# Patient Record
Sex: Male | Born: 1955 | Race: White | Hispanic: No | Marital: Married | Smoking: Former smoker
Health system: Southern US, Community
[De-identification: ages and names within clinical notes are randomized; demographics above are authoritative.]

## PROBLEM LIST (undated history)

## (undated) DIAGNOSIS — I10 Essential (primary) hypertension: Secondary | ICD-10-CM

## (undated) DIAGNOSIS — R0789 Other chest pain: Secondary | ICD-10-CM

## (undated) DIAGNOSIS — E785 Hyperlipidemia, unspecified: Secondary | ICD-10-CM

## (undated) DIAGNOSIS — Z9289 Personal history of other medical treatment: Secondary | ICD-10-CM

## (undated) DIAGNOSIS — N529 Male erectile dysfunction, unspecified: Secondary | ICD-10-CM

## (undated) DIAGNOSIS — Z9989 Dependence on other enabling machines and devices: Secondary | ICD-10-CM

## (undated) DIAGNOSIS — G4733 Obstructive sleep apnea (adult) (pediatric): Secondary | ICD-10-CM

## (undated) HISTORY — DX: Dependence on other enabling machines and devices: Z99.89

## (undated) HISTORY — DX: Male erectile dysfunction, unspecified: N52.9

## (undated) HISTORY — DX: Other chest pain: R07.89

## (undated) HISTORY — DX: Personal history of other medical treatment: Z92.89

## (undated) HISTORY — DX: Obstructive sleep apnea (adult) (pediatric): G47.33

## (undated) HISTORY — DX: Essential (primary) hypertension: I10

## (undated) HISTORY — DX: Hyperlipidemia, unspecified: E78.5

---

## 2002-06-17 LAB — PULMONARY FUNCTION TEST

## 2005-01-09 ENCOUNTER — Ambulatory Visit (HOSPITAL_COMMUNITY): Admission: RE | Admit: 2005-01-09 | Discharge: 2005-01-09 | Payer: Self-pay | Admitting: Family Medicine

## 2009-11-07 ENCOUNTER — Ambulatory Visit (HOSPITAL_COMMUNITY): Admission: RE | Admit: 2009-11-07 | Discharge: 2009-11-07 | Payer: Self-pay | Admitting: General Surgery

## 2010-08-03 NOTE — Procedures (Signed)
NAMERAYNARD, MAPPS             ACCOUNT NO.:  [id number]   MEDICAL RECORD NO.:  [id number]          PATIENT TYPE:  OUT   LOCATION:                                FACILITY:  [facility]   PHYSICIAN:  Donna Bernard, M.D.DATE OF BIRTH:  [birth year]   DATE OF PROCEDURE:  DATE OF DISCHARGE:                                    STRESS TEST   DATE OF TEST:  January 09, 2005.   INDICATION FOR TEST:  Atypical chest pain and exertional dyspnea with  multiple risk factors.   STRESS TEST:  The stress test was performed at standard [facility] protocol.  Resting EKG revealed normal sinus rhythm with no significant ST-T changes.  The patient tolerated the first three stages quite well.  As he reached the  third stage, his heart rate surpassed his submax predicted heart rate and  achieved a maximum of 154.  At this point, at 0.08 seconds past the J point,  there was ST segment depression in II, III and AVF; however, the slope of  the segment was sharply ascending.  The patient actually made it into the  fourth stage.  He had normal hypertensive responses expected.  During rest,  the atypical ST segment changes returned to normal quickly.  The patient  experienced no chest pain or excessive dyspnea with the test.   IMPRESSION:  Negative adequate stress test.   PLAN:  Exercise encouraged.      Donna Bernard, M.D.  Electronically Signed     WSL/MEDQ  D:  01/30/2005  T:  01/30/2005  Job:  [id number]

## 2011-10-04 DIAGNOSIS — Z9289 Personal history of other medical treatment: Secondary | ICD-10-CM

## 2011-10-04 HISTORY — DX: Personal history of other medical treatment: Z92.89

## 2012-09-25 ENCOUNTER — Encounter: Payer: Self-pay | Admitting: Cardiovascular Disease

## 2012-09-25 ENCOUNTER — Encounter: Payer: Self-pay | Admitting: *Deleted

## 2012-09-28 ENCOUNTER — Encounter: Payer: Self-pay | Admitting: Cardiovascular Disease

## 2012-09-28 ENCOUNTER — Ambulatory Visit (INDEPENDENT_AMBULATORY_CARE_PROVIDER_SITE_OTHER): Payer: BC Managed Care – HMO | Admitting: Cardiovascular Disease

## 2012-09-28 VITALS — BP 138/88 | Ht 69.0 in | Wt 246.0 lb

## 2012-09-28 DIAGNOSIS — E8881 Metabolic syndrome: Secondary | ICD-10-CM

## 2012-09-28 DIAGNOSIS — E669 Obesity, unspecified: Secondary | ICD-10-CM | POA: Insufficient documentation

## 2012-09-28 DIAGNOSIS — Z8249 Family history of ischemic heart disease and other diseases of the circulatory system: Secondary | ICD-10-CM

## 2012-09-28 DIAGNOSIS — E782 Mixed hyperlipidemia: Secondary | ICD-10-CM

## 2012-09-28 DIAGNOSIS — G4733 Obstructive sleep apnea (adult) (pediatric): Secondary | ICD-10-CM

## 2012-09-28 DIAGNOSIS — R0789 Other chest pain: Secondary | ICD-10-CM

## 2012-09-28 DIAGNOSIS — Z9989 Dependence on other enabling machines and devices: Secondary | ICD-10-CM

## 2012-09-28 MED ORDER — SIMVASTATIN 20 MG PO TABS: 20.0000 mg | ORAL_TABLET | Freq: Every evening | ORAL | Status: DC

## 2012-09-28 NOTE — Progress Notes (Signed)
Patient ID: SAFIR Stephen Walton, male   DOB: 1955/06/07, 57 y.o.   MRN: [id number]     HPI: Stephen Walton, is a 57 y.o. male who presents for followup chronologic evaluation. 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 He has a history of obesity, sleep apnea on CPAP therapy, and last summer developed episodes of chest pain with some atypical features. This occurred shortly after his brother suffered a cardiac arrest. An echo Doppler study revealed mild concentric LVH with normal systolic and diastolic function. He did have mild aortic valve sclerosis without stenosis. A nuclear perfusion study done on 10/04/2011 showed normal perfusion.  He is felt to have probable metabolic syndrome. I did perform an NMR profile in March 2014 which showed an increase LDL particle number at 1647 with an LDL of 132. He had 736 small LDL particles. His insulin resistance was 53. HDL was 53. Normal HDL particle #37.2. He has been on simvastatin 20 mg since that time. Subsequent blood work showed marked improvement with a total cholesterol 146 triglycerides 114 HDL 49 LDL cholesterol 74. [name] walks at least 5 miles daily. However, despite this has been difficult for him  to lose weight. He denies recurrent chest pain. He is in need for a new supply company for CPAP treatment.  Past Medical History  Diagnosis Date  . OSA on CPAP   . H/O echocardiogram 10/04/2011    EF >55%;mild concentric LVH; normals systolic & diastolic dysfunction; mild aortic stenosis  . History of nuclear stress test 10/04/2011    low risk; small amount of basal inferior bowel artifact  . Chest pain, atypical   . Hyperlipidemia     No past surgical history on file.  No Known Allergies  Current Outpatient Prescriptions  Medication Sig Dispense Refill  . aspirin 81 MG tablet Take 81 mg by mouth daily.      . fish oil-omega-3 fatty acids 1000  MG capsule Take 1 g by mouth daily.      . NON FORMULARY at bedtime. CPAP      . simvastatin (ZOCOR) 20 MG tablet Take 20 mg by mouth every evening.      . TESTOSTERONE IM Inject into the muscle every 21 ( twenty-one) days.       No current facility-administered medications for this visit.    Socially  ROS is negative for fevers, chills or night sweats.   Other system review is negative.  PE BP 138/88  Ht 5\' 9"  (1.753 m)  Wt 246 lb (111.585 kg)  BMI 36.31 kg/m2  General: Alert, oriented, no distress.  Skin: normal turgor, no rashes HEENT: Normocephalic, atraumatic. Pupils round and reactive; sclera anicteric;no lid lag.  Nose without nasal septal hypertrophy Mouth/Parynx benign; Mallinpatti scale 3 Neck: No JVD, no carotid briuts Lungs: clear to ausculatation and percussion; no wheezing or rales Heart: RRR, s1 s2 normal;1/6 systolic murmur Abdomen: soft, nontender; no hepatosplenomehaly, BS+; abdominal aorta nontender and not dilated by palpation. Pulses 2+ Extremities: no clubbing cyanosis or edema, Homan's sign negative  Neurologic: grossly nonfocal  ECG: Sinus rhythm at 61 beats per minute; normal intervals  LABS:  BMET No results found for this basename: na, k, cl, co2, glucose, bun, creatinine, calcium, gfrnonaa, gfraa     Hepatic Function Panel  No results found for this basename: prot, albumin, ast, alt, alkphos, bilitot, bilidir, ibili     CBC  No results found for this basename: wbc, rbc, hgb, hct, plt, mcv, mch, mchc, rdw, neutrabs, lymphsabs, monoabs, eosabs, basosabs     BNP No results found for this basename: probnp    Lipid Panel  No results found for this basename: chol, trig, hdl, cholhdl, vldl, ldlcalc     RADIOLOGY: No results found.    ASSESSMENT AND PLAN: [name] continues to do well. He has demonstrated significant improvement in his lipid studies with the initiation of low-dose simvastatin at 20 mg per he does have insulin  resistance and metabolic syndrome. We discussed again further attempts at weight loss. I given him contact information and CPAP supplies and he will initiate this with home oxygen since his daughter had worked with the home oxygen wrap, may consider [employer] [facility] homecare he has been working with a nutritionist the ostium in 6 months for followup evaluation.     Stephen Bihari, MD, [facility]  09/28/2012 12:15 PM

## 2012-09-28 NOTE — Patient Instructions (Addendum)
Your physician recommends that you schedule a follow-up appointment in: 6 MONTHS. No changes has been made in your therapy today. 

## 2012-12-28 ENCOUNTER — Telehealth: Payer: Self-pay | Admitting: Pharmacist Clinician (PhC)/ Clinical Pharmacy Specialist

## 2012-12-28 NOTE — Telephone Encounter (Signed)
Opened in error

## 2013-04-26 ENCOUNTER — Ambulatory Visit (INDEPENDENT_AMBULATORY_CARE_PROVIDER_SITE_OTHER): Payer: BC Managed Care – PPO | Admitting: Cardiovascular Disease

## 2013-04-26 ENCOUNTER — Encounter: Payer: Self-pay | Admitting: Cardiovascular Disease

## 2013-04-26 VITALS — BP 112/80 | HR 69 | Ht 69.0 in | Wt 250.6 lb

## 2013-04-26 DIAGNOSIS — Z9989 Dependence on other enabling machines and devices: Principal | ICD-10-CM

## 2013-04-26 DIAGNOSIS — G4733 Obstructive sleep apnea (adult) (pediatric): Secondary | ICD-10-CM

## 2013-04-26 DIAGNOSIS — E669 Obesity, unspecified: Secondary | ICD-10-CM

## 2013-04-26 DIAGNOSIS — E785 Hyperlipidemia, unspecified: Secondary | ICD-10-CM

## 2013-04-26 DIAGNOSIS — E8881 Metabolic syndrome: Secondary | ICD-10-CM

## 2013-04-26 NOTE — Patient Instructions (Signed)
No changes were made today in your therapy.  Your physician recommends that you schedule a follow-up appointment in: 6 months.

## 2013-04-30 ENCOUNTER — Encounter: Payer: Self-pay | Admitting: Cardiovascular Disease

## 2013-04-30 DIAGNOSIS — E785 Hyperlipidemia, unspecified: Secondary | ICD-10-CM | POA: Insufficient documentation

## 2013-04-30 NOTE — Progress Notes (Signed)
Patient ID: HAWLEY MICHEL, male   DOB: 05-Sep-1955, 58 y.o.   MRN: [id number]      HPI: CONCEPCION KIRKPATRICK, is a 58 y.o. male who presents for followup 6 month cardiology evaluation.   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 He has a history of obesity, sleep apnea on CPAP therapy, and lin 2013 developed episodes of chest pain with some atypical features. This occurred shortly after his brother suffered a cardiac arrest. An echo Doppler study revealed mild concentric LVH with normal systolic and diastolic function. He did have mild aortic valve sclerosis without stenosis. A nuclear perfusion study done on 10/04/2011 showed normal perfusion.  He is felt to have probable metabolic syndrome. In March 2014 an NMR lipoprofile showed an increase LDL particle number at 1647 with an LDL of 132. He had 736 small LDL particles. His insulin resistance was 53. HDL was 53. Normal HDL particle #37.2. He has been on simvastatin 20 mg since that time. Subsequent blood work showed marked improvement with a total cholesterol 146 triglycerides 114 HDL 49 LDL cholesterol 74. Mr. Vonbargen walks at least 5 miles daily. However, despite this has been difficult for him  to lose weight. He denies recurrent chest pain.   When I saw him last year, he was continuing to do well he now uses [facility] for his CPAP supply company after [employer]). He is unaware of breakthrough snoring. He denies residual daytime sleepiness. He is unaware bruxism.  Past 2 years, he has gained approximately 10 additional pounds of weight despite his exercise. He has had recent laboratory done by Dr. Orson Ape in [place]. He denies recurrent episodes of chest pain. He denies palpitations. He denies presyncope or syncope.  Past Medical History  Diagnosis Date  . OSA on CPAP   . H/O echocardiogram 10/04/2011    EF >55%;mild concentric LVH; normals systolic & diastolic  dysfunction; mild aortic stenosis  . History of nuclear stress test 10/04/2011    low risk; small amount of basal inferior bowel artifact  . Chest pain, atypical   . Hyperlipidemia     History reviewed. No pertinent past surgical history.  No Known Allergies  Current Outpatient Prescriptions  Medication Sig Dispense Refill  . aspirin 81 MG tablet Take 81 mg by mouth daily.      . fish oil-omega-3 fatty acids 1000 MG capsule Take 1 g by mouth daily.      . NON FORMULARY at bedtime. CPAP      . simvastatin (ZOCOR) 20 MG tablet Take 1 tablet (20 mg total) by mouth every evening.  90 tablet  3  . TESTOSTERONE IM Inject into the muscle every 21 ( twenty-one) days.       No current facility-administered medications for this visit.    Socially he is married has 4 children. 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 There is no tobacco use having quit over 30 years ago. There is nosignificant alcohol use  ROS is negative for fevers, chills or night sweats. He admits to a 10 pound weight gain over the past 2 years. He denies change in vision or hearing. He is unaware lymphadenopathy. He denies any further chest tightness. There is no wheezing. He denies presyncope or syncope. Denies cough or increased congestion. He denies nausea vomiting or diarrhea. There is no blood in stool or urine. He denies myalgias. There are no claudication symptoms.  He is not diabetic but has metabolic syndrome. There is no tremors. There is no history of thyroid abnormality. He has been utilizing CPAP therapy without breakthrough snoring or residual daytime sleepiness.  Other comprehensive 14 point system review is negative.  PE BP 112/80  Pulse 69  Ht 5\' 9"  (1.753 m)  Wt 250 lb 9.6 oz (113.671 kg)  BMI 36.99 kg/m2  General: Alert, oriented, no distress. Moderately obese Skin: normal turgor, no rashes HEENT: Normocephalic, atraumatic. Pupils round and reactive; sclera anicteric;no lid lag. No  xanthelasma Nose without nasal septal hypertrophy Mouth/Parynx benign; Mallinpatti scale 3 Neck: No JVD, no carotid bruits with normal carotid upstroke Lungs: clear to ausculatation and percussion; no wheezing or rales Heart: RRR, s1 s2 [id number];5/6 systolic murmur; no diastolic murmur, rubs thrills or heaves Abdomen: soft, nontender; no hepatosplenomehaly, BS+; abdominal aorta nontender and not dilated by palpation. Pulses 2+ Extremities: no clubbing cyanosis or edema, Homan's sign negative  Neurologic: grossly nonfocal; cranial nerves normal Psychological: Normal affect and mood  ECG (independently read by me): Normal sinus rhythm at 69 beats per minute. No ectopy. Normal intervals.  Prior ECG of 09/28/2012: Sinus rhythm at 61 beats per minute; normal intervals  LABS:  BMET No results found for this basename: na,  k,  cl,  co2,  glucose,  bun,  creatinine,  calcium,  gfrnonaa,  gfraa     Hepatic Function Panel  No results found for this basename: prot,  albumin,  ast,  alt,  alkphos,  bilitot,  bilidir,  ibili     CBC No results found for this basename: wbc,  rbc,  hgb,  hct,  plt,  mcv,  mch,  mchc,  rdw,  neutrabs,  lymphsabs,  monoabs,  eosabs,  basosabs     BNP No results found for this basename: probnp    Lipid Panel  No results found for this basename: chol,  trig,  hdl,  cholhdl,  vldl,  ldlcalc     RADIOLOGY: No results found.    ASSESSMENT AND PLAN: Mr. 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 Weight has gained approximately 10 pounds over the past 2 years. He did see a nutritionist in the past. He does have metabolic syndrome. Presently, his blood pressure is well controlled and on repeat by me today was 120/78. He is on simvastatin 20 mg and fish oil 1000 mg daily and this has resulted in significant improvement in his lipid parameters. He's not having any further  episodes of chest pain which in the past was atypical and noncardiac. He had normal perfusion on nuclear perfusion study in July 2013. Had a long discussion with him today concerning proper diet, with reduction in carbohydrates and also discussed Mediterranean diet with him. We discussed weight: 6 months of 225 pounds with an average of 1 pound per week. This was obstructive sleep apnea, he is now using [facility] for CPAP supplies. He is compliant. He's not having significant issues with this and there is no residual daytime sleepiness or breakthrough snoring. I will obtain recent laboratory data done in [place]. I will see him in 6 months for cardiology reevaluation.  Time spent: 25 minutes  Troy Sine, MD, [facility]  04/30/2013 7:42 AM

## 2013-11-23 ENCOUNTER — Ambulatory Visit (INDEPENDENT_AMBULATORY_CARE_PROVIDER_SITE_OTHER): Payer: BC Managed Care – PPO | Admitting: Urology

## 2013-11-23 DIAGNOSIS — R972 Elevated prostate specific antigen [PSA]: Secondary | ICD-10-CM

## 2013-11-23 DIAGNOSIS — E291 Testicular hypofunction: Secondary | ICD-10-CM

## 2013-12-01 ENCOUNTER — Other Ambulatory Visit: Payer: Self-pay | Admitting: Urology

## 2013-12-01 DIAGNOSIS — R972 Elevated prostate specific antigen [PSA]: Secondary | ICD-10-CM

## 2013-12-02 ENCOUNTER — Other Ambulatory Visit: Payer: Self-pay | Admitting: Urology

## 2013-12-02 DIAGNOSIS — R972 Elevated prostate specific antigen [PSA]: Secondary | ICD-10-CM

## 2013-12-13 ENCOUNTER — Other Ambulatory Visit: Payer: Self-pay | Admitting: Cardiovascular Disease

## 2013-12-13 NOTE — Telephone Encounter (Signed)
Rx was sent to pharmacy electronically. 

## 2013-12-14 ENCOUNTER — Ambulatory Visit (HOSPITAL_COMMUNITY)
Admission: RE | Admit: 2013-12-14 | Discharge: 2013-12-14 | Disposition: A | Discharge status: Home / Self Care | Payer: BC Managed Care – PPO | Source: Ambulatory Visit | Attending: Urology | Admitting: Urology

## 2013-12-14 ENCOUNTER — Encounter (HOSPITAL_COMMUNITY): Payer: Self-pay

## 2013-12-14 VITALS — BP 151/100 | HR 70 | Temp 98.1°F | Resp 16

## 2013-12-14 DIAGNOSIS — R972 Elevated prostate specific antigen [PSA]: Secondary | ICD-10-CM | POA: Diagnosis present

## 2013-12-14 DIAGNOSIS — C61 Malignant neoplasm of prostate: Secondary | ICD-10-CM | POA: Diagnosis not present

## 2013-12-14 MED ORDER — GENTAMICIN SULFATE 40 MG/ML IJ SOLN
160.0000 mg | Freq: Once | INTRAMUSCULAR | Status: AC
  Administered 2013-12-14: 160 mg via INTRAMUSCULAR
  Filled 2013-12-14: qty 4

## 2013-12-14 MED ORDER — GENTAMICIN SULFATE 40 MG/ML IJ SOLN
INTRAMUSCULAR | Status: AC
  Administered 2013-12-14: 160 mg via INTRAMUSCULAR
  Filled 2013-12-14: qty 4

## 2013-12-14 MED ORDER — LIDOCAINE HCL (PF) 2 % IJ SOLN
10.0000 mL | Freq: Once | INTRAMUSCULAR | Status: AC
  Administered 2013-12-14: 10 mL

## 2013-12-14 MED ORDER — LIDOCAINE HCL (PF) 2 % IJ SOLN
INTRAMUSCULAR | Status: AC
  Administered 2013-12-14: 10 mL
  Filled 2013-12-14: qty 10

## 2013-12-14 NOTE — Discharge Instructions (Signed)
Transrectal Ultrasound-Guided Biopsy °A transrectal ultrasound-guided biopsy is a procedure to remove samples of tissue from your prostate using ultrasound images to guide the procedure. The procedure is usually done to evaluate the prostate gland of men who have an elevated prostate-specific antigen (PSA). PSA is a blood test to screen for prostate cancer. The biopsy samples are taken to check for prostate cancer.  °LET YOUR HEALTH CARE PROVIDER KNOW ABOUT: °· Any allergies you have. °· All medicines you are taking, including vitamins, herbs, eye drops, creams, and over-the-counter medicines. °· Previous problems you or members of your family have had with the use of anesthetics. °· Any blood disorders you have. °· Previous surgeries you have had. °· Medical conditions you have. °RISKS AND COMPLICATIONS °Generally, this is a safe procedure. However, as with any procedure, problems can occur. Possible problems include: °· Infection of your prostate. °· Bleeding from your rectum or blood in your urine. °· Difficulty urinating. °· Nerve damage (this is usually temporary). °· Damage to surrounding structures such as blood vessels, organs, and muscles, which would require other procedures. °BEFORE THE PROCEDURE °· Do not eat or drink anything after midnight on the night before the procedure or as directed by your health care provider. °· Take medicines only as directed by your health care provider. °· Your health care provider may have you stop taking certain medicines 5-7 days before the procedure. °· You will be given an enema before the procedure. During an enema, a liquid is injected into your rectum to clear out waste. °· You may have lab tests the day of your procedure.   °· Plan to have someone take you home after the procedure. °PROCEDURE  °· You will be given medicine to help you relax (sedative) before the procedure. An IV tube will be inserted into one of your veins and used to give fluids and  medicine. °· You will be given antibiotic medicine to reduce the risk of an infection. °· You will be placed on your side for the procedure. °· A probe with lubricated gel will be placed into your rectum, and images will be taken of your prostate and surrounding structures. °· Numbing medicine will be injected into the prostate before the biopsy samples are taken. °· A biopsy needle will then be inserted and guided to your prostate with the use of the ultrasound images. °· Samples of prostate tissue will be taken, and the needle will then be removed. °· The biopsy samples will be sent to a lab to be analyzed. Results are usually back in 2-3 days. °AFTER THE PROCEDURE °· You will be taken to a recovery area where you will be monitored. °· You may have some discomfort in the rectal area. You will be given pain medicines to control this. °· You may be allowed to go home the same day, or you may need to stay in the hospital overnight. °Document Released: 07/19/2013 Document Reviewed: 10/21/2012 °ExitCare® Patient Information ©2015 [employer]. This information is not intended to replace advice given to you by your health care provider. Make sure you discuss any questions you have with your health care provider. ° °

## 2013-12-16 ENCOUNTER — Encounter: Payer: Self-pay | Admitting: Cardiovascular Disease

## 2013-12-16 ENCOUNTER — Ambulatory Visit (INDEPENDENT_AMBULATORY_CARE_PROVIDER_SITE_OTHER): Payer: BC Managed Care – PPO | Admitting: Cardiovascular Disease

## 2013-12-16 VITALS — BP 130/70 | HR 77 | Ht 69.0 in | Wt 248.1 lb

## 2013-12-16 DIAGNOSIS — G4733 Obstructive sleep apnea (adult) (pediatric): Secondary | ICD-10-CM

## 2013-12-16 DIAGNOSIS — E669 Obesity, unspecified: Secondary | ICD-10-CM

## 2013-12-16 DIAGNOSIS — F419 Anxiety disorder, unspecified: Secondary | ICD-10-CM

## 2013-12-16 DIAGNOSIS — E785 Hyperlipidemia, unspecified: Secondary | ICD-10-CM

## 2013-12-16 DIAGNOSIS — E8881 Metabolic syndrome: Secondary | ICD-10-CM

## 2013-12-16 DIAGNOSIS — Z9989 Dependence on other enabling machines and devices: Principal | ICD-10-CM

## 2013-12-16 NOTE — Patient Instructions (Signed)
Your physician wants you to follow-up in: 1 year or sooner if needed. You will receive a reminder letter in the mail two months in advance. If you don't receive a letter, please call our office to schedule the follow-up appointment. No changes were made today in your therapy.

## 2013-12-18 ENCOUNTER — Encounter: Payer: Self-pay | Admitting: Cardiovascular Disease

## 2013-12-18 DIAGNOSIS — F419 Anxiety disorder, unspecified: Secondary | ICD-10-CM | POA: Insufficient documentation

## 2013-12-18 NOTE — Progress Notes (Signed)
Patient ID: JONA ZAPPONE, male   DOB: [birth year], 58 y.o.   MRN: [id number]      HPI: SUN WILENSKY is a 58 y.o. male who presents for follow-up 15  month cardiology evaluation.   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 He has a history of obesity, sleep apnea on CPAP therapy, and in 2013 developed episodes of chest pain with some atypical features. This occurred shortly after his brother suffered a cardiac arrest. An echo Doppler study revealed mild concentric LVH with normal systolic and diastolic function. He did have mild aortic valve sclerosis without stenosis. A nuclear perfusion study done on 10/04/2011 showed normal perfusion.  He is felt to have metabolic syndrome. In March 2014 an NMR lipoprofile showed an increase LDL particle number at 1647 with an LDL of 132. He had 736 small LDL particles. His insulin resistance was 53. HDL was 53. Normal HDL particle #37.2. He has been on simvastatin 20 mg since that time. Subsequent blood work showed marked improvement with a total cholesterol 146 triglycerides 114 HDL 49 LDL cholesterol 74. Mr. Sellin walks at least 5 miles daily. However, despite this has been difficult for him  to lose weight. He denies recurrent chest pain.   He admits to 100% CPAP use anow uses [employer] as his MDE company in [place].  He is the father of [name], who previously had worked for [employer] in [place].  Over the past 2 years, he has gained approximately 10 additional pounds of weight despite his exercise. He has had recent laboratory done by Dr. Orson Ape in [place]. He denies recurrent episodes of chest pain. He denies palpitations. He denies presyncope or syncope.  Approximate 5 weeks ago, his brother died at age 46 with a myocardial infarction.  Yesterday he underwent a prostate biopsy by Dr. Reita May and was told that his prostate was enlarged.  He's awaiting pathology.  Past Medical History  Diagnosis Date  . OSA on CPAP   . H/O  echocardiogram 10/04/2011    EF >55%;mild concentric LVH; normals systolic & diastolic dysfunction; mild aortic stenosis  . History of nuclear stress test 10/04/2011    low risk; small amount of basal inferior bowel artifact  . Chest pain, atypical   . Hyperlipidemia     History reviewed. No pertinent past surgical history.  No Known Allergies  Current Outpatient Prescriptions  Medication Sig Dispense Refill  . buPROPion (WELLBUTRIN XL) 150 MG 24 hr tablet Take 1 tablet by mouth at bedtime.      Marland Kitchen escitalopram (LEXAPRO) 10 MG tablet Take 1 tablet by mouth daily.      . fish oil-omega-3 fatty acids 1000 MG capsule Take 1 g by mouth daily.      . NON FORMULARY at bedtime. CPAP      . simvastatin (ZOCOR) 20 MG tablet TAKE (1) TABLET BY MOUTH AT BEDTIME.  90 tablet  0  . TESTOSTERONE IM Inject into the muscle every 21 ( twenty-one) days.       No current facility-administered medications for this visit.    Socially he is married has 4 children. 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 There is no tobacco use having quit over 30 years ago. There is nosignificant alcohol use  ROS General: Negative; No fevers, chills, or night sweats;  HEENT: Negative; No changes in vision or hearing, sinus congestion, difficulty swallowing Pulmonary: Negative; No cough, wheezing, shortness of breath, hemoptysis Cardiovascular: Negative; No chest pain,  presyncope, syncope, palpitations GI: Negative; No nausea, vomiting, diarrhea, or abdominal pain GU: Enlarged prostate, status post biopsy; No dysuria, hematuria, or difficulty voiding Musculoskeletal: Negative; no myalgias, joint pain, or weakness Hematologic/Oncology: Negative; no easy bruising, bleeding Endocrine: Positive for metabolic syndrome; no heat/cold intolerance; no diabetes Neuro: Negative; no changes in balance, headaches Skin: Negative; No rashes or skin lesions Psychiatric: Negative; No behavioral problems, depression Sleep:  Positive for obstructive sleep apnea on CPAP with 100% compliance; No snoring, daytime sleepiness, hypersomnolence, bruxism, restless legs, hypnogognic hallucinations, no cataplexy Other comprehensive 14 point system review is negative.    PE BP 130/70  Pulse 77  Ht 5\' 9"  (1.753 m)  Wt 248 lb 1.6 oz (112.537 kg)  BMI 36.62 kg/m2  General: Alert, oriented, no distress. 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 Pupils round and reactive; sclera anicteric;no lid lag. No xanthelasma Nose without nasal septal hypertrophy Mouth/Parynx benign; Mallinpatti scale 3 Neck: No JVD, no carotid bruits with normal carotid upstroke Lungs: clear to ausculatation and percussion; no wheezing or rales Heart: RRR, s1 s2 [id number];6/6 systolic murmur; no diastolic murmur, rubs thrills or heaves Abdomen: soft, nontender; no hepatosplenomehaly, BS+; abdominal aorta nontender and not dilated by palpation. Pulses 2+ Extremities: no clubbing cyanosis or edema, Homan's sign negative  Neurologic: grossly nonfocal; cranial nerves normal Psychological: Normal affect and mood  ECG (independently read by me): Normal sinus rhythm at 77 beats per minute. No ectopy. Normal intervals.  Prior ECG of 09/28/2012: Sinus rhythm at 61 beats per minute; normal intervals  LABS:  BMET No results found for this basename: na,  k,  cl,  co2,  glucose,  bun,  creatinine,  calcium,  gfrnonaa,  gfraa     Hepatic Function Panel  No results found for this basename: prot,  albumin,  ast,  alt,  alkphos,  bilitot,  bilidir,  ibili     CBC No results found for this basename: wbc,  rbc,  hgb,  hct,  plt,  mcv,  mch,  mchc,  rdw,  neutrabs,  lymphsabs,  monoabs,  eosabs,  basosabs     BNP No results found for this basename: probnp    Lipid Panel  No results found for this  basename: chol,  trig,  hdl,  cholhdl,  vldl,  ldlcalc     RADIOLOGY: No results found.    ASSESSMENT AND PLAN: Mr. 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  His weight today remains at 248, which corresponds to a body mass index of 36.6.  He continues to walk at least 5 miles per day with his [place].  Surveillance and despite this is having difficulty with weight loss.  I again had long discussion with him concerning proper food intake particularly wit assessing the glycemic indices of foods and reducing carbohydrate intake.  It may be worthwhile for him in the future to see the nutritionist if he is still unsuccessful.  He does admit to some increased anxiety and depression following his brother's recent death at age 46.  He denies any episodes of chest tightness or exertional shortness of breath.  A prior nuclear perfusion study had revealed normal perfusion.  I did review with him laboratory data he had done 6 months ago.  Lipid panel remained excellent with a total cholesterol 159,  LDL 88, triglycerides 97, HDL 52.  He is tolerating simvastatin at 20 mg without side effects.  He now is on both Wellbutrin and Lexapro for his anxiety/depression.  TSH was normal at 1.1.  3.  His PSA had risen to 4.0, and he underwent biopsy yesterday.  As long as he remains stable, I will see him in one year for followup evaluation or sooner if problems arise.   Time spent: 25 minutes  Troy Sine, MD, [facility]  12/18/2013 10:52 AM

## 2014-01-03 ENCOUNTER — Ambulatory Visit: Payer: BC Managed Care – PPO | Admitting: Cardiovascular Disease

## 2014-02-15 ENCOUNTER — Other Ambulatory Visit: Payer: Self-pay | Admitting: Urology

## 2014-02-15 ENCOUNTER — Ambulatory Visit: Payer: BC Managed Care – PPO | Admitting: Urology

## 2014-02-15 DIAGNOSIS — C61 Malignant neoplasm of prostate: Secondary | ICD-10-CM

## 2014-03-22 ENCOUNTER — Other Ambulatory Visit: Payer: Self-pay | Admitting: *Deleted

## 2014-03-22 MED ORDER — SIMVASTATIN 20 MG PO TABS: ORAL_TABLET | ORAL | Status: DC

## 2014-05-17 ENCOUNTER — Ambulatory Visit (HOSPITAL_COMMUNITY)
Admission: RE | Admit: 2014-05-17 | Discharge: 2014-05-17 | Disposition: A | Discharge status: Home / Self Care | Payer: BLUE CROSS/BLUE SHIELD | Source: Ambulatory Visit | Attending: Urology | Admitting: Urology

## 2014-05-17 DIAGNOSIS — C61 Malignant neoplasm of prostate: Secondary | ICD-10-CM | POA: Insufficient documentation

## 2014-05-17 MED ORDER — GADOBENATE DIMEGLUMINE 529 MG/ML IV SOLN
20.0000 mL | Freq: Once | INTRAVENOUS | Status: AC | PRN
  Administered 2014-05-17: 20 mL via INTRAVENOUS

## 2014-05-18 ENCOUNTER — Other Ambulatory Visit: Payer: Self-pay | Admitting: Urology

## 2014-05-18 DIAGNOSIS — C61 Malignant neoplasm of prostate: Secondary | ICD-10-CM

## 2014-05-24 ENCOUNTER — Ambulatory Visit (HOSPITAL_COMMUNITY)
Admission: RE | Admit: 2014-05-24 | Discharge: 2014-05-24 | Disposition: A | Discharge status: Home / Self Care | Payer: BLUE CROSS/BLUE SHIELD | Source: Ambulatory Visit | Attending: Urology | Admitting: Urology

## 2014-05-24 DIAGNOSIS — C61 Malignant neoplasm of prostate: Secondary | ICD-10-CM | POA: Diagnosis not present

## 2014-05-24 DIAGNOSIS — Z08 Encounter for follow-up examination after completed treatment for malignant neoplasm: Secondary | ICD-10-CM | POA: Insufficient documentation

## 2014-05-24 MED ORDER — LIDOCAINE HCL (PF) 2 % IJ SOLN
INTRAMUSCULAR | Status: AC
  Administered 2014-05-24: 10 mL
  Filled 2014-05-24: qty 10

## 2014-05-24 MED ORDER — GENTAMICIN SULFATE 40 MG/ML IJ SOLN
INTRAMUSCULAR | Status: AC
  Administered 2014-05-24: 160 mg via INTRAMUSCULAR
  Filled 2014-05-24: qty 4

## 2014-05-24 MED ORDER — LIDOCAINE HCL (PF) 2 % IJ SOLN
10.0000 mL | Freq: Once | INTRAMUSCULAR | Status: AC
  Administered 2014-05-24: 10 mL

## 2014-05-24 MED ORDER — GENTAMICIN SULFATE 40 MG/ML IJ SOLN
160.0000 mg | Freq: Once | INTRAMUSCULAR | Status: AC
  Administered 2014-05-24: 160 mg via INTRAMUSCULAR

## 2014-05-24 MED ORDER — LIDOCAINE HCL (PF) 2 % IJ SOLN
INTRAMUSCULAR | Status: AC
  Filled 2014-05-24: qty 10

## 2014-05-24 NOTE — Discharge Instructions (Signed)
Transrectal Ultrasound-Guided Biopsy °A transrectal ultrasound-guided biopsy is a procedure to take samples of tissue from your prostate. Ultrasound images are used to guide the procedure. It is usually done to check the prostate gland for cancer. °BEFORE THE PROCEDURE °· Do not eat or drink after midnight on the night before your procedure. °· Take medicines as your doctor tells you. °· Your doctor may have you stop taking some medicines 5-7 days before the procedure. °· You will be given an enema before your procedure. During an enema, a liquid is put into your butt (rectum) to clear out waste. °· You may have lab tests the day of your procedure. °· Make plans to have someone drive you home. °PROCEDURE °· You will be given medicine to help you relax before the procedure. An IV tube will be put into one of your veins. It will be used to give fluids and medicine. °· You will be given medicine to reduce the risk of infection (antibiotic). °· You will be placed on your side. °· A probe with gel will be put in your butt. This is used to take pictures of your prostate and the area around it. °· A medicine to numb the area is put into your prostate. °· A biopsy needle is then inserted and guided to your prostate. °· Samples of prostate tissue are taken. The needle is removed. °· The samples are sent to a lab to be checked. Results are usually back in 2-3 days. °AFTER THE PROCEDURE °· You will be taken to a room where you will be watched until you are doing okay. °· You may have some pain in the area around your butt. You will be given medicines for this. °· You may be able to go home the same day. Sometimes, an overnight stay in the hospital is needed. °Document Released: 02/20/2009 Document Revised: 03/09/2013 Document Reviewed: 10/21/2012 °ExitCare® Patient Information ©2015 [employer]. This information is not intended to replace advice given to you by your health care provider. Make sure you discuss any questions  you have with your health care provider. ° °

## 2014-06-07 ENCOUNTER — Other Ambulatory Visit (HOSPITAL_COMMUNITY): Payer: BC Managed Care – PPO

## 2014-06-21 ENCOUNTER — Ambulatory Visit (INDEPENDENT_AMBULATORY_CARE_PROVIDER_SITE_OTHER): Payer: BLUE CROSS/BLUE SHIELD | Admitting: Urology

## 2014-06-21 DIAGNOSIS — C61 Malignant neoplasm of prostate: Secondary | ICD-10-CM

## 2014-12-12 ENCOUNTER — Encounter: Payer: Self-pay | Admitting: Cardiovascular Disease

## 2014-12-12 ENCOUNTER — Ambulatory Visit (INDEPENDENT_AMBULATORY_CARE_PROVIDER_SITE_OTHER): Payer: BLUE CROSS/BLUE SHIELD | Admitting: Cardiovascular Disease

## 2014-12-12 VITALS — BP 132/86 | HR 76 | Ht 69.0 in | Wt 240.3 lb

## 2014-12-12 DIAGNOSIS — E785 Hyperlipidemia, unspecified: Secondary | ICD-10-CM | POA: Diagnosis not present

## 2014-12-12 DIAGNOSIS — G4733 Obstructive sleep apnea (adult) (pediatric): Secondary | ICD-10-CM

## 2014-12-12 DIAGNOSIS — Z9989 Dependence on other enabling machines and devices: Secondary | ICD-10-CM

## 2014-12-12 DIAGNOSIS — E8881 Metabolic syndrome: Secondary | ICD-10-CM

## 2014-12-12 DIAGNOSIS — E669 Obesity, unspecified: Secondary | ICD-10-CM

## 2014-12-12 DIAGNOSIS — R0789 Other chest pain: Secondary | ICD-10-CM

## 2014-12-12 NOTE — Patient Instructions (Signed)
Your physician has recommended that you have a sleep study. This test records several body functions during sleep, including: brain activity, eye movement, oxygen and carbon dioxide blood levels, heart rate and rhythm, breathing rate and rhythm, the flow of air through your mouth and nose, snoring, body muscle movements, and chest and belly movement. This will be don at @ [place] [facility]. They will call you with appointment.  Your physician recommends that you schedule a follow-up appointment in: 4-5 months in sleep clinic.

## 2014-12-12 NOTE — Progress Notes (Signed)
Patient ID: Stephen Walton, male   DOB: [birth year], 59 y.o.   MRN: [id number]       HPI: Stephen Walton is a 59 y.o. male who presents for a follow-up one year cardiology evaluation.   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 He has a history of obesity, sleep apnea on CPAP therapy, and in 2013 developed episodes of chest pain with some atypical features. This occurred shortly after his brother suffered a cardiac arrest. An echo Doppler study revealed mild concentric LVH with normal systolic and diastolic function. He did have mild aortic valve sclerosis without stenosis. A nuclear perfusion study done on 10/04/2011 showed normal perfusion.  He has the metabolic syndrome. In March 2014 an NMR lipoprofile showed an increase LDL particle number at 1647 with an LDL of 132. He had 736 small LDL particles. His insulin resistance was 53. HDL was 53. Normal HDL particle #37.2. He has been on simvastatin 20 mg since that time. Subsequent blood work showed marked improvement with a total cholesterol 146 triglycerides 114 HDL 49 LDL cholesterol 74. Stephen Walton walks at least 5 miles daily. However, despite this has been difficult for him  to lose weight. He denies recurrent chest pain.   He admits to 100% CPAP use anow uses [employer] as his MDE company in [place].  He is the father of Stephen Walton, who previously had worked for [employer] in [place].  Over the past 2 years, he has gained approximately 10 additional pounds of weight despite his exercise. He has had recent laboratory done by Dr. Orson Walton in [place]. He denies recurrent episodes of chest pain. He denies palpitations. He denies presyncope or syncope.   When I saw him last year, his brother recently died with a myocardial infarction.  Over the past year, he had gained some weight but recently has lost 20 pounds.  For short-term.  He was  given phentermine, which assisted him in his weight loss.  By physician at [facility] and he has seen a nutritionist.  He continues to walk approximately 5 miles per day at work at [employer].  He had gained significant weight.  When he was placed on Lexapro for depression but due to this.  He stopped taking the Lexapro and lost 20 pounds as noted above.  He continues to use his CPAP with 100% compliance.  He states his machine is at least 59 years old.  At times it intermittently functions.  His daughter is Stephen Walton used to work in sleep medicine for sleep management solution.  She tried to obtain data from his machine, but there was no data available to retrieve.  He tells me he anticipates retiring in approximately a year from his current position.   He recently had blood work at [facility] by Dr. Annie Walton a Robby Sermon which I reviewed, which was taken in April 2016 and was entirely normal.  He presents for evaluation  Past Medical History  Diagnosis Date  . OSA on CPAP   . H/O echocardiogram 10/04/2011    EF >55%;mild concentric LVH; normals systolic & diastolic dysfunction; mild aortic stenosis  . History of nuclear stress test 10/04/2011    low risk; small amount of basal inferior bowel artifact  . Chest pain, atypical   . Hyperlipidemia     No past surgical history on file.  No Known Allergies  Current Outpatient Prescriptions  Medication Sig Dispense Refill  . aspirin 81 MG tablet Take 81 mg by mouth daily.    Marland Kitchen buPROPion (WELLBUTRIN XL) 150 MG 24 hr tablet Take 1 tablet by mouth at bedtime.    Marland Kitchen escitalopram (LEXAPRO) 10 MG tablet Take 1 tablet by mouth daily.    . fish oil-omega-3 fatty acids 1000 MG capsule Take 1 g by mouth daily.    . NON FORMULARY at bedtime. CPAP    . simvastatin (ZOCOR) 20 MG tablet TAKE (1) TABLET BY MOUTH AT BEDTIME. 90 tablet 3  . TESTOSTERONE IM Inject into the muscle every 21 ( twenty-one) days.     No current facility-administered medications for  this visit.    Socially he is married has 4 children. 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 There is no tobacco use having quit over 30 years ago. There is nosignificant alcohol use  ROS General: Negative; No fevers, chills, or night sweats;  HEENT: Negative; No changes in vision or hearing, sinus congestion, difficulty swallowing Pulmonary: Negative; No cough, wheezing, shortness of breath, hemoptysis Cardiovascular: Negative; No chest pain, presyncope, syncope, palpitations GI: Negative; No nausea, vomiting, diarrhea, or abdominal pain GU: Enlarged prostate, status post biopsy; No dysuria, hematuria, or difficulty voiding Musculoskeletal: Negative; no myalgias, joint pain, or weakness Hematologic/Oncology: Negative; no easy bruising, bleeding Endocrine: Positive for metabolic syndrome; no heat/cold intolerance; no diabetes Neuro: Negative; no changes in balance, headaches Skin: Negative; No rashes or skin lesions Psychiatric: Depression following his brother's death Sleep: Positive for obstructive sleep apnea on CPAP with 100% compliance; recent breakthrough snoring, no daytime sleepiness, hypersomnolence, bruxism, restless legs, hypnogognic hallucinations, no cataplexy Other comprehensive 14 point system review is negative.  PE BP 132/86 mmHg  Pulse 76  Ht 5\' 9"  (1.753 m)  Wt 240 lb 4.8 oz (108.999 kg)  BMI 35.47 kg/m2  General: Alert, oriented, no distress. 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 Pupils round and reactive; sclera anicteric;no lid lag. No xanthelasma Nose without nasal septal hypertrophy Mouth/Parynx benign; Mallinpatti scale 3 Neck: No JVD, no carotid bruits with normal carotid upstroke Lungs: clear to ausculatation and percussion; no  wheezing or rales Heart: RRR, s1 s2 URKYHC;6/2 systolic murmur; no diastolic murmur, rubs thrills or heaves Abdomen: soft, nontender; no hepatosplenomehaly, BS+; abdominal aorta nontender and not dilated by palpation. Pulses 2+ Extremities: no clubbing cyanosis or edema, Homan's sign negative  Neurologic: grossly nonfocal; cranial nerves normal Psychological: Normal affect and mood  ECG (independently read by me): NSR at 76; normal intervals  ECG (independently read by me): Normal sinus rhythm at 77 beats per minute. No ectopy. Normal intervals.  Prior ECG of 09/28/2012: Sinus rhythm at 61 beats per minute; normal intervals  LABS:  I reviewed recent blood work by Dr. Julianne Handler, M.D., from 07/14/2014.   Hemoglobin 17.3, hematocrit 51.6.  Fasting glucose 92.  BUN 17, currently 0.9.  Normal LFTs.  Total cluster 153, triglycerides 90, HDL 52, LDL 83.  PSA 2.1.  TSH 1.12.  BMET No results found for: NA   Hepatic Function Panel  No results found for: PROT   CBC No results found for: WBC   BNP No results found for: PROBNP  Lipid Panel  No results found for: CHOL   RADIOLOGY: No results found.    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  Since I saw him one year ago, he had seen a nutritionist and stopped taking Lexapro due to a 20 pound weight gain.  He was given a prescription for phentermine by his new primary physician.  Abdomen medical which he had taken short-term and no longer takes.  He [place] 5 [place]  He denies any exertional chest tightness.  His depression has improved.  He is unaware of palpitations.  I reviewed his recent blood work done by Dr. Robby Sermon in [place] earlier this year.  Stephen Walton has obstructive sleep apnea and has a CPAP unit that at least 59 years old.  His machine is at times not  functioning as well and apparently is not Office manager.  He does not have a definitive DME company that he works with, although in the past .  He had done his initial supplies from [employer] and subsequently from [employer] in [place].  With his weight change, and recent development of breakthrough snoring, I have recommended that he obtain a follow-up sleep study, which will be scheduled to be done.  It was a long hospital split-night protocol.  We will then be able to prescribe a new equipment and set him up with a DME company.  I will see him in a proximally 4-5 months for follow-up evaluation in a sleep clinic.  His blood pressure today is stable.  He continues to be on simvastatin 20 mg for hyperlipidemia.  He no longer is taking Lexapro for depression.  We discussed continued exercise and weight loss of at least 20 additional pounds if possible.  I will see him in 4-5 months for reevaluation.  Time spent: 25 minutes Troy Sine, MD, [facility]  12/12/2014 5:08 PM

## 2014-12-14 ENCOUNTER — Encounter: Payer: Self-pay | Admitting: Cardiovascular Disease

## 2014-12-15 ENCOUNTER — Encounter: Payer: Self-pay | Admitting: Cardiovascular Disease

## 2014-12-20 ENCOUNTER — Ambulatory Visit (INDEPENDENT_AMBULATORY_CARE_PROVIDER_SITE_OTHER): Payer: BLUE CROSS/BLUE SHIELD | Admitting: Urology

## 2014-12-20 DIAGNOSIS — C61 Malignant neoplasm of prostate: Secondary | ICD-10-CM | POA: Diagnosis not present

## 2014-12-20 DIAGNOSIS — E291 Testicular hypofunction: Secondary | ICD-10-CM | POA: Diagnosis not present

## 2014-12-20 DIAGNOSIS — N401 Enlarged prostate with lower urinary tract symptoms: Secondary | ICD-10-CM

## 2015-01-12 ENCOUNTER — Ambulatory Visit (HOSPITAL_BASED_OUTPATIENT_CLINIC_OR_DEPARTMENT_OTHER): Payer: BLUE CROSS/BLUE SHIELD | Attending: Cardiovascular Disease | Admitting: *Deleted

## 2015-01-12 VITALS — Ht 69.0 in | Wt 240.0 lb

## 2015-01-12 DIAGNOSIS — R0683 Snoring: Secondary | ICD-10-CM | POA: Diagnosis not present

## 2015-01-12 DIAGNOSIS — G478 Other sleep disorders: Secondary | ICD-10-CM | POA: Insufficient documentation

## 2015-01-12 DIAGNOSIS — G4733 Obstructive sleep apnea (adult) (pediatric): Secondary | ICD-10-CM | POA: Insufficient documentation

## 2015-01-12 DIAGNOSIS — F519 Sleep disorder not due to a substance or known physiological condition, unspecified: Secondary | ICD-10-CM | POA: Diagnosis not present

## 2015-01-12 DIAGNOSIS — Z9989 Dependence on other enabling machines and devices: Secondary | ICD-10-CM

## 2015-01-23 ENCOUNTER — Other Ambulatory Visit: Payer: Self-pay | Admitting: Cardiovascular Disease

## 2015-02-05 NOTE — Sleep Study (Addendum)
Patient Name: Stephen Walton, Stephen Walton Date: 01/12/2015 Gender: Male D.O.B: 08-29-1955 Age (years): 72 Referring Provider: Shelva Majestic MD, ABSM Height (inches): 69 Interpreting Physician: Shelva Majestic MD, ABSM Weight (lbs): 240 RPSGT: Gerhard Perches BMI: 35 MRN: [id number] Neck Size: 17.00  CLINICAL INFORMATION Sleep Study Type: NPSG Indication for sleep study: OSA; Patient has been on an Auto PAP machine. Epworth Sleepiness Score: 8  SLEEP STUDY TECHNIQUE As per the AASM Manual for the Scoring of Sleep and Associated Events v2.3 (April 2016) with a hypopnea requiring 4% desaturations. The channels recorded and monitored were frontal, central and occipital EEG, electrooculogram (EOG), submentalis EMG (chin), nasal and oral airflow, thoracic and abdominal wall motion, anterior tibialis EMG, snore microphone, electrocardiogram, and pulse oximetry.  MEDICATIONS  aspirin 81 MG tablet 81 mg, Daily     buPROPion (WELLBUTRIN XL) 150 MG 24 hr tablet 1 tablet, Daily at bedtime     Note: Received from: External Pharmacy Received Sig: (Written 12/16/2013 1133)   escitalopram (LEXAPRO) 10 MG tablet 1 tablet, Daily     Note: Received from: External Pharmacy Received Sig: (Written 12/16/2013 1133)   fish oil-omega-3 fatty acids 1000 MG capsule 1 g, Daily     NON FORMULARY Daily at bedtime     simvastatin (ZOCOR) 20 MG tablet      TESTOSTERONE IM    Medications self-administered by patient during sleep study : No sleep medicine administered.  SLEEP ARCHITECTURE The study was initiated at 10:38:47 PM and ended at 5:00:30 AM. Sleep onset time was 28.4 minutes and the sleep efficiency was 82.3%. The total sleep time was 314.3 minutes. Wake after sleep onset (WASO) was 39 minutes Stage REM latency was 164.5 minutes. The patient spent 7.32% of the night in stage N1 sleep, 76.53% in stage N2 sleep, 0.00% in stage N3 and 16.15% in REM. Alpha intrusion was absent. Supine sleep was  47.24%.  RESPIRATORY PARAMETERS The overall apnea/hypopnea index (AHI) was 2.1 per hour. There were 2 total apneas, including 0 obstructive, 2 central and 0 mixed apneas. There were 9 hypopneas and 11 RERAs. The AHI during Stage REM sleep was 1.2 per hour. AHI while supine was 1.6 per hour. The mean oxygen saturation was 94.81%. The minimum SpO2 during sleep was 91.00%. Loud snoring was noted during this study.  CARDIAC DATA The 2 lead EKG demonstrated sinus rhythm. The mean heart rate was 62.25 beats per minute. Other EKG findings include: None.  LEG MOVEMENT DATA The total PLMS were 1 with a resulting PLMS index of 0.19. Associated arousal with leg movement index was 0.2 .  IMPRESSIONS - Probable increased upper airway resistance syndrome (UARS) without significant obstructive sleep apnea  (AHI 2.1/h). - No significant central sleep apnea occurred during this study (CAI = 0.4/h). - Mildly reduced sleep efficiency. - Mildly abnormal arousal index. - Abnormal sleep architecture with absence of slow wave sleep and prolonged latency to REM sleep. - No significant oxygen desaturation during the study (Min O2 = 91.00%) - The patient snored with Loud snoring volume. - No cardiac abnormalities were noted during this study. - Clinically significant periodic limb movements did not occur during sleep. No significant associated arousals.  DIAGNOSIS - Increased Upper Airway Resistance Syndrome (UARS)  RECOMMENDATIONS - At present, patient was not found to have significant obstructive sleep apnea for CPAP therapy, although this may be helpful with significant upper airway resistance. - Efforts should be made to an optimize nasal and oropharyngeal patency; consider alternatives for the treatment of  snoring - Avoid alcohol, sedatives and other CNS depressants that may worsen sleep apnea and disrupt normal sleep architecture. - Sleep hygiene should be reviewed to assess factors that may improve  sleep quality. - Weight management and regular exercise should be initiated or continued if appropriate.  Troy Sine, MD, [place], American Board of Sleep Medicine  ELECTRONICALLY SIGNED ON:  02/05/2015, 2:39 PM [place] PH: [phone]   FX: [phone] [place]

## 2015-02-22 NOTE — Progress Notes (Signed)
Patient notified of sleep study results. 

## 2015-04-17 ENCOUNTER — Other Ambulatory Visit (HOSPITAL_COMMUNITY): Payer: Self-pay | Admitting: Nephrology

## 2015-04-17 DIAGNOSIS — R809 Proteinuria, unspecified: Secondary | ICD-10-CM

## 2015-04-24 ENCOUNTER — Ambulatory Visit (HOSPITAL_COMMUNITY)
Admission: RE | Admit: 2015-04-24 | Discharge: 2015-04-24 | Disposition: A | Discharge status: Home / Self Care | Payer: BLUE CROSS/BLUE SHIELD | Source: Ambulatory Visit | Attending: Nephrology | Admitting: Nephrology

## 2015-04-24 DIAGNOSIS — R809 Proteinuria, unspecified: Secondary | ICD-10-CM | POA: Diagnosis present

## 2015-07-04 ENCOUNTER — Ambulatory Visit (INDEPENDENT_AMBULATORY_CARE_PROVIDER_SITE_OTHER): Payer: BLUE CROSS/BLUE SHIELD | Admitting: Urology

## 2015-07-04 DIAGNOSIS — C61 Malignant neoplasm of prostate: Secondary | ICD-10-CM

## 2015-07-04 DIAGNOSIS — E291 Testicular hypofunction: Secondary | ICD-10-CM | POA: Diagnosis not present

## 2015-07-19 ENCOUNTER — Ambulatory Visit (INDEPENDENT_AMBULATORY_CARE_PROVIDER_SITE_OTHER): Payer: BLUE CROSS/BLUE SHIELD | Admitting: Urology

## 2015-07-19 DIAGNOSIS — E291 Testicular hypofunction: Secondary | ICD-10-CM | POA: Diagnosis not present

## 2015-09-27 ENCOUNTER — Ambulatory Visit (INDEPENDENT_AMBULATORY_CARE_PROVIDER_SITE_OTHER): Payer: BLUE CROSS/BLUE SHIELD | Admitting: Urology

## 2015-09-27 DIAGNOSIS — R972 Elevated prostate specific antigen [PSA]: Secondary | ICD-10-CM | POA: Diagnosis not present

## 2015-09-27 DIAGNOSIS — E291 Testicular hypofunction: Secondary | ICD-10-CM

## 2015-10-25 ENCOUNTER — Ambulatory Visit (INDEPENDENT_AMBULATORY_CARE_PROVIDER_SITE_OTHER): Payer: BLUE CROSS/BLUE SHIELD | Admitting: Urology

## 2015-10-25 DIAGNOSIS — R972 Elevated prostate specific antigen [PSA]: Secondary | ICD-10-CM

## 2015-10-25 DIAGNOSIS — E291 Testicular hypofunction: Secondary | ICD-10-CM | POA: Diagnosis not present

## 2016-01-09 ENCOUNTER — Ambulatory Visit (INDEPENDENT_AMBULATORY_CARE_PROVIDER_SITE_OTHER): Payer: BLUE CROSS/BLUE SHIELD | Admitting: Urology

## 2016-01-09 DIAGNOSIS — E291 Testicular hypofunction: Secondary | ICD-10-CM | POA: Diagnosis not present

## 2016-01-09 DIAGNOSIS — C61 Malignant neoplasm of prostate: Secondary | ICD-10-CM

## 2016-02-06 ENCOUNTER — Ambulatory Visit (INDEPENDENT_AMBULATORY_CARE_PROVIDER_SITE_OTHER): Payer: BLUE CROSS/BLUE SHIELD | Admitting: Urology

## 2016-02-06 DIAGNOSIS — C61 Malignant neoplasm of prostate: Secondary | ICD-10-CM | POA: Diagnosis not present

## 2016-02-06 DIAGNOSIS — N401 Enlarged prostate with lower urinary tract symptoms: Secondary | ICD-10-CM | POA: Diagnosis not present

## 2016-02-06 DIAGNOSIS — E291 Testicular hypofunction: Secondary | ICD-10-CM

## 2016-02-12 ENCOUNTER — Encounter: Payer: Self-pay | Admitting: *Deleted

## 2016-02-13 ENCOUNTER — Ambulatory Visit: Payer: BLUE CROSS/BLUE SHIELD | Admitting: Cardiovascular Disease

## 2016-04-22 ENCOUNTER — Other Ambulatory Visit: Payer: Self-pay | Admitting: Cardiovascular Disease

## 2016-04-23 IMAGING — US US TRANSRECTAL
1 series · 14 of 16 positions shown · non-contrast
Comparison: none

[Series 1: us transrectal · 0.11mm/px · 14 of 19 slices shown]
[im 1/19]
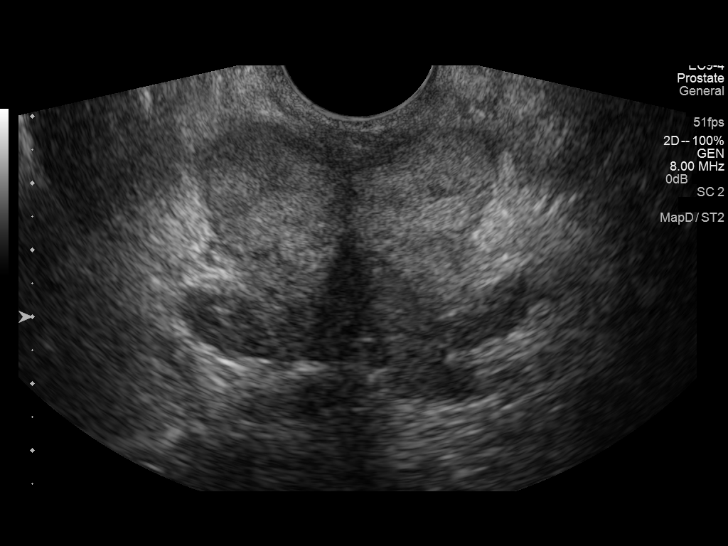
[im 2/19]
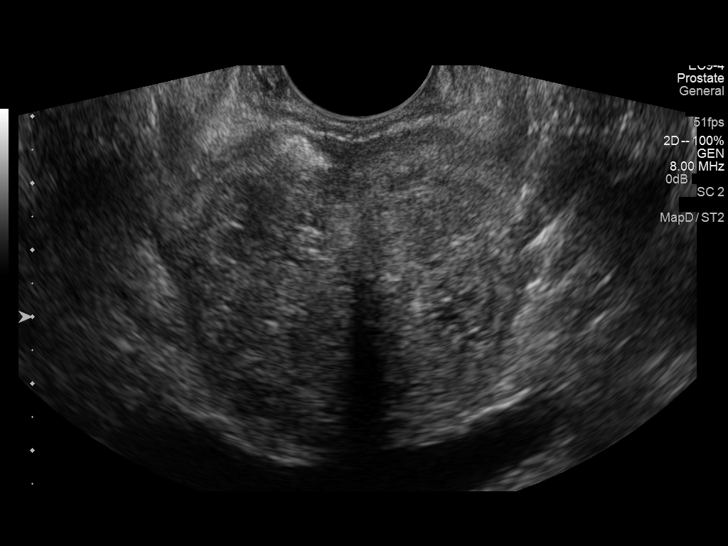
[im 3/19]
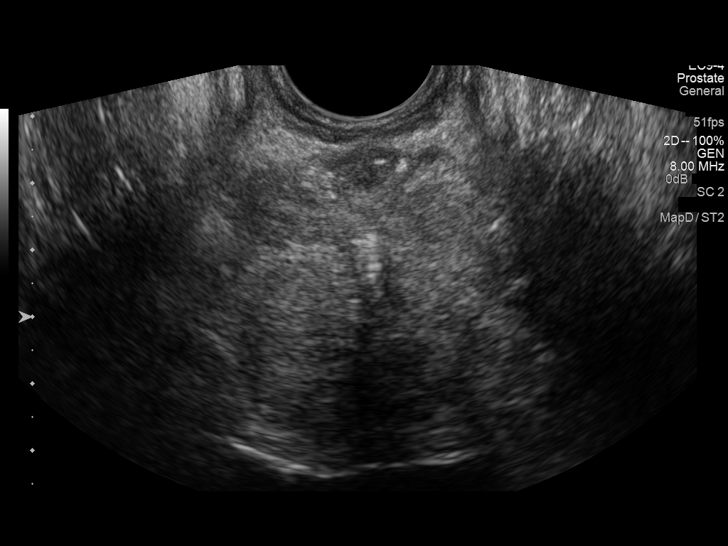
[im 5/19]
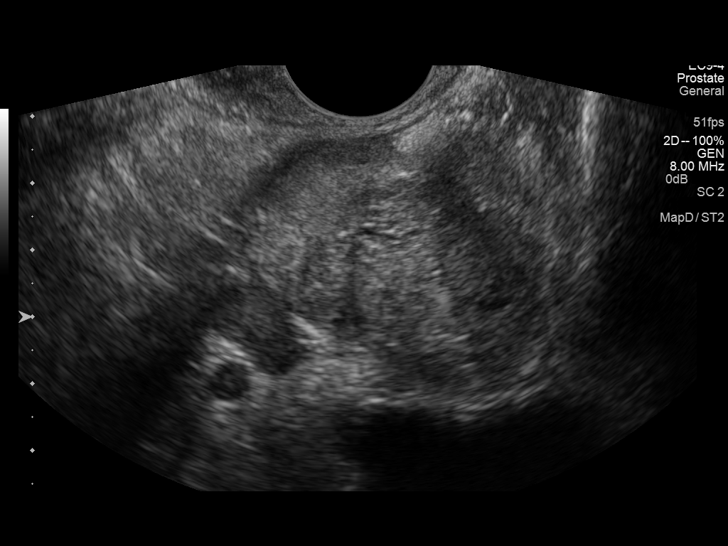
[im 7/19]
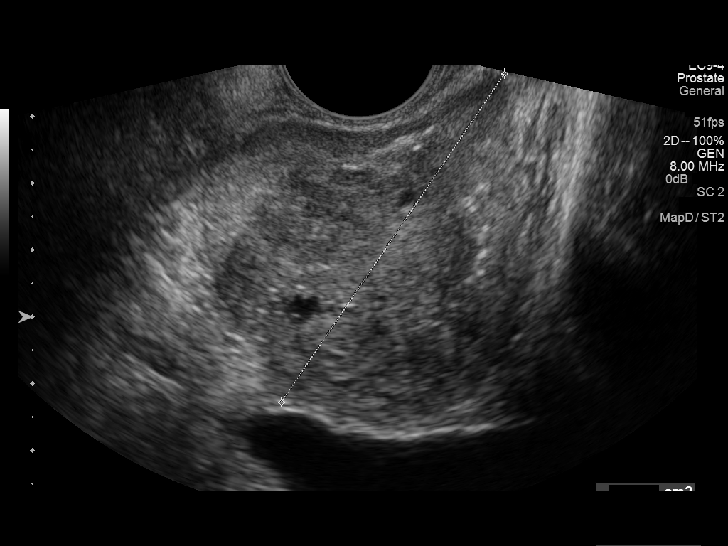
[im 8/19]
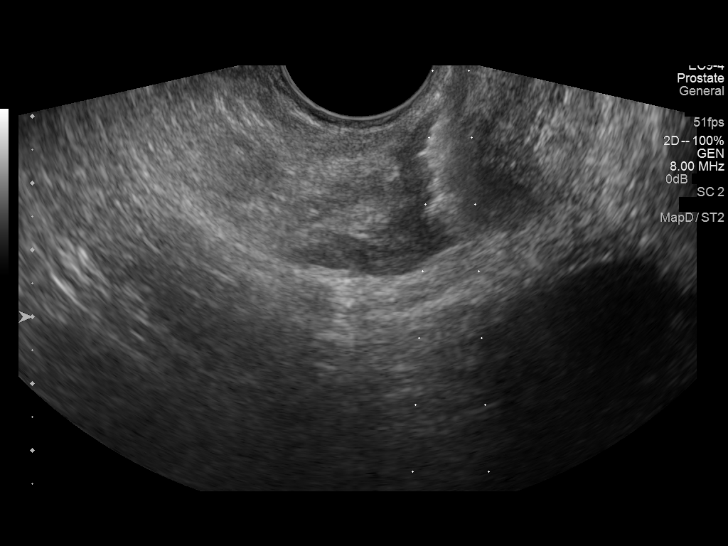
[im 9/19]
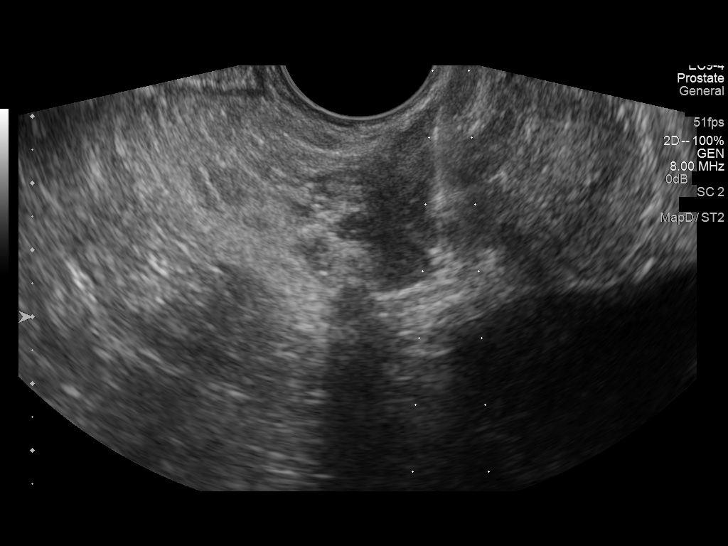
[im 10/19]
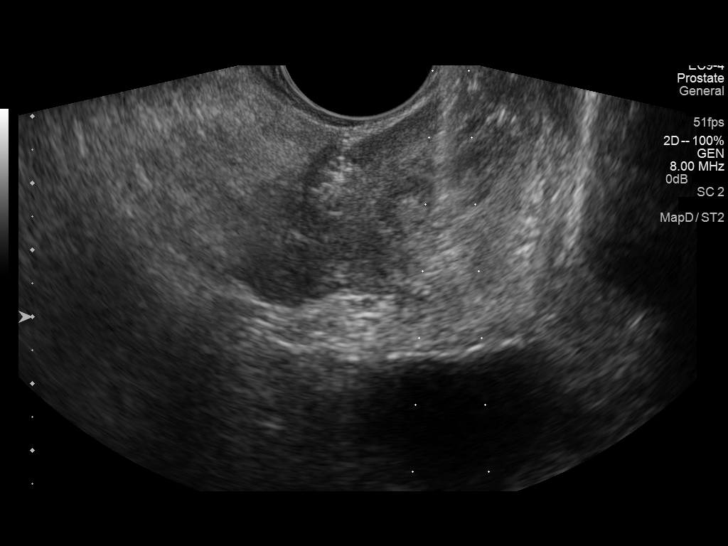
[im 11/19]
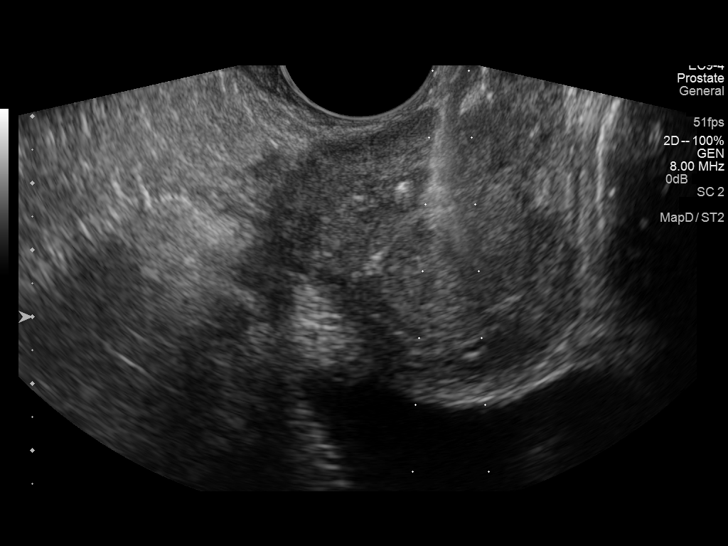
[im 13/19]
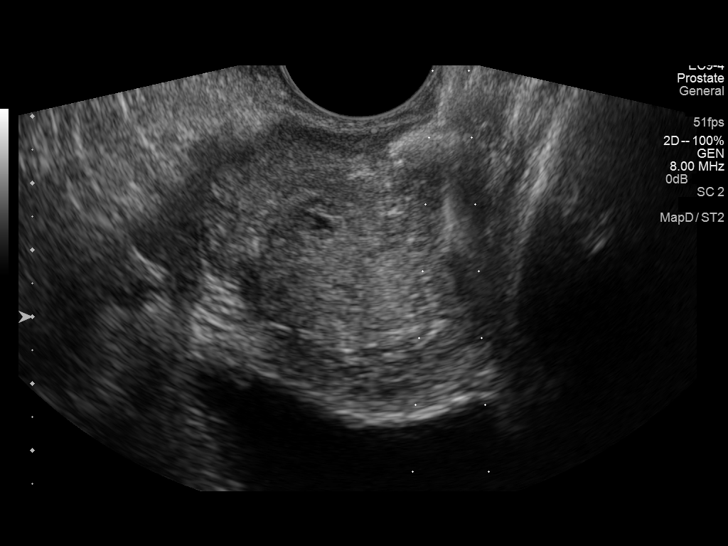
[im 15/19]
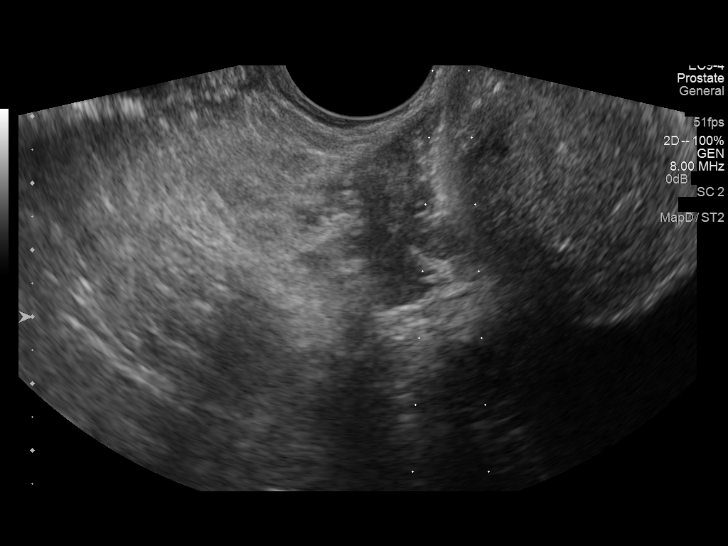
[im 16/19]
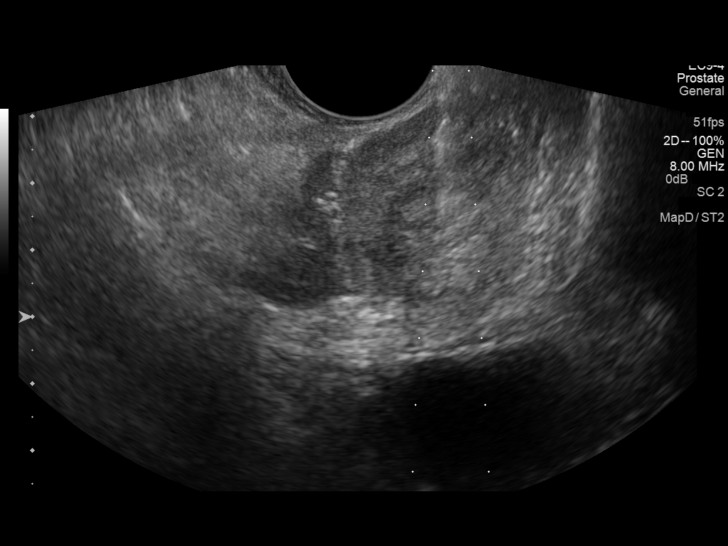
[im 17/19]
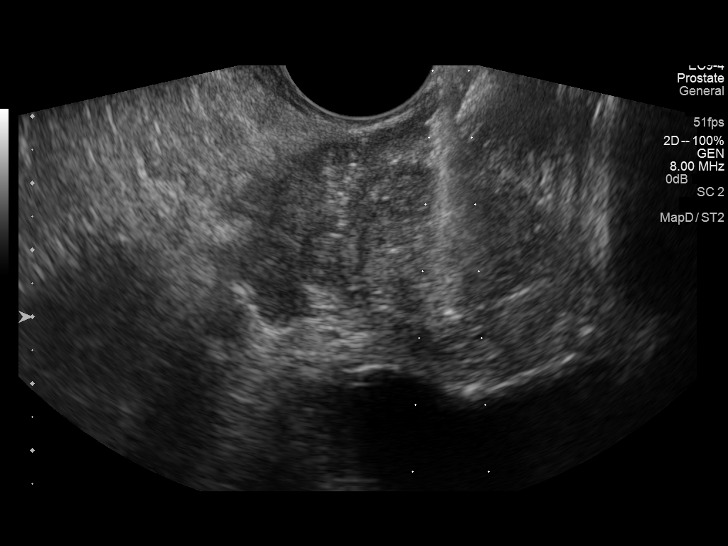
[im 19/19]
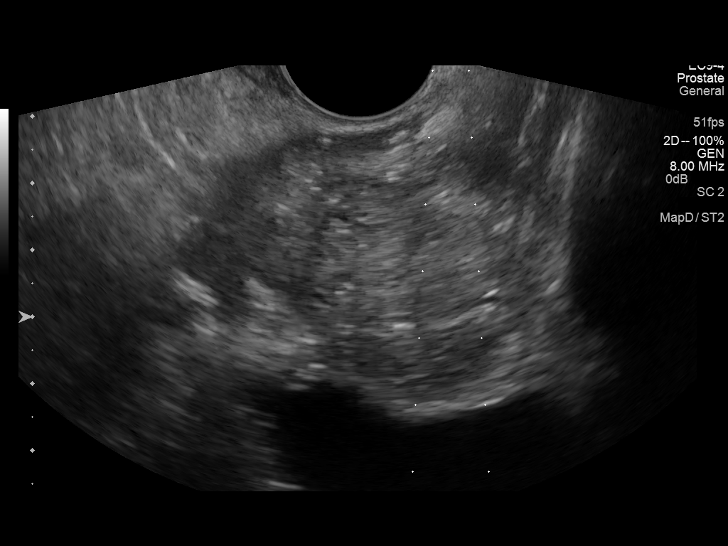

[14 of 16 positions shown; findings below may reference images not displayed]

Canned report from images found in remote index.

Refer to host system for actual result text.

## 2016-04-23 NOTE — Telephone Encounter (Signed)
Rx(s) sent to pharmacy electronically. OV 05/02/16 with MD

## 2016-05-02 ENCOUNTER — Encounter: Payer: Self-pay | Admitting: Cardiovascular Disease

## 2016-05-02 ENCOUNTER — Ambulatory Visit (INDEPENDENT_AMBULATORY_CARE_PROVIDER_SITE_OTHER): Payer: BLUE CROSS/BLUE SHIELD | Admitting: Cardiovascular Disease

## 2016-05-02 VITALS — BP 134/88 | HR 85 | Ht 69.0 in | Wt 259.0 lb

## 2016-05-02 DIAGNOSIS — E669 Obesity, unspecified: Secondary | ICD-10-CM | POA: Diagnosis not present

## 2016-05-02 DIAGNOSIS — G4733 Obstructive sleep apnea (adult) (pediatric): Secondary | ICD-10-CM

## 2016-05-02 DIAGNOSIS — F419 Anxiety disorder, unspecified: Secondary | ICD-10-CM

## 2016-05-02 DIAGNOSIS — E785 Hyperlipidemia, unspecified: Secondary | ICD-10-CM

## 2016-05-02 DIAGNOSIS — Z6838 Body mass index (BMI) 38.0-38.9, adult: Secondary | ICD-10-CM

## 2016-05-02 DIAGNOSIS — Z8546 Personal history of malignant neoplasm of prostate: Secondary | ICD-10-CM

## 2016-05-02 DIAGNOSIS — R0789 Other chest pain: Secondary | ICD-10-CM

## 2016-05-02 DIAGNOSIS — Z9989 Dependence on other enabling machines and devices: Secondary | ICD-10-CM

## 2016-05-02 NOTE — Patient Instructions (Signed)
Your physician wants you to follow-up in: 1 year or sooner if needed. You will receive a reminder letter in the mail two months in advance. If you don't receive a letter, please call our office to schedule the follow-up appointment.   If you need a refill on your cardiac medications before your next appointment, please call your pharmacy.   

## 2016-05-02 NOTE — Progress Notes (Signed)
Patient ID: TRAY EMANUEL, male   DOB: [birth year], 61 y.o.   MRN: [id number]       HPI: KEIJUAN IWANICKI is a 61 y.o. male who presents for a follow-up 68 month  cardiology evaluation.   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 He has a history of obesity, sleep apnea on CPAP therapy, and in 2013 developed episodes of chest pain with some atypical features. This occurred shortly after his brother suffered a cardiac arrest. An echo Doppler study revealed mild concentric LVH with normal systolic and diastolic function. He did have mild aortic valve sclerosis without stenosis. A nuclear perfusion study done on 10/04/2011 showed normal perfusion.  He has the metabolic syndrome. In March 2014 an NMR lipoprofile showed an increase LDL particle number at 1647 with an LDL of 132. He had 736 small LDL particles. His insulin resistance was 53. HDL was 53. Normal HDL particle #37.2. He has been on simvastatin 20 mg since that time. Subsequent blood work showed marked improvement with a total cholesterol 146 triglycerides 114 HDL 49 LDL cholesterol 74. Mr. Motts walks at least 5 miles daily. However, despite this has been difficult for him  to lose weight. He denies recurrent chest pain.   He admits to 100% CPAP use.  He is the father of [name], who previously had worked for [employer] in [employer].  Over the past 2 years, he has gained approximately 10 additional pounds of weight despite his exercise. He has had recent laboratory done by Dr. Orson Ape in [place]. He denies recurrent episodes of chest pain. He denies palpitations. He denies presyncope or syncope.  When I saw him several years ago his brother recently died with a myocardial infarction.  Mr. Sule had gained some weight but had lost 20 poundsafter he  was given phentermine, which assisted him in his weight loss by a former physician  at [facility] and he had seen a nutritionist.  After the physician left the new physician who had taken her place would not reinstitute this therapy.  He continues to walk approximately 5 miles per day at work at [employer].  He had gained significant weight.  When he was placed on Lexapro for depression but due to this.  He stopped taking the Lexapro and lost 20 pounds as noted above.  I last saw him, since his CPAP machine was very old, and was not downloadable, I had recommended he undergo a follow-up sleep study.  This was done in October 2016.  During that study , he was felt to have increased upper airway resistance, but did not meet criteria for obstructive sleep apnea.  Last year, he apparently underwent a home study, which he brought with him and I reviewed today.  This showed an AHI of 14.3 with an RDI of 20.5 in June 2017.  Since then he has been on a CPAP auto unit which was supplied by [employer].  He had blood work at [facility] by Dr. Annie Main a Robby Sermon which I reviewed, which was obtained last April 2017.  Hemoglobin 16.9, hematocrit 50.5.  BUN 15, creatinine 1.02.  Glucose 90.  Total cholesterol 146, triglycerides 75, HDL 50, LDL 76.  TSH was 1.18.  Since I last saw him, he has continued to gain weight.  He is now 20 pounds above what he had weighed 2016.  He no longer is  working on [place] but is now working the night shift since his dog retired.  His shift is from 6 PM to 6 AM.  He typically walks 5 miles at [facility].  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  He admits to some occasional left ankle swelling.  He is frustrated with his weight gain  He denies chest pain or palpitations.  He presents for reevaluation  Past Medical History:  Diagnosis Date  . Chest pain, atypical   . H/O echocardiogram 10/04/2011   EF >55%;mild concentric LVH; normals systolic & diastolic dysfunction; mild aortic stenosis  . History of nuclear stress test 10/04/2011     low risk; small amount of basal inferior bowel artifact  . Hyperlipidemia   . OSA on CPAP     History reviewed. No pertinent surgical history.  No Known Allergies  Current Outpatient Prescriptions  Medication Sig Dispense Refill  . aspirin 81 MG tablet Take 81 mg by mouth daily.    Marland Kitchen buPROPion (WELLBUTRIN XL) 150 MG 24 hr tablet Take 1 tablet by mouth at bedtime.    Marland Kitchen buPROPion (ZYBAN) 150 MG 12 hr tablet Take 150 mg by mouth daily.    . fish oil-omega-3 fatty acids 1000 MG capsule Take 1 g by mouth daily.    . NON FORMULARY at bedtime. CPAP    . simvastatin (ZOCOR) 20 MG tablet TAKE (1) TABLET BY MOUTH AT BEDTIME. 90 tablet 0   No current facility-administered medications for this visit.     Socially he is married has 4 children. 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 There is no tobacco use having quit over 30 years ago. There is nosignificant alcohol use  ROS General: Negative; No fevers, chills, or night sweats;  HEENT: Negative; No changes in vision or hearing, sinus congestion, difficulty swallowing Pulmonary: Negative; No cough, wheezing, shortness of breath, hemoptysis Cardiovascular: Negative; No chest pain, presyncope, syncope, palpitations GI: Negative; No nausea, vomiting, diarrhea, or abdominal pain GU: Enlarged prostate, status post biopsy; No dysuria, hematuria, or difficulty voiding Musculoskeletal: Negative; no myalgias, joint pain, or weakness Hematologic/Oncology: Negative; no easy bruising, bleeding Endocrine: Positive for metabolic syndrome; no heat/cold intolerance; no diabetes Neuro: Negative; no changes in balance, headaches Skin: Negative; No rashes or skin lesions Psychiatric: Depression following his brother's death Sleep: Positive for obstructive sleep apnea on CPAP with 100% compliance; recent breakthrough snoring, no daytime sleepiness, hypersomnolence, bruxism, restless legs, hypnogognic hallucinations, no  cataplexy Other comprehensive 14 point system review is negative.  PE BP 134/88   Pulse 85   Ht 5\' 9"  (1.753 m)   Wt 259 lb (117.5 kg)   BMI 38.25 kg/m   General: Alert, oriented, no distress. 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 Pupils round and reactive; sclera anicteric;no lid lag. No xanthelasma Nose without nasal septal hypertrophy Mouth/Parynx benign; Mallinpatti scale 3 Neck: No JVD, no carotid bruits with normal carotid upstroke Lungs: clear to ausculatation and percussion; no wheezing or rales Heart: RRR, s1 s2 [id number] systolic murmur; no diastolic murmur, rubs thrills or heaves Abdomen: soft, nontender; no hepatosplenomehaly, BS+; abdominal aorta nontender and not dilated by palpation. Pulses 2+ Extremities: Trivial left ankle swelling ;no clubbing cyanosis, Homan's sign negative  Neurologic: grossly nonfocal; cranial nerves normal Psychological: Normal affect  and mood  ECG (independently read by me): Sinus rhythm at 85 bpm.  Rightward axis.  No signal ST changes.  September 2016 ECG (independently read by me): NSR at 76; normal intervals  ECG (independently read by me): Normal sinus rhythm at 77 beats per minute. No ectopy. Normal intervals.  Prior ECG of 09/28/2012: Sinus rhythm at 61 beats per minute; normal intervals  LABS:  I reviewed recent blood work by Dr. Julianne Handler, M.D., from 07/14/2014.   Hemoglobin 17.3, hematocrit 51.6.  Fasting glucose 92.  BUN 17, currently 0.9.  Normal LFTs.  Total cluster 153, triglycerides 90, HDL 52, LDL 83.  PSA 2.1.  TSH 1.12.  I also reviewed the blood work from April 2017, which is noted above.  BMET No results found for: NA   Hepatic Function Panel  No results found for: PROT   CBC No results found for: WBC   BNP No  results found for: PROBNP  Lipid Panel  No results found for: CHOL   RADIOLOGY: No results found.  IMPRESSION:  1. Atypical chest pain   2. Hyperlipidemia, unspecified hyperlipidemia type   3. OSA on CPAP   4. Class 2 obesity without serious comorbidity with body mass index (BMI) of 38.0 to 38.9 in adult, unspecified obesity type   5. Anxiety   6. 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  He is had difficulty with weight gain over the past several years.  He has seen a nutritionist and has been trying to carefully watch what he eats.  He is now working third shift from 6 PM until 6 AM.  Denies any chest pain with walking.  Now back on CPAP therapy for obstructive sleep apnea and with his weight gain home sleep study reverified moderate sleep apnea.  He admits to 100% compliance.  He continues to be on simvastatin 20 mg and I reviewed his most recent laboratory.  He is on Wellbutrin for anxiety and continues to take aspirin 81 mg antiplatelet benefit.  He denies any exertional chest pain.  He denies PND or orthopnea.  A long discussion regarding diet.  I reviewed his home sleep study as well as the sleep study from 2016.  There is trivial left ankle swelling which is not significant.  His blood pressure is stable and on repeat by me was 128/82.  I discussed with him the importance of avoiding adding sodium to his food.  He tells me he will be having repeat blood work next month and I last these be sent to me for my review.  He has a history of an elevated PSA level for which she is followed by Dr. Mar Daring.  He is contemplating retiring in a proximally one-year.  I will see him in one year for reevaluation.  Time spent: 25 minutes Troy Sine, MD, [facility]  05/02/2016 7:41 PM

## 2016-05-21 ENCOUNTER — Ambulatory Visit (INDEPENDENT_AMBULATORY_CARE_PROVIDER_SITE_OTHER): Payer: BLUE CROSS/BLUE SHIELD | Admitting: Urology

## 2016-05-21 DIAGNOSIS — C61 Malignant neoplasm of prostate: Secondary | ICD-10-CM

## 2016-05-21 DIAGNOSIS — E291 Testicular hypofunction: Secondary | ICD-10-CM | POA: Diagnosis not present

## 2016-08-23 ENCOUNTER — Other Ambulatory Visit: Payer: Self-pay | Admitting: Cardiovascular Disease

## 2016-09-24 ENCOUNTER — Ambulatory Visit (INDEPENDENT_AMBULATORY_CARE_PROVIDER_SITE_OTHER): Payer: BLUE CROSS/BLUE SHIELD | Admitting: Urology

## 2016-09-24 DIAGNOSIS — E291 Testicular hypofunction: Secondary | ICD-10-CM | POA: Diagnosis not present

## 2016-12-31 ENCOUNTER — Ambulatory Visit (INDEPENDENT_AMBULATORY_CARE_PROVIDER_SITE_OTHER): Payer: BLUE CROSS/BLUE SHIELD | Admitting: Urology

## 2016-12-31 DIAGNOSIS — C61 Malignant neoplasm of prostate: Secondary | ICD-10-CM

## 2016-12-31 DIAGNOSIS — E291 Testicular hypofunction: Secondary | ICD-10-CM | POA: Diagnosis not present

## 2017-01-21 ENCOUNTER — Ambulatory Visit (INDEPENDENT_AMBULATORY_CARE_PROVIDER_SITE_OTHER): Payer: BLUE CROSS/BLUE SHIELD | Admitting: Urology

## 2017-01-21 DIAGNOSIS — E291 Testicular hypofunction: Secondary | ICD-10-CM

## 2017-01-21 DIAGNOSIS — C61 Malignant neoplasm of prostate: Secondary | ICD-10-CM | POA: Diagnosis not present

## 2017-04-29 ENCOUNTER — Ambulatory Visit (INDEPENDENT_AMBULATORY_CARE_PROVIDER_SITE_OTHER): Payer: BLUE CROSS/BLUE SHIELD | Admitting: Urology

## 2017-04-29 DIAGNOSIS — E291 Testicular hypofunction: Secondary | ICD-10-CM

## 2017-04-29 DIAGNOSIS — C61 Malignant neoplasm of prostate: Secondary | ICD-10-CM

## 2017-06-04 LAB — PULMONARY FUNCTION TEST

## 2017-06-06 ENCOUNTER — Ambulatory Visit: Payer: BLUE CROSS/BLUE SHIELD | Admitting: Cardiovascular Disease

## 2017-07-08 ENCOUNTER — Other Ambulatory Visit: Payer: Self-pay | Admitting: Cardiovascular Disease

## 2017-07-08 NOTE — Telephone Encounter (Signed)
Rx(s) sent to pharmacy electronically.  

## 2017-07-22 ENCOUNTER — Encounter: Payer: Self-pay | Admitting: Cardiovascular Disease

## 2017-07-23 ENCOUNTER — Ambulatory Visit (INDEPENDENT_AMBULATORY_CARE_PROVIDER_SITE_OTHER): Payer: BLUE CROSS/BLUE SHIELD | Admitting: Cardiovascular Disease

## 2017-07-23 ENCOUNTER — Encounter: Payer: Self-pay | Admitting: Cardiovascular Disease

## 2017-07-23 VITALS — BP 132/88 | HR 69 | Ht 69.0 in | Wt 246.8 lb

## 2017-07-23 DIAGNOSIS — Z9989 Dependence on other enabling machines and devices: Secondary | ICD-10-CM | POA: Diagnosis not present

## 2017-07-23 DIAGNOSIS — E785 Hyperlipidemia, unspecified: Secondary | ICD-10-CM | POA: Diagnosis not present

## 2017-07-23 DIAGNOSIS — R0789 Other chest pain: Secondary | ICD-10-CM | POA: Diagnosis not present

## 2017-07-23 DIAGNOSIS — Z8546 Personal history of malignant neoplasm of prostate: Secondary | ICD-10-CM

## 2017-07-23 DIAGNOSIS — G4733 Obstructive sleep apnea (adult) (pediatric): Secondary | ICD-10-CM

## 2017-07-23 DIAGNOSIS — E669 Obesity, unspecified: Secondary | ICD-10-CM | POA: Diagnosis not present

## 2017-07-23 DIAGNOSIS — Z6838 Body mass index (BMI) 38.0-38.9, adult: Secondary | ICD-10-CM | POA: Diagnosis not present

## 2017-07-23 NOTE — Progress Notes (Signed)
Patient ID: Stephen Walton, male   DOB: Mar 05, 1956, 62 y.o.   MRN: [id number]       HPI: [name] is a 62 y.o. male who presents for a follow-up 15 month  cardiology evaluation.   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 He has a history of obesity, sleep apnea on CPAP therapy, and in 2013 developed episodes of chest pain with some atypical features. This occurred shortly after his brother suffered a cardiac arrest. An echo Doppler study revealed mild concentric LVH with normal systolic and diastolic function. He did have mild aortic valve sclerosis without stenosis. A nuclear perfusion study done on 10/04/2011 showed normal perfusion.  He has the metabolic syndrome. In March 2014 an NMR lipoprofile showed an increase LDL particle number at 1647 with an LDL of 132. He had 736 small LDL particles. His insulin resistance was 53. HDL was 53. Normal HDL particle #37.2. He has been on simvastatin 20 mg since that time. Subsequent blood work showed marked improvement with a total cholesterol 146 triglycerides 114 HDL 49 LDL cholesterol 74. [name] walks at least 5 miles daily. However, despite this has been difficult for him  to lose weight. He denies recurrent chest pain.   He admits to 100% CPAP use.  He is the father of [name], who previously had worked for [employer] in [employer].  Over the past 2 years, he has gained approximately 10 additional pounds of weight despite his exercise. He has had recent laboratory done by Dr. Orson Ape in [place]. He denies recurrent episodes of chest pain. He denies palpitations. He denies presyncope or syncope.  When I saw him several years ago his brother recently died with a myocardial infarction.  [name] had gained some weight but had lost 20 poundsafter he  was given phentermine, which assisted him in his weight loss by a former physician  at [facility] and he had seen a nutritionist.  After the physician left the new physician who had taken her place would not reinstitute this therapy.  He continues to walk approximately 5 miles per day at work at [employer].  He had gained significant weight.  When he was placed on Lexapro for depression but due to this.  He stopped taking the Lexapro and lost 20 pounds as noted above.  I last saw him, since his CPAP machine was very old, and was not downloadable, I had recommended he undergo a follow-up sleep study.  This was done in October 2016.  During that study , he was felt to have increased upper airway resistance, but did not meet criteria for obstructive sleep apnea.  Last year, he apparently underwent a home study, which he brought with him and I reviewed today.  This showed an AHI of 14.3 with an RDI of 20.5 in June 2017.  Since then he has been on a CPAP auto unit which was supplied by [employer].  He had blood work at [facility] by Dr. Annie Main a Robby Sermon which I reviewed, which was obtained last April 2017.  Hemoglobin 16.9, hematocrit 50.5.  BUN 15, creatinine 1.02.  Glucose 90.  Total cholesterol 146, triglycerides 75, HDL 50, LDL 76.  TSH was 1.18.  I last saw him in February 2015 he complained of weight gain.  He is now 20 pounds above what he had weighed 2016.  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  His shift is from 6 PM to 6 AM.  He typically walks 5 miles at [facility].  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  He admits to some occasional left ankle swelling.  He was started on 6 Zander for appetite control by Delman Cheadle at [facility].  This has resulted in weight loss of approximately 16 pounds.  Since I last saw him, he remains active.  He is not sleeping adequate duration due to his night shift.  He continues to use CPAP.  He brought a download from his app to the office today.   He is averaging 5 hours and 57 minutes of sleep per night.  AHI is excellent at 1.4.  At times he does have a mask leak.  He recently underwent laboratory at [facility] on June 04, 2017.  Hemoglobin was 14.2 hematocrit 44.8.  MCV low at 75.  Renal function was normal with a creatinine 1.09.  Total cholesterol 131, triglycerides 76, HDL 47, LDL 69.  Hemoglobin A1c was 5.8.  He is followed every 6 months by Dr. Diona Fanti.  PSA was 6.7.  He also is on testosterone injections.  He tells me he has been undergoing blood donation every 2 months.  He presents for evaluation.  Past Medical History:  Diagnosis Date  . Chest pain, atypical   . H/O echocardiogram 10/04/2011   EF >55%;mild concentric LVH; normals systolic & diastolic dysfunction; mild aortic stenosis  . History of nuclear stress test 10/04/2011   low risk; small amount of basal inferior bowel artifact  . Hyperlipidemia   . OSA on CPAP     No past surgical history on file.  No Known Allergies  Current Outpatient Medications  Medication Sig Dispense Refill  . aspirin 81 MG tablet Take 81 mg by mouth daily.    Marland Kitchen buPROPion (WELLBUTRIN XL) 150 MG 24 hr tablet Take 1 tablet by mouth at bedtime.    Marland Kitchen buPROPion (ZYBAN) 150 MG 12 hr tablet Take 150 mg by mouth daily.    . fish oil-omega-3 fatty acids 1000 MG capsule Take 1 g by mouth daily.    . NON FORMULARY at bedtime. CPAP    . simvastatin (ZOCOR) 20 MG tablet TAKE (1) TABLET BY MOUTH AT BEDTIME. 90 tablet 0   No current facility-administered medications for this visit.     Socially he is married has 4 children. 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 There is no tobacco use having quit over 30 years ago. There is nosignificant alcohol use  ROS General: Negative; No fevers, chills, or night sweats;  HEENT: Negative; No changes in vision or hearing, sinus congestion, difficulty swallowing Pulmonary: Negative; No cough, wheezing, shortness of  breath, hemoptysis Cardiovascular: Negative; No chest pain, presyncope, syncope, palpitations GI: Negative; No nausea, vomiting, diarrhea, or abdominal pain GU: Enlarged prostate, status post biopsy; No dysuria, hematuria, or difficulty voiding Musculoskeletal: Negative; no myalgias, joint pain, or weakness Hematologic/Oncology: Negative; no easy bruising, bleeding Endocrine: Positive for metabolic syndrome; no heat/cold intolerance; no diabetes Neuro: Negative; no changes in balance, headaches Skin: Negative; No rashes or skin lesions Psychiatric: Depression following his brother's death Sleep: Positive for obstructive sleep apnea on CPAP with 100% compliance; recent breakthrough snoring, no daytime sleepiness, hypersomnolence, bruxism, restless legs, hypnogognic hallucinations, no cataplexy Other comprehensive 14 point system  review is negative    Physical Exam BP 132/88   Pulse 69   Ht 5\' 9"  (1.753 m)   Wt 246 lb 12.8 oz (111.9 kg)   BMI 36.45 kg/m    Repeat blood pressure was 136/84  Wt Readings from Last 3 Encounters:  07/23/17 246 lb 12.8 oz (111.9 kg)  05/02/16 259 lb (117.5 kg)  01/13/15 240 lb (108.9 kg)   General: Alert, oriented, no distress.  Skin: normal turgor, no rashes, warm and dry HEENT: Normocephalic, atraumatic. Pupils equal round and reactive to light; sclera anicteric; extraocular muscles intact;  Nose without nasal septal hypertrophy Mouth/Parynx benign; Mallinpatti scale 3 Neck: No JVD, no carotid bruits; normal carotid upstroke Lungs: clear to ausculatation and percussion; no wheezing or rales Chest wall: without tenderness to palpitation Heart: PMI not displaced, RRR, s1 s2 normal, 1/6 systolic murmur, no diastolic murmur, no rubs, gallops, thrills, or heaves Abdomen: soft, nontender; no hepatosplenomehaly, BS+; abdominal aorta nontender and not dilated by palpation. Back: no CVA tenderness Pulses 2+ Musculoskeletal: full range of motion, normal  strength, no joint deformities Extremities: no clubbing cyanosis or edema, Homan's sign negative  Neurologic: grossly nonfocal; Cranial nerves grossly wnl Psychologic: Normal mood and affect   ECG (independently read by me): Normal sinus rhythm at 69 bpm.  No ectopy.  Normal intervals.  February 2018 ECG (independently read by me): Sinus rhythm at 85 bpm.  Rightward axis.  No signal ST changes.  September 2016 ECG (independently read by me): NSR at 76; normal intervals  ECG (independently read by me): Normal sinus rhythm at 77 beats per minute. No ectopy. Normal intervals.  Prior ECG of 09/28/2012: Sinus rhythm at 61 beats per minute; normal intervals  LABS:  I reviewed recent blood work by Dr. Julianne Handler, M.D., from 07/14/2014.   Hemoglobin 17.3, hematocrit 51.6.  Fasting glucose 92.  BUN 17, currently 0.9.  Normal LFTs.  Total cluster 153, triglycerides 90, HDL 52, LDL 83.  PSA 2.1.  TSH 1.12.  I also reviewed the blood work from April 2017, which is noted above.  Laboratory from [facility] from June 04, 2017 was reviewed as dictated above in HPI  BMET No results found for: NA   Hepatic Function Panel  No results found for: PROT   CBC No results found for: WBC   BNP No results found for: PROBNP  Lipid Panel  No results found for: CHOL   RADIOLOGY: No results found.  IMPRESSION:  1. Atypical chest pain   2. Hyperlipidemia, unspecified hyperlipidemia type   3. OSA on CPAP   4. Class 2 obesity without serious comorbidity with body mass index (BMI) of 38.0 to 38.9 in adult, unspecified obesity type   5. 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  He is had difficulty with weight gain over the past several years.  He has seen a nutritionist and has been trying to carefully watch what he eats.  He is now  working third shift from 6 PM until 6 AM.  He denies any change in exercise tolerance.  However he is not walking as much on the night shift as he had when he was on the dayshift with the canine unit.  Blood pressure today is stable.he is now on Krill oil instead of fish oil and utilizes simvastatin 20 mg  daily.  Most recent lipid studies are excellent.  He has been on an appetite suppressant prescribed at [facility] which has resulted in a 16 pound weight loss.  The past he has had atypical chest pain.  These have been sharp and non-exertionally precipitated.  He denies any exertional anginal type symptoms.  He has a history of BPH with previous prostate biopsies x2.  His recent PSA was mildly elevated at 6.7 and he will be following up with Dr. Diona Fanti.  He continues to use CPAP with 100% compliance.  We discussed improved sleep duration since he is sleeping less than 6 hours per night.  Sleep duration is dictated by his evening work from 6 PM to 6 AM.  He typically is in bed by 7 AM but oftentimes is up by 12:30 rest.  He admits to occasional daytime fatigability.  We discussed the importance of continued weight loss exercise.  He is utilizing CPAP therapy with 100% compliance and his sleep is improved.  He will be following up with Dr. Diona Fanti.  With his MCV of 75 I have suggested he reduce the frequency of his blood donation.  He had initiated this in the past when his hemoglobin was 18 which  May have been contributed by his testosterone injections.  I will see him in 1 year for reevaluation.  Time spent: 25 minutes Troy Sine, MD, [facility]  07/23/2017 4:08 PM

## 2017-07-23 NOTE — Patient Instructions (Signed)
Medication Instructions:  Your physician recommends that you continue on your current medications as directed. Please refer to the Current Medication list given to you today.  Follow-Up: Your physician wants you to follow-up in: 12 months with Dr. Kelly.  You will receive a reminder letter in the mail two months in advance. If you don't receive a letter, please call our office to schedule the follow-up appointment.   If you need a refill on your cardiac medications before your next appointment, please call your pharmacy.   

## 2017-08-27 ENCOUNTER — Ambulatory Visit (INDEPENDENT_AMBULATORY_CARE_PROVIDER_SITE_OTHER): Payer: BLUE CROSS/BLUE SHIELD | Admitting: Urology

## 2017-08-27 DIAGNOSIS — E291 Testicular hypofunction: Secondary | ICD-10-CM | POA: Diagnosis not present

## 2017-08-27 DIAGNOSIS — C61 Malignant neoplasm of prostate: Secondary | ICD-10-CM | POA: Diagnosis not present

## 2017-11-03 ENCOUNTER — Other Ambulatory Visit: Payer: Self-pay | Admitting: Cardiovascular Disease

## 2017-12-10 ENCOUNTER — Ambulatory Visit (INDEPENDENT_AMBULATORY_CARE_PROVIDER_SITE_OTHER): Payer: BLUE CROSS/BLUE SHIELD | Admitting: Urology

## 2017-12-10 DIAGNOSIS — E291 Testicular hypofunction: Secondary | ICD-10-CM

## 2018-02-03 ENCOUNTER — Other Ambulatory Visit: Payer: Self-pay | Admitting: Urology

## 2018-02-03 DIAGNOSIS — C61 Malignant neoplasm of prostate: Secondary | ICD-10-CM

## 2018-02-24 ENCOUNTER — Ambulatory Visit
Admission: RE | Admit: 2018-02-24 | Discharge: 2018-02-24 | Disposition: A | Discharge status: Home / Self Care | Payer: BLUE CROSS/BLUE SHIELD | Source: Ambulatory Visit | Attending: Urology | Admitting: Urology

## 2018-02-24 DIAGNOSIS — C61 Malignant neoplasm of prostate: Secondary | ICD-10-CM

## 2018-02-24 MED ORDER — GADOBENATE DIMEGLUMINE 529 MG/ML IV SOLN
20.0000 mL | Freq: Once | INTRAVENOUS | Status: AC | PRN
  Administered 2018-02-24: 20 mL via INTRAVENOUS

## 2018-03-04 ENCOUNTER — Ambulatory Visit (INDEPENDENT_AMBULATORY_CARE_PROVIDER_SITE_OTHER): Payer: BLUE CROSS/BLUE SHIELD | Admitting: Urology

## 2018-03-04 DIAGNOSIS — C61 Malignant neoplasm of prostate: Secondary | ICD-10-CM

## 2018-03-04 DIAGNOSIS — E291 Testicular hypofunction: Secondary | ICD-10-CM

## 2018-07-28 ENCOUNTER — Telehealth: Payer: Self-pay | Admitting: Cardiovascular Disease

## 2018-07-28 NOTE — Telephone Encounter (Signed)
LVM for patient to call back with choice of phone or video visit.

## 2018-07-30 ENCOUNTER — Telehealth: Payer: Self-pay | Admitting: Cardiovascular Disease

## 2018-07-30 NOTE — Telephone Encounter (Signed)
LVM to inform pt that appt with Dr. Claiborne Billings has been rescheduled to Wednesday, 09/16/18 at 1:40 PM. Was originally on 08/04/18. Asked pt to call back to change visit to virtual and let me know if this date and time works for him.

## 2018-08-04 ENCOUNTER — Telehealth: Payer: Commercial Managed Care - PPO | Admitting: Cardiovascular Disease

## 2018-09-16 ENCOUNTER — Encounter: Payer: Self-pay | Admitting: Cardiovascular Disease

## 2018-09-16 ENCOUNTER — Telehealth (INDEPENDENT_AMBULATORY_CARE_PROVIDER_SITE_OTHER): Payer: BC Managed Care – PPO | Admitting: Cardiovascular Disease

## 2018-09-16 VITALS — BP 135/72 | HR 78 | Temp 97.9°F | Ht 69.0 in | Wt 237.0 lb

## 2018-09-16 DIAGNOSIS — Z6838 Body mass index (BMI) 38.0-38.9, adult: Secondary | ICD-10-CM

## 2018-09-16 DIAGNOSIS — F419 Anxiety disorder, unspecified: Secondary | ICD-10-CM

## 2018-09-16 DIAGNOSIS — G4733 Obstructive sleep apnea (adult) (pediatric): Secondary | ICD-10-CM

## 2018-09-16 DIAGNOSIS — E8881 Metabolic syndrome: Secondary | ICD-10-CM | POA: Diagnosis not present

## 2018-09-16 DIAGNOSIS — E669 Obesity, unspecified: Secondary | ICD-10-CM

## 2018-09-16 DIAGNOSIS — E785 Hyperlipidemia, unspecified: Secondary | ICD-10-CM | POA: Diagnosis not present

## 2018-09-16 DIAGNOSIS — R0789 Other chest pain: Secondary | ICD-10-CM | POA: Diagnosis not present

## 2018-09-16 DIAGNOSIS — Z9989 Dependence on other enabling machines and devices: Secondary | ICD-10-CM

## 2018-09-16 MED ORDER — ROSUVASTATIN CALCIUM 20 MG PO TABS: 20.0000 mg | ORAL_TABLET | Freq: Every day | ORAL | 3 refills | Status: DC

## 2018-09-16 NOTE — Progress Notes (Signed)
Virtual Visit via Video Note   This visit type was conducted due to national recommendations for restrictions regarding the COVID-19 Pandemic (e.g. social distancing) in an effort to limit this patient's exposure and mitigate transmission in our community.  Due to his co-morbid illnesses, this patient is at least at moderate risk for complications without adequate follow up.  This format is felt to be most appropriate for this patient at this time.  All issues noted in this document were discussed and addressed.  A limited physical exam was performed with this format.  Please refer to the patient's chart for his consent to telehealth for [facility].   Date:  09/16/2018   ID:  Stephen [facility], DOB [birth year], MRN [id number]  Patient Location: Home Provider Location: Office  PCP:  [facility]  Cardiologist:  Stephen Majestic, MD  Electrophysiologist:  None   Evaluation Performed:  Follow-Up Visit  Chief Complaint:  14  Month follow-up cardiology evaluation  History of Present Illness:    Stephen Walton is a 63 y.o. 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 He has a history of obesity, sleep apnea on CPAP therapy, and in 2013 developed episodes of chest pain with some atypical features. This occurred shortly after his brother suffered a cardiac arrest. An echo Doppler study revealed mild concentric LVH with normal systolic and diastolic function. He did have mild aortic valve sclerosis without stenosis. A nuclear perfusion study on 10/04/2011 showed normal perfusion.  He has  metabolic syndrome. In March 2014 an NMR lipoprofile showed an increase LDL particle number at 1647 with an LDL of 132. He had 736 small LDL particles. His insulin resistance was 53. HDL was 53. Normal HDL particle #37.2. He has been on simvastatin 20 mg since that time. Subsequent blood work  showed marked improvement with a total cholesterol 146 triglycerides 114 HDL 49 LDL cholesterol 74. [name] walks at least 5 miles daily. However, despite this has been difficult for him  to lose weight. He denies recurrent chest pain.   He admits to 100% CPAP use.  He is the father of Stephen [facility], who previously had worked for [employer] [facility] in [employer].  When I saw him several years ago his brother recently died with a myocardial infarction.  [name] but had lost 20 pounds after he  was given phentermine, which assisted him in his weight loss by a former physician at [employer] and he had seen a nutritionist.  After the initial physician left, the new physician who had taken her place would not reinstitute this therapy.  He typically walks approximately 5 miles per day at work at [employer].  He had gained significant weight.  When he was placed on Lexapro for depression but due to this.  He stopped taking the Lexapro and lost 20 pounds as noted above.  In 2016 since his CPAP machine was very old, and was not downloadable, I  recommended he undergo a follow-up sleep study. This was done in October 2016.  During that study , he was felt to have increased upper airway resistance, but did not meet criteria for obstructive sleep apnea.  Last year, he apparently underwent a home study, which he brought with him and I reviewed. This showed an AHI of 14.3 with an RDI of 20.5 in June 2017.  Since then he has  been on a CPAP auto unit which was supplied by [facility].  He had blood work at [facility] by Dr. Annie [facility] a [facility] which I reviewed, which was obtained last April 2017.  Hemoglobin 16.9, hematocrit 50.5.  BUN 15, creatinine 1.02.  Glucose 90.  Total cholesterol 146, triglycerides 75, HDL 50, LDL 76.  TSH was 1.18.  I last saw him in May 2019 at which time he remained active. He is not sleeping adequate duration due to his night shift.  He continues to use CPAP.  He  brought a download from his app to the office today.  He is averaging 5 hours and 57 minutes of sleep per night.  AHI is excellent at 1.4.  At times he does have a mask leak.  He recently underwent laboratory at [employer] on June 04, 2017.  Hemoglobin was 14.2 hematocrit 44.8.  MCV low at 75.  Renal function was normal with a creatinine 1.09.  Total cholesterol 131, triglycerides 76, HDL 47, LDL 69.  Hemoglobin A1c was 5.8.  He is followed every 6 months by Dr. Diona Walton.  PSA was 6.7.  He also is on testosterone injections.  He frequently donates red blood cells.   [place] saw [place]  [name] had died.  He has been using CPAP therapy.  His supply company is [employer] in [place].  He has a ResMed CPAP machine and has been using a Quatro full facemask.  He admits compliance.  His daughter Stephen Walton had interrogated his unit and his AHI is 2.48.  Over the last 30 days his average sleep duration was 6 hours and 7 minutes.  He underwent laboratory at work by Dr. Robby Walton as well as an EKG which apparently showed normal sinus rhythm with a QT interval at 376.  BUN 21 creatinine 1.01.  Most recent lipid studies have shown total cholesterol 174, triglycerides 90, LDL 105, HDL 51 on his current dose of simvastatin.  He denies any chest pain or shortness of breath.  His peak weight was 260 and he has reduced his weight to 237 and continues to be on Saxenda.  He sees Dr. Diona [facility] for his BPH.  At times he does admit to some mild lower extremity fluid retention at the end of the day.  He presents for evaluation.    The patient does not have symptoms concerning for COVID-19 infection (fever, chills, cough, or new shortness of breath).    Past Medical History:  Diagnosis Date   Chest pain, atypical    H/O echocardiogram 10/04/2011   EF >55%;mild concentric LVH; normals systolic & diastolic dysfunction; mild aortic stenosis    History of nuclear stress test 10/04/2011   low risk; small amount of basal inferior bowel artifact   Hyperlipidemia    OSA on CPAP    No past surgical history on file.   Current Meds  Medication Sig   aspirin 81 MG tablet Take 81 mg by mouth daily.   buPROPion (WELLBUTRIN XL) 300 MG 24 hr tablet Take 1 tablet by mouth daily.   busPIRone (BUSPAR) 15 MG tablet Take 1 tablet by mouth daily.   fish oil-omega-3 fatty acids 1000 MG capsule Take 1 g by mouth daily.   Liraglutide -Weight Management (SAXENDA) 18 MG/3ML SOPN Inject 3 mg as directed daily.   NON FORMULARY at bedtime. CPAP   simvastatin (ZOCOR) 20 MG tablet TAKE (1) TABLET BY  MOUTH AT BEDTIME.     Allergies:   Patient has no known allergies.   Social History   Tobacco Use   Smoking status: Former Smoker   Smokeless tobacco: Never Used   Tobacco comment: quit 30 years ago.  Substance Use Topics   Alcohol use: Yes    Comment: 1-2 beers per month   Drug use: Not on file     Family Hx: The patient's family history includes Heart attack in his brother.  ROS:   Please see the history of present illness.    No fevers chills night sweats No cough Breathing is normal and not labored No wheezing No chest wall tenderness by palpation Central adiposity No significant edema OSA on CPAP All other systems reviewed and are negative.   Prior CV studies:   The following studies were reviewed today:  I reviewed his laboratory and ECG which he read out to me during this visit  Labs/Other Tests and Data Reviewed:    EKG: NSR  Recent Labs: No results found for requested labs within last 8760 hours.   Recent Lipid Panel No results found for: CHOL, TRIG, HDL, CHOLHDL, LDLCALC, LDLDIRECT  Wt Readings from Last 3 Encounters:  09/16/18 237 lb (107.5 kg)  07/23/17 246 lb 12.8 oz (111.9 kg)  05/02/16 259 lb (117.5 kg)     Objective:    Vital Signs:  BP 135/72    Pulse 78    Temp 97.9 F (36.6 C)    Ht 5'  9" (1.753 m)    Wt 237 lb (107.5 kg)    SpO2 98%    BMI 35.00 kg/m    Well-developed well-nourished, mesomorphic no acute distress HEENT unremarkable Neck neck Breathing normal No audible wheezing No chest wall discomfort Central adiposity No significant edema Grossly normal neurologically Normal affect and mood  ASSESSMENT & PLAN:    1. History of atypical chest pain: Currently stable no recent recurrence 2. Mild blood pressure elevation: 135/72 when taken today, not on therapy 3. OSA on CPAP: 100% compliance.  Most recent AHI 2.6 4. Hyperlipidemia: Most recent lipid studies reviewed.  With a strong family history for CAD and history of  metabolic syndrome, recommend target LDL less than 70.  Recommend discontinuance of simvastatin and change to rosuvastatin 20 mg; will recheck laboratory with chemistry and lipid studies in 3 to 4 months. 5. BPH, status post several prostate biopsies by Dr. Diona [facility] 6. Obesity: Discussed the importance of continued weight loss.  He has lost 23 pounds and continues to take Saxenda prescribed by his primary provider  COVID-19 Education: The signs and symptoms of COVID-19 were discussed with the patient and how to seek [facility] for testing (follow up with PCP or arrange E-visit).  The importance of social distancing was discussed today.  Time:   Today, I have spent 26 minutes with the patient with telehealth technology discussing the above problems.     Medication Adjustments/Labs and Tests Ordered: Current medicines are reviewed at length with the patient today.  Concerns regarding medicines are outlined above.   Tests Ordered: No orders of the defined types were placed in this encounter.   Medication Changes: No orders of the defined types were placed in this encounter.   Follow Up: 6 months Signed, Stephen Majestic, MD  09/16/2018 1:57 PM    [facility]

## 2018-09-16 NOTE — Patient Instructions (Signed)
Medication Instructions:  Stop Simvastatin  Start Rosuvastatin 20 mg daily  If you need a refill on your cardiac medications before your next appointment, please call your pharmacy.   Lab work: CMET, LIPID in 3 months.  Attached are the lab orders that are needed before your upcoming appointment, please come in 3 months anytime to have your labs drawn.   They are fasting labs, so nothing to eat or drink after midnight.  Lab hours: 8:00-4:00 lunch hours 12:45-1:45  If you have labs (blood work) drawn today and your tests are completely normal, you will receive your results only by: Marland Kitchen MyChart Message (if you have MyChart) OR . A paper copy in the mail If you have any lab test that is abnormal or we need to change your treatment, we will call you to review the results.  Follow-Up: At [facility], you and your health needs are our priority.  As part of our continuing mission to provide you with exceptional heart care, we have created designated Provider Care Teams.  These Care Teams include your primary Cardiologist (physician) and Advanced Practice Providers (APPs -  Physician Assistants and Nurse Practitioners) who all work together to provide you with the care you need, when you need it. You will need a follow up appointment in 6 months.  Please call our office 2 months in advance to schedule this appointment.  You may see Shelva Majestic, MD or one of the following Advanced Practice Providers on your designated Care Team: [place], [place] . Fabian Sharp, PA-C

## 2018-09-23 ENCOUNTER — Other Ambulatory Visit: Payer: Self-pay

## 2018-09-23 ENCOUNTER — Ambulatory Visit (INDEPENDENT_AMBULATORY_CARE_PROVIDER_SITE_OTHER): Payer: BC Managed Care – PPO | Admitting: Urology

## 2018-09-23 DIAGNOSIS — E291 Testicular hypofunction: Secondary | ICD-10-CM | POA: Diagnosis not present

## 2018-09-23 DIAGNOSIS — C61 Malignant neoplasm of prostate: Secondary | ICD-10-CM | POA: Diagnosis not present

## 2018-11-05 ENCOUNTER — Telehealth: Payer: Self-pay | Admitting: Cardiovascular Disease

## 2018-11-05 NOTE — Telephone Encounter (Signed)
Per pt call needs Cpap machine to [employer] ...  Please give them with any question.   Fax#  [phone]

## 2019-03-22 ENCOUNTER — Other Ambulatory Visit: Payer: Self-pay | Admitting: Urology

## 2019-03-23 ENCOUNTER — Other Ambulatory Visit: Payer: Self-pay | Admitting: Urology

## 2019-03-23 LAB — ESTRADIOL: Estradiol: 32 pg/mL (ref <=39)

## 2019-03-23 LAB — COMPLETE METABOLIC PANEL WITH GFR
AG Ratio: 1.6 (calc) (ref 1.0–2.5)
ALT: 17 U/L (ref 9–46)
AST: 19 U/L (ref 10–35)
Albumin: 4.5 g/dL (ref 3.6–5.1)
Alkaline phosphatase (APISO): 75 U/L (ref 35–144)
BUN: 18 mg/dL (ref 7–25)
CO2: 25 mmol/L (ref 20–32)
Calcium: 9.3 mg/dL (ref 8.6–10.3)
Chloride: 104 mmol/L (ref 98–110)
Creat: 1.02 mg/dL (ref 0.70–1.25)
GFR, Est African American: 90 mL/min/{1.73_m2} (ref >=60)
GFR, Est Non African American: 78 mL/min/{1.73_m2} (ref >=60)
Globulin: 2.8 g/dL (calc) (ref 1.9–3.7)
Glucose, Bld: 88 mg/dL (ref 65–139)
Potassium: 4.3 mmol/L (ref 3.5–5.3)
Sodium: 138 mmol/L (ref 135–146)
Total Bilirubin: 0.7 mg/dL (ref 0.2–1.2)
Total Protein: 7.3 g/dL (ref 6.1–8.1)

## 2019-03-23 LAB — PSA: PSA: 3.9 ng/mL (ref <=4.0)

## 2019-03-23 LAB — TESTOSTERONE TOTAL,FREE,BIO, MALES
Albumin: 4.5 g/dL (ref 3.6–5.1)
Sex Hormone Binding: 31 nmol/L (ref 22–77)
Testosterone, Bioavailable: 71 ng/dL — ABNORMAL LOW (ref >=110.0)
Testosterone, Free: 34.5 pg/mL — ABNORMAL LOW (ref 46.0–224.0)
Testosterone: 262 ng/dL (ref 250–827)

## 2019-03-23 LAB — CBC
HCT: 50.6 % — ABNORMAL HIGH (ref 38.5–50.0)
Hemoglobin: 15.8 g/dL (ref 13.2–17.1)
MCH: 23.9 pg — ABNORMAL LOW (ref 27.0–33.0)
MCHC: 31.2 g/dL — ABNORMAL LOW (ref 32.0–36.0)
MCV: 76.6 fL — ABNORMAL LOW (ref 80.0–100.0)
MPV: 9.6 fL (ref 7.5–12.5)
Platelets: 296 10*3/uL (ref 140–400)
RBC: 6.61 10*6/uL — ABNORMAL HIGH (ref 4.20–5.80)
RDW: 18.4 % — ABNORMAL HIGH (ref 11.0–15.0)
WBC: 8.4 10*3/uL (ref 3.8–10.8)

## 2019-03-24 ENCOUNTER — Ambulatory Visit (INDEPENDENT_AMBULATORY_CARE_PROVIDER_SITE_OTHER): Payer: 59 | Admitting: Urology

## 2019-03-24 ENCOUNTER — Encounter: Payer: Self-pay | Admitting: Urology

## 2019-03-24 VITALS — BP 147/91 | HR 75 | Temp 97.5°F | Ht 69.0 in | Wt 245.0 lb

## 2019-03-24 DIAGNOSIS — C61 Malignant neoplasm of prostate: Secondary | ICD-10-CM | POA: Diagnosis not present

## 2019-03-24 DIAGNOSIS — E291 Testicular hypofunction: Secondary | ICD-10-CM | POA: Diagnosis not present

## 2019-03-24 MED ORDER — TESTOSTERONE CYPIONATE 200 MG/ML IM SOLN: 100.0000 mg | INTRAMUSCULAR | 3 refills | Status: DC

## 2019-03-24 NOTE — Progress Notes (Signed)
03/24/2019 3:14 PM   Stephen [facility] 01-05-1956 [id number]  Referring provider: [facility] [address] [place],  [place] [address]  Hypogonadism and prostate cancer  HPI: Stephen [facility] is a 64yo with a hx prostate cancer and hypogonadism here for followup. PSA 3.9 which is stable. Testosterone 262, estradiol 39, Hgb 15.8, CMP normal. He injects 100mg  weekly. Energy good. No ED. No significant LUTS.   His previous record from [facility]: He was now on Testopel implants-10 pellets every 3 months. He felt like this gave him a better, more even feeling/treatment effect.   Because of insurance issues, he can only receive 6 pellets of Testopel every 3 months. Most recent trough testosterone level was 179.   He was switched to injectable testosterone in late 2018. His most recent testosterone level, apparently drawn before his injection of 0.5 ml was 975.   08/27/2017: Testosterone 556, hgb 13.4. PSA 4.6. Good energy   12/10/2017: Hgb 13, testosterone 682. good energy. Mild ED. He injects 100mg  weekly    03/04/2018: Testosterone 576, hgb 14.3   09/23/2018: He injects 100mg  every week. hemoglobin 14.8. estradiol 48, PSA 3.8, testosterone 436   CC: I have prostate cancer (treatment). HPI: He is participating in active surveillance. He has been under active surveillance for 12/13/2013. He has had a repeat biopsy. He did not have surgery. He did not receive radiation therapy for his cancer. He has not undergone Hormonal Therapy for treatment. He did not undergo chemotherapy for treatment of his prostate cancer.   His PSA blood tests have been low since his prostate cancer treatment was started.   He does have problems with erections. He does not have urinary incontinence. He does have an abnormal sensation when needing to urinate. He does have to strain or bear down to start his urinary stream.   He is not having pain in new locations. He does have a good  appetite. BOWEL HABITS: his bowels are moving normally. He has not seen blood in his stool since the biopsy. He has recently had unwanted weight loss.   He underwent ultrasound and biopsy of his prostate following his PSA returning 5.2. Biopsy was performed on 12/13/2013. Prostatic volume 86 mL. PSAD 0.06.1/12 cores were positive for adenocarcinoma -right base mid, Gleason 3+3 equal 6, 30% of core. 3 cores revealed a small focus of atypia.  Oncotype DX was sent-this revealed a GPS score of 17--a very low risk of having high-grade disease.  He had a prostate MRI in March, 2016. This revealed no areas suspicious for high-grade disease, no evidence of extracapsular or neurovascular involvement, no pelvic lymphadenopathy and no areas suspicious for osseous metastatic disease. This was followed by a surveillance ultrasound and biopsy on 05/24/2014. Prostatic volume was 77 mL 2 cores revealed atypical findings, both at the apex of the prostate. No discrete prostate cancer was found.   04/29/2017: His most recent PSA is 6.8. He is having no significant voiding symptomatology.   08/27/2017: Testosterone 556, PSa 4.6 which is stable   03/04/2018: Prostate MRI showed no concerning lesions for high grade prostate cancer. PSA 3.2   09/23/2018: PSA stable at 3.8    PMH: Past Medical History:  Diagnosis Date  . Chest pain, atypical   . H/O echocardiogram 10/04/2011   EF >55%;mild concentric LVH; normals systolic & diastolic dysfunction; mild aortic stenosis  . History of nuclear stress test 10/04/2011   low risk; small amount of basal inferior bowel artifact  .  Hyperlipidemia   . OSA on CPAP     Surgical History: No past surgical history on file.  Home Medications:  Allergies as of 03/24/2019      Reactions   No Known Allergies       Medication List       Accurate as of March 24, 2019  3:14 PM. If you have any questions, ask your nurse or doctor.        aspirin 81 MG tablet Take 81 mg by mouth  daily.   buPROPion 300 MG 24 hr tablet Commonly known as: WELLBUTRIN XL Take 1 tablet by mouth daily.   busPIRone 15 MG tablet Commonly known as: BUSPAR Take 1 tablet by mouth daily.   fish oil-omega-3 fatty acids 1000 MG capsule Take 1 g by mouth daily.   NON FORMULARY at bedtime. CPAP   rosuvastatin 20 MG tablet Commonly known as: CRESTOR Take 1 tablet (20 mg total) by mouth daily.   Saxenda 18 MG/3ML Sopn Generic drug: Liraglutide -Weight Management Inject 3 mg as directed daily.       Allergies:  Allergies  Allergen Reactions  . No Known Allergies     Family History: Family History  Problem Relation Age of Onset  . Heart attack Brother     Social History:  reports that he has quit smoking. He has never used smokeless tobacco. He reports current alcohol use. No history on file for drug.  ROS:                                        Physical Exam: BP (!) 147/91   Pulse 75   Temp (!) 97.5 F (36.4 C)   Ht 5\' 9"  (1.753 m)   Wt 245 lb (111.1 kg)   BMI 36.18 kg/m   Constitutional:  Alert and oriented, No acute distress. HEENT: [place] AT, moist mucus membranes.  Trachea midline, no masses. Cardiovascular: No clubbing, cyanosis, or edema. Respiratory: Normal respiratory effort, no increased work of breathing. GI: Abdomen is soft, nontender, nondistended, no abdominal masses GU: No CVA tenderness Lymph: No cervical or inguinal lymphadenopathy. Skin: No rashes, bruises or suspicious lesions. Neurologic: Grossly intact, no focal deficits, moving all 4 extremities. Psychiatric: Normal mood and affect.  Laboratory Data: No results found for: WBC, HGB, HCT, MCV, PLT  No results found for: CREATININE  No results found for: PSA  No results found for: TESTOSTERONE  No results found for: HGBA1C  Urinalysis No results found for: COLORURINE, APPEARANCEUR, LABSPEC, PHURINE, GLUCOSEU, HGBUR, BILIRUBINUR, KETONESUR, PROTEINUR, UROBILINOGEN,  NITRITE, LEUKOCYTESUR  No results found for: LABMICR, [place], RBCUA, LABEPIT, MUCUS, BACTERIA  Pertinent Imaging:  No results found for this or any previous visit. No results found for this or any previous visit. No results found for this or any previous visit. No results found for this or any previous visit. Results for orders placed during the hospital encounter of 04/24/15  US Renal   Narrative CLINICAL DATA:  Proteinuria  EXAM: RENAL / URINARY TRACT ULTRASOUND COMPLETE  COMPARISON:  None.  FINDINGS: Right Walton:  Length: 12.7 cm. Echogenicity within normal limits. No mass or hydronephrosis visualized.  Left Walton:  Length: 12.0 cm. Echogenicity within normal limits. No mass or hydronephrosis visualized.  Bladder:  Appears normal for degree of bladder distention.  IMPRESSION: Normal renal ultrasound.   Electronically Signed   By: Franchot Gallo M.D.  On: 04/24/2015 09:56    No results found for this or any previous visit. No results found for this or any previous visit. No results found for this or any previous visit.  Assessment & Plan:   1. Prostate cancer: -RTC 6 months with PSA  2. Hypogonadism: -continue IM testosterone 100mg  q 1 week -RTC 6 months with labs  No follow-ups on file.  Nicolette Bang, MD  [facility] [place]

## 2019-03-24 NOTE — Patient Instructions (Signed)
Prostate Cancer  The prostate is a male gland that helps make semen. Prostate cancer is when abnormal cells grow in this gland. Follow these instructions at home:  Take over-the-counter and prescription medicines only as told by your doctor.  Eat a healthy diet.  Get plenty of sleep.  Ask your doctor for help to find a support group for men with prostate cancer.  Keep all follow-up visits as told by your doctor. This is important.  If you have to go to the hospital, let your cancer doctor (oncologist) know.  Touch, hold, hug, and caress your partner to continue to show sexual feelings. Contact a doctor if:  You have trouble peeing (urinating).  You have blood in your pee (urine).  You have pain in your hips, back, or chest. Get help right away if:  You have weakness in your legs.  You lose feeling (have numbness) in your legs.  You cannot control your pee or your poop (stool).  You have trouble breathing.  You have sudden pain in your chest.  You have chills or a fever. Summary  The prostate is a male gland that helps make semen. Prostate cancer is when abnormal cells grow in this gland.  Ask your doctor for help to find a support group for men with prostate cancer.  Contact a doctor if you have problems peeing or have any new pain that you did not have before. This information is not intended to replace advice given to you by your health care provider. Make sure you discuss any questions you have with your health care provider. Document Revised: 02/14/2017 Document Reviewed: 11/13/2015 Elsevier Patient Education  2020 Elsevier Inc.  

## 2019-03-24 NOTE — Progress Notes (Signed)
Urological Symptom Review  Patient is experiencing the following symptoms: Frequent urination   Review of Systems  Gastrointestinal (upper)  : Negative for upper GI symptoms  Gastrointestinal (lower) : Negative for lower GI symptoms  Constitutional : Negative for symptoms  Skin: Negative for skin symptoms  Eyes: Negative for eye symptoms  Ear/Nose/Throat : negative  Hematologic/Lymphatic: Negative for Hematologic/Lymphatic symptoms  Cardiovascular : Negative for cardiovascular symptoms  Respiratory : Negative for respiratory symptoms  Endocrine: Negative for endocrine symptoms  Musculoskeletal: negative  Neurological: Dizziness Negative for neurological symptoms  Psychologic: Negative for psychiatric symptoms negative

## 2019-03-30 ENCOUNTER — Other Ambulatory Visit: Payer: Self-pay

## 2019-03-30 ENCOUNTER — Ambulatory Visit (INDEPENDENT_AMBULATORY_CARE_PROVIDER_SITE_OTHER): Payer: 59 | Admitting: Cardiovascular Disease

## 2019-03-30 ENCOUNTER — Encounter: Payer: Self-pay | Admitting: Cardiovascular Disease

## 2019-03-30 VITALS — BP 116/78 | HR 79 | Temp 97.3°F | Ht 69.0 in | Wt 243.0 lb

## 2019-03-30 DIAGNOSIS — E785 Hyperlipidemia, unspecified: Secondary | ICD-10-CM | POA: Diagnosis not present

## 2019-03-30 DIAGNOSIS — E8881 Metabolic syndrome: Secondary | ICD-10-CM

## 2019-03-30 DIAGNOSIS — R0789 Other chest pain: Secondary | ICD-10-CM

## 2019-03-30 DIAGNOSIS — F419 Anxiety disorder, unspecified: Secondary | ICD-10-CM | POA: Diagnosis not present

## 2019-03-30 DIAGNOSIS — E669 Obesity, unspecified: Secondary | ICD-10-CM

## 2019-03-30 DIAGNOSIS — G4733 Obstructive sleep apnea (adult) (pediatric): Secondary | ICD-10-CM | POA: Diagnosis not present

## 2019-03-30 NOTE — Progress Notes (Signed)
Patient ID: DUVID FAVRET, male   DOB: [birth year], 64 y.o.   MRN: [id number]       HPI: Stephen Walton is a 64 y.o. male who presents for a 26-month follow-up cardiology evaluation.   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 He has a history of obesity, sleep apnea on CPAP therapy, and in 2013 developed episodes of chest pain with some atypical features. This occurred shortly after his brother suffered a cardiac arrest. An echo Doppler study revealed mild concentric LVH with normal systolic and diastolic function. He did have mild aortic valve sclerosis without stenosis. A nuclear perfusion study done on 10/04/2011 showed normal perfusion.  He has the metabolic syndrome. In March 2014 an NMR lipoprofile showed an increase LDL particle number at 1647 with an LDL of 132. He had 736 small LDL particles. His insulin resistance was 53. HDL was 53. Normal HDL particle #37.2. He has been on simvastatin 20 mg since that time. Subsequent blood work showed marked improvement with a total cholesterol 146 triglycerides 114 HDL 49 LDL cholesterol 74. Stephen Walton walks at least 5 miles daily. However, despite this has been difficult for him  to lose weight. He denies recurrent chest pain.   He admits to 100% CPAP use.  He is the father of Stephen Walton, who previously had worked for [employer] in [employer].  Over the past 2 years, he has gained approximately 10 additional pounds of weight despite his exercise. He has had recent laboratory done by Dr. Orson Ape in [place]. He denies recurrent episodes of chest pain. He denies palpitations. He denies presyncope or syncope.  When I saw him several years ago his brother recently died with a myocardial infarction.  Stephen Walton had gained some weight but had lost 20 poundsafter he  was given phentermine, which assisted him in his weight loss by a former physician at  [facility] [employer] and he had seen a nutritionist.  After the physician left the new physician who had taken her place would not reinstitute this therapy.  He continues to walk approximately 5 miles per day at work at [employer].  He had gained significant weight.  When he was placed on Lexapro for depression but due to this.  He stopped taking the Lexapro and lost 20 pounds as noted above.  I last saw him, since his CPAP machine was very old, and was not downloadable, I had recommended he undergo a follow-up sleep study.  This was done in October 2016.  During that study , he was felt to have increased upper airway resistance, but did not meet criteria for obstructive sleep apnea.  Last year, he apparently underwent a home study, which he brought with him and I reviewed today.  This showed an AHI of 14.3 with an RDI of 20.5 in June 2017.  Since then he has been on a CPAP auto unit which was supplied by [employer].  He had blood work at [facility] by Dr. Annie Main a Robby Sermon which I reviewed, which was obtained last April 2017.  Hemoglobin 16.9, hematocrit 50.5.  BUN 15, creatinine 1.02.  Glucose 90.  Total cholesterol 146, triglycerides 75, HDL 50, LDL 76.  TSH was 1.18.  I saw him in February 2015 he complained of weight gain.  He is now 20 pounds above what he had weighed 2016.  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  His shift is from 6 PM to 6 AM.  He typically walks 5 miles at [facility].  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  He admits to some occasional left ankle swelling.  He was started on 6 Zander for appetite control by Delman Cheadle at [facility].  This has resulted in weight loss of approximately 16 pounds.  I saw him in May 2019 at which time he was not sleeping adequate duration due to his night shift.  He continues to use CPAP.  He brought a download from his app to the office today.  He is  averaging 5 hours and 57 minutes of sleep per night.  AHI is excellent at 1.4.  At times he does have a mask leak.  He recently underwent laboratory at [facility] on June 04, 2017.  Hemoglobin was 14.2 hematocrit 44.8.  MCV low at 75.  Renal function was normal with a creatinine 1.09.  Total cholesterol 131, triglycerides 76, HDL 47, LDL 69.  Hemoglobin A1c was 5.8.  He is followed every 6 months by Dr. Diona Fanti.  PSA was 6.7.  He also is on testosterone injections.  He tells me he has been undergoing blood donation every 2 months.    I last evaluated him on September 16, 2018 in a telemedicine visit. At that time he was still working [place] the airport.  [name] had died.  He wasusing CPAP therapy.  His supply company is [employer] in [place].  He has a ResMed CPAP machine and has been using a Quatro full facemask.  He admits compliance.  His daughter Stephen Walton had interrogated his unit and his AHI is 2.48.  Over the last 30 days his average sleep duration was 6 hours and 7 minutes.  He underwent laboratory at work by Dr. Robby Sermon as well as an EKG which apparently showed normal sinus rhythm with a QT interval at 376.  BUN 21 creatinine 1.01.  Most recent lipid studies have shown total cholesterol 174, triglycerides 90, LDL 105, HDL 51 on his current dose of simvastatin.  He denies any chest pain or shortness of breath.  His peak weight was 260 and he has reduced his weight to 237 and continues to be on Saxenda.  He sees Dr. Diona Fanti for his BPH.  At times he does admit to some mild lower extremity fluid retention at the end of the day.   Over the past 6 months, he has been without chest pain.  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 He plans to retire at the end of June.  He had just purchased a new [place].  He had recent laboratory at [facility] on March 22, 2019 which showed a hemoglobin 15.8, hematocrit 50.6, MCV 76.  Creatinine 1.02.  His anxiety is  better on medications.  He continues to get supplies from [employer].  I obtained a new download of his CPAP unit.  He is meeting compliance standards with average usage at 6 hours and 18 minutes.  His auto unit was set at a minimum pressure of 5 and maximum of 20, 95th percentile pressure was 11.7 with a maximum average pressure of 13.2.  AHI was 2.1.  He had recently purchased a new ResMed travel CPAP unit which he plans to use on trips after his retirement.  He has a  new Quatro full facemask.  He presents for evaluation.   Past Medical History:  Diagnosis Date  . Chest pain, atypical   . H/O echocardiogram 10/04/2011   EF >55%;mild concentric LVH; normals systolic & diastolic dysfunction; mild aortic stenosis  . History of nuclear stress test 10/04/2011   low risk; small amount of basal inferior bowel artifact  . Hyperlipidemia   . OSA on CPAP     History reviewed. No pertinent surgical history.  Allergies  Allergen Reactions  . No Known Allergies     Current Outpatient Medications  Medication Sig Dispense Refill  . aspirin 81 MG tablet Take 81 mg by mouth daily.    Marland Kitchen buPROPion (WELLBUTRIN XL) 300 MG 24 hr tablet Take 1 tablet by mouth daily.    . busPIRone (BUSPAR) 15 MG tablet Take 1 tablet by mouth daily.    . fish oil-omega-3 fatty acids 1000 MG capsule Take 1 g by mouth daily.    . Liraglutide -Weight Management (SAXENDA) 18 MG/3ML SOPN Inject 3 mg as directed daily.    . NON FORMULARY at bedtime. CPAP    . testosterone cypionate (DEPOTESTOSTERONE CYPIONATE) 200 MG/ML injection Inject 0.5 mLs (100 mg total) into the muscle every 7 (seven) days. 5 mL 3  . rosuvastatin (CRESTOR) 20 MG tablet Take 1 tablet (20 mg total) by mouth daily. 90 tablet 3   No current facility-administered medications for this visit.    Socially he is married has 4 children. 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 There is no tobacco use having quit over  30 years ago. There is nosignificant alcohol use  ROS General: Negative; No fevers, chills, or night sweats;  HEENT: Negative; No changes in vision or hearing, sinus congestion, difficulty swallowing Pulmonary: Negative; No cough, wheezing, shortness of breath, hemoptysis Cardiovascular: Negative; No chest pain, presyncope, syncope, palpitations GI: Negative; No nausea, vomiting, diarrhea, or abdominal pain GU: Enlarged prostate, status post biopsy; No dysuria, hematuria, or difficulty voiding Musculoskeletal: Negative; no myalgias, joint pain, or weakness Hematologic/Oncology: Negative; no easy bruising, bleeding Endocrine: Positive for metabolic syndrome; no heat/cold intolerance; no diabetes Neuro: Negative; no changes in balance, headaches Skin: Negative; No rashes or skin lesions Psychiatric: Depression following his brother's death Sleep: Positive for obstructive sleep apnea on CPAP with 100% compliance; recent breakthrough snoring, no daytime sleepiness, hypersomnolence, bruxism, restless legs, hypnogognic hallucinations, no cataplexy Other comprehensive 14 point system review is negative   Physical Exam BP 116/78 (BP Location: Left Arm, Patient Position: Sitting, Cuff Size: Large)   Pulse 79   Temp (!) 97.3 F (36.3 C)   Ht 5\' 9"  (1.753 m)   Wt 243 lb (110.2 kg)   BMI 35.88 kg/m    Repeat blood pressure by me was 106/72  Wt Readings from Last 3 Encounters:  03/30/19 243 lb (110.2 kg)  03/24/19 245 lb (111.1 kg)  09/16/18 237 lb (107.5 kg)   General: Alert, oriented, no distress.  Skin: normal turgor, no rashes, warm and dry; multiple tattoos of his previous canine [place] Shepherd HEENT: Normocephalic, atraumatic. Pupils equal round and reactive to light; sclera anicteric; extraocular muscles intact;  Nose without nasal septal hypertrophy Mouth/Parynx benign; Mallinpatti scale 3 Neck: No JVD, no carotid bruits; normal carotid upstroke Lungs: clear to ausculatation and  percussion; no wheezing or rales Chest wall: without tenderness to palpitation Heart: PMI not displaced, RRR, s1 s2 normal, 1/6 systolic murmur, no diastolic murmur, no rubs, gallops, thrills,  or heaves Abdomen: soft, nontender; no hepatosplenomehaly, BS+; abdominal aorta nontender and not dilated by palpation. Back: no CVA tenderness Pulses 2+ Musculoskeletal: full range of motion, normal strength, no joint deformities Extremities: no clubbing cyanosis or edema, Homan's sign negative  Neurologic: grossly nonfocal; Cranial nerves grossly wnl Psychologic: Normal mood and affect   ECG (independently read by me): Normal sinus rhythm at 79 bpm.  Normal intervals.  No ectopy.  No ST segment changes  May 2019 ECG (independently read by me): Normal sinus rhythm at 69 bpm.  No ectopy.  Normal intervals.  February 2018 ECG (independently read by me): Sinus rhythm at 85 bpm.  Rightward axis.  No signal ST changes.  September 2016 ECG (independently read by me): NSR at 76; normal intervals  ECG (independently read by me): Normal sinus rhythm at 77 beats per minute. No ectopy. Normal intervals.  Prior ECG of 09/28/2012: Sinus rhythm at 61 beats per minute; normal intervals  LABS:  I reviewed recent blood work by Dr. Julianne Handler, M.D., from 07/14/2014.   Hemoglobin 17.3, hematocrit 51.6.  Fasting glucose 92.  BUN 17, currently 0.9.  Normal LFTs.  Total cluster 153, triglycerides 90, HDL 52, LDL 83.  PSA 2.1.  TSH 1.12.  I also reviewed the blood work from April 2017, which is noted above.  Laboratory from [facility] from June 04, 2017 was reviewed as dictated above in HPI  Laboratory from March 22, 2019 was reviewed  BMET  BMP Latest Ref Rng & Units 03/22/2019  Glucose 65 - 139 mg/dL 88  BUN 7 - 25 mg/dL 18  Creatinine 0.70 - 1.25 mg/dL 1.02  BUN/Creat Ratio 6 - 22 (calc) NOT APPLICABLE  Sodium A999333 - 146 mmol/L 138  Potassium 3.5 - 5.3 mmol/L 4.3  Chloride 98 - 110 mmol/L 104   CO2 20 - 32 mmol/L 25  Calcium 8.6 - 10.3 mg/dL 9.3    Hepatic Function Latest Ref Rng & Units 03/22/2019  Total Protein 6.1 - 8.1 g/dL 7.3  AST 10 - 35 U/L 19  ALT 9 - 46 U/L 17  Total Bilirubin 0.2 - 1.2 mg/dL 0.7    CBC  CBC Latest Ref Rng & Units 03/22/2019  WBC 3.8 - 10.8 Thousand/uL 8.4  Hemoglobin 13.2 - 17.1 g/dL 15.8  Hematocrit 38.5 - 50.0 % 50.6(H)  Platelets 140 - 400 Thousand/uL 296    BNP No results found for: PROBNP  Lipid Panel  No results found for: CHOL   RADIOLOGY: No results found.  IMPRESSION:  1. Atypical chest pain   2. OSA (obstructive sleep apnea)   3. Hyperlipidemia, unspecified hyperlipidemia type   4. Anxiety   5. Obesity (BMI 35.0-39.9 without comorbidity)   6. 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  He is had difficulty with weight gain over the past several years.  He has seen a nutritionist and has been trying to carefully watch what he eats.  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  However, since his [place] past he does not have a dog with him.  He is planning retirement in June 2021.  He has a history of obstructive sleep apnea, anxiety, obesity, and hyperlipidemia.  At times he has experienced a vague chest sensation which is not felt to be ischemic in etiology.  He continues to  be on rosuvastatin 20 mg daily and is tolerating this well.  I reviewed his most recent laboratory from March 22, 2019 done at [place] by Dr. Alyson Ingles.  Lipid studies do not appear to be done at that time.  He continues to use CPAP and is continuing to be compliant.  Since he works the night shift, he typically has been going to bed around 6:30 AM and wakes up around 12:30 in the afternoon.  He has been averaging around 6 hours and 18 minutes of CPAP use.  He recently obtained a new Quatro  full facemask.  [facility].  Most recent download shows an AHI of 2.1.  I am recommending changes to his auto set such that instead of starting at a minimum pressure of 5 he will commence at 8 cm water pressure.  His most recent 95th percentile pressure was 11.7.  We discussed increased exercise and weight loss.  His daughter who had worked in the [place] for sleep apnea had recently undergone gastric bypass surgery and apparently has lost 100 pounds.  Stephen. Bagshaw blood pressure today is controlled.  His anxiety is improved with Wellbutrin XL 300 mg daily in addition to BuSpar 15 mg.  He also has been on Saxenda for weight management.  I will see him in October 2021 for follow-up evaluation.   Time spent: 25 minutes Stephen Sine, MD, [facility]  04/01/2019 10:28 AM

## 2019-03-30 NOTE — Patient Instructions (Signed)
Medication Instructions:  Your physician recommends that you continue on your current medications as directed. Please refer to the Current Medication list given to you today.  If you need a refill on your cardiac medications before your next appointment, please call your pharmacy.   Lab work: NONE  Testing/Procedures: NONE  Follow-Up: At [facility], you and your health needs are our priority.  As part of our continuing mission to provide you with exceptional heart care, we have created designated Provider Care Teams.  These Care Teams include your primary Cardiologist (physician) and Advanced Practice Providers (APPs -  Physician Assistants and Nurse Practitioners) who all work together to provide you with the care you need, when you need it. You may see Shelva Majestic, MD or one of the following Advanced Practice Providers on your designated Care Team:    Almyra Deforest, PA-C  Fabian Sharp, [place] or   Roby Lofts, [place] Your physician wants you to follow-up in: 10 months with Dr Claiborne Billings in office  Any Other Special Instructions Will Be Listed Below (If Applicable). You will be turning up the CPAP machine to 8 at the minimum.

## 2019-04-01 ENCOUNTER — Encounter: Payer: Self-pay | Admitting: Cardiovascular Disease

## 2019-04-29 ENCOUNTER — Ambulatory Visit (INDEPENDENT_AMBULATORY_CARE_PROVIDER_SITE_OTHER): Payer: Commercial Managed Care - PPO

## 2019-04-29 ENCOUNTER — Other Ambulatory Visit: Payer: Self-pay

## 2019-04-29 ENCOUNTER — Ambulatory Visit
Admission: EM | Admit: 2019-04-29 | Discharge: 2019-04-29 | Disposition: A | Discharge status: Home / Self Care | Payer: 59 | Attending: Emergency Medicine | Admitting: Emergency Medicine

## 2019-04-29 ENCOUNTER — Ambulatory Visit: Payer: Commercial Managed Care - PPO

## 2019-04-29 DIAGNOSIS — M25511 Pain in right shoulder: Secondary | ICD-10-CM

## 2019-04-29 DIAGNOSIS — W19XXXA Unspecified fall, initial encounter: Secondary | ICD-10-CM

## 2019-04-29 MED ORDER — PREDNISONE 20 MG PO TABS: 20.0000 mg | ORAL_TABLET | Freq: Two times a day (BID) | ORAL | 0 refills | Status: AC

## 2019-04-29 NOTE — ED Provider Notes (Signed)
[facility]   [id number] 04/29/19 Arrival Time: [id number]  CC: RT shoulder pain  SUBJECTIVE: History from: patient. Stephen Walton is a 64 y.o. male complains of right shoulder pain x 1 day.  Symptoms began after he slowly slipped down while getting out of the bathtub, landing on RT elbow and jarring RT shoulder.  Localizes the pain to the front of shoulder.  Describes the pain as intermittent and achy in character.  Has NOT tried OTC medications.  Symptoms are made worse with reaching overhead.  Denies similar symptoms in the past.  Denies fever, chills, CP, SOB, erythema, ecchymosis, effusion, weakness, numbness and tingling.    ROS: As per HPI.  All other pertinent ROS negative.     Past Medical History:  Diagnosis Date  . Chest pain, atypical   . H/O echocardiogram 10/04/2011   EF >55%;mild concentric LVH; normals systolic & diastolic dysfunction; mild aortic stenosis  . History of nuclear stress test 10/04/2011   low risk; small amount of basal inferior bowel artifact  . Hyperlipidemia   . OSA on CPAP    History reviewed. No pertinent surgical history. Allergies  Allergen Reactions  . No Known Allergies    No current facility-administered medications on file prior to encounter.   Current Outpatient Medications on File Prior to Encounter  Medication Sig Dispense Refill  . aspirin 81 MG tablet Take 81 mg by mouth daily.    Marland Kitchen buPROPion (WELLBUTRIN XL) 300 MG 24 hr tablet Take 1 tablet by mouth daily.    . busPIRone (BUSPAR) 15 MG tablet Take 1 tablet by mouth daily.    . fish oil-omega-3 fatty acids 1000 MG capsule Take 1 g by mouth daily.    . Liraglutide -Weight Management (SAXENDA) 18 MG/3ML SOPN Inject 3 mg as directed daily.    . NON FORMULARY at bedtime. CPAP    . rosuvastatin (CRESTOR) 20 MG tablet Take 1 tablet (20 mg total) by mouth daily. 90 tablet 3  . testosterone cypionate (DEPOTESTOSTERONE CYPIONATE) 200 MG/ML injection Inject 0.5 mLs (100 mg total) into  the muscle every 7 (seven) days. 5 mL 3   Social History   Socioeconomic History  . Marital status: Married    Spouse name: Not on file  . Number of children: 4  . Years of education: Not on file  . Highest education level: Not on file  Occupational History  . 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 Smokeless tobacco: Never Used  . Tobacco comment: quit 30 years ago.  Substance and Sexual Activity  . Alcohol use: Yes    Comment: 1-2 beers per month  . Drug use: Not on file  . Sexual activity: Not on file  Other Topics Concern  . Not on file  Social History Narrative  . Not on file   Social Determinants of Health   Financial Resource Strain:   . Difficulty of Paying Living Expenses: Not on file  Food Insecurity:   . Worried About Charity fundraiser in the Last Year: Not on file  . Ran Out of Food in the Last Year: Not on file  Transportation Needs:   . Lack of Transportation (Medical): Not on file  . Lack of Transportation (Non-Medical): Not on file  Physical Activity:   . Days of Exercise per Week: Not on file  . Minutes of Exercise per Session: Not on file  Stress:   .  Feeling of Stress : Not on file  Social Connections:   . Frequency of Communication with Friends and Family: Not on file  . Frequency of Social Gatherings with Friends and Family: Not on file  . Attends Religious Services: Not on file  . Active Member of Clubs or Organizations: Not on file  . Attends Archivist Meetings: Not on file  . Marital Status: Not on file  Intimate Partner Violence:   . Fear of Current or Ex-Partner: Not on file  . Emotionally Abused: Not on file  . Physically Abused: Not on file  . Sexually Abused: Not on file   Family History  Problem Relation Age of Onset  . Heart attack Brother     OBJECTIVE:  Vitals:   04/29/19 1456  BP: (!) 160/89  Pulse: 81  Resp: 16  Temp: 98.4 F (36.9 C)    TempSrc: Tympanic  SpO2: 98%    General appearance: ALERT; in no acute distress.  Head: NCAT Lungs: Normal respiratory effort CV: Radial pulse 2+ . Cap refill < 2 seconds Musculoskeletal: RT shoulder  Inspection: Skin warm, dry, clear and intact without obvious erythema, effusion, or ecchymosis.  Palpation: TTP over anterior shoulder ROM: LROM; 0-45 with passive ROM Strength: 5/5 shld abduction, 5/5 shld adduction, 5/5 elbow flexion, 5/5 elbow extension, 5/5 grip strength Skin: warm and dry Neurologic: Ambulates without difficulty; Sensation intact about the upper extremities Psychological: alert and cooperative; normal mood and affect  DIAGNOSTIC STUDIES:  DG Shoulder Right  Result Date: 04/29/2019 CLINICAL DATA:  Right shoulder pain since an injury when the patient fell getting out of the shower yesterday. Initial encounter. EXAM: RIGHT SHOULDER - 2+ VIEW COMPARISON:  None. FINDINGS: There is no evidence of fracture or dislocation. 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 Soft tissues are unremarkable. IMPRESSION: No acute abnormality. 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 Electronically Signed   By: Inge Rise M.D.   On: 04/29/2019 15:17    X-rays negative for bony abnormalities including fracture, or dislocation.  No soft tissue swelling.    I have reviewed the x-rays myself and the radiologist interpretation. I am in agreement with the radiologist interpretation.     ASSESSMENT & PLAN:  1. Acute pain of right shoulder   2. Fall, initial encounter    Meds ordered this encounter  Medications  . predniSONE (DELTASONE) 20 MG tablet    Sig: Take 1 tablet (20 mg total) by mouth 2 (two) times daily with a meal for 5 days.    Dispense:  10 tablet    Refill:  0    Order Specific Question:   Supervising Provider    Answer:   Raylene Everts [id number]   X-rays negative for fracture or  dislocation Continue conservative management of rest, ice, and gentle stretches Prednisone prescribed.  Take as directed and to completion Follow up with orthopedist on Monday for recheck and to ensure symptoms are improving Return or go to the ER if you have any new or worsening symptoms (fever, chills, chest pain, bruising, swelling, redness, worsening symptoms despite treatment, etc...)   Work note given today  Reviewed expectations re: course of current medical issues. Questions answered. Outlined signs and symptoms indicating need for more acute intervention. Patient verbalized understanding. After Visit Summary given.    Lestine Box, PA-C 04/29/19 1543

## 2019-04-29 NOTE — ED Triage Notes (Signed)
Pt presents with right shoulder pain after fall in shower yesterday, pain is worsening and has limited range of motion

## 2019-04-29 NOTE — Discharge Instructions (Addendum)
X-rays negative for fracture or dislocation Continue conservative management of rest, ice, and gentle stretches Prednisone prescribed.  Take as directed and to completion Follow up with orthopedist on Monday for recheck and to ensure symptoms are improving Return or go to the ER if you have any new or worsening symptoms (fever, chills, chest pain, bruising, swelling, redness, worsening symptoms despite treatment, etc...)   Work note given today

## 2019-09-01 ENCOUNTER — Other Ambulatory Visit: Payer: Self-pay

## 2019-09-01 MED ORDER — ROSUVASTATIN CALCIUM 20 MG PO TABS: 20.0000 mg | ORAL_TABLET | Freq: Every day | ORAL | 3 refills | Status: DC

## 2019-09-29 ENCOUNTER — Ambulatory Visit: Payer: Commercial Managed Care - PPO | Admitting: Urology

## 2019-10-21 ENCOUNTER — Other Ambulatory Visit: Payer: Self-pay | Admitting: Urology

## 2019-10-24 LAB — CBC WITH DIFFERENTIAL/PLATELET
Absolute Monocytes: 737 cells/uL (ref 200–950)
Basophils Absolute: 97 cells/uL (ref 0–200)
Basophils Relative: 1.2 %
Eosinophils Absolute: 348 cells/uL (ref 15–500)
Eosinophils Relative: 4.3 %
HCT: 45 % (ref 38.5–50.0)
Hemoglobin: 13.3 g/dL (ref 13.2–17.1)
Lymphs Abs: 1920 cells/uL (ref 850–3900)
MCH: 21.6 pg — ABNORMAL LOW (ref 27.0–33.0)
MCHC: 29.6 g/dL — ABNORMAL LOW (ref 32.0–36.0)
MCV: 72.9 fL — ABNORMAL LOW (ref 80.0–100.0)
MPV: 9.8 fL (ref 7.5–12.5)
Monocytes Relative: 9.1 %
Neutro Abs: 4998 cells/uL (ref 1500–7800)
Neutrophils Relative %: 61.7 %
Platelets: 350 10*3/uL (ref 140–400)
RBC: 6.17 10*6/uL — ABNORMAL HIGH (ref 4.20–5.80)
RDW: 18.7 % — ABNORMAL HIGH (ref 11.0–15.0)
Total Lymphocyte: 23.7 %
WBC: 8.1 10*3/uL (ref 3.8–10.8)

## 2019-10-24 LAB — COMPREHENSIVE METABOLIC PANEL
AG Ratio: 1.8 (calc) (ref 1.0–2.5)
ALT: 18 U/L (ref 9–46)
AST: 25 U/L (ref 10–35)
Albumin: 4.2 g/dL (ref 3.6–5.1)
Alkaline phosphatase (APISO): 72 U/L (ref 35–144)
BUN: 23 mg/dL (ref 7–25)
CO2: 28 mmol/L (ref 20–32)
Calcium: 9.5 mg/dL (ref 8.6–10.3)
Chloride: 105 mmol/L (ref 98–110)
Creat: 1.24 mg/dL (ref 0.70–1.25)
Globulin: 2.4 g/dL (calc) (ref 1.9–3.7)
Glucose, Bld: 100 mg/dL (ref 65–139)
Potassium: 4.8 mmol/L (ref 3.5–5.3)
Sodium: 139 mmol/L (ref 135–146)
Total Bilirubin: 0.8 mg/dL (ref 0.2–1.2)
Total Protein: 6.6 g/dL (ref 6.1–8.1)

## 2019-10-24 LAB — TESTOSTERONE: Testosterone: 382 ng/dL (ref 250–827)

## 2019-10-24 LAB — TESTOSTERONE, FREE: TESTOSTERONE FREE: 53.4 pg/mL (ref 46.0–224.0)

## 2019-10-24 LAB — PSA: PSA: 4 ng/mL (ref <=4.0)

## 2019-10-25 ENCOUNTER — Encounter: Payer: Self-pay | Admitting: Urology

## 2019-10-25 ENCOUNTER — Ambulatory Visit (INDEPENDENT_AMBULATORY_CARE_PROVIDER_SITE_OTHER): Payer: Commercial Managed Care - PPO | Admitting: Urology

## 2019-10-25 ENCOUNTER — Other Ambulatory Visit: Payer: Self-pay

## 2019-10-25 VITALS — BP 161/95 | HR 71 | Temp 98.7°F | Wt 250.0 lb

## 2019-10-25 DIAGNOSIS — E291 Testicular hypofunction: Secondary | ICD-10-CM | POA: Diagnosis not present

## 2019-10-25 DIAGNOSIS — C61 Malignant neoplasm of prostate: Secondary | ICD-10-CM | POA: Diagnosis not present

## 2019-10-25 LAB — MICROSCOPIC EXAMINATION
Bacteria, UA: NONE SEEN
Epithelial Cells (non renal): NONE SEEN /hpf (ref 0–10)
RBC, Urine: NONE SEEN /hpf (ref 0–2)
Renal Epithel, UA: NONE SEEN /hpf

## 2019-10-25 LAB — URINALYSIS, ROUTINE W REFLEX MICROSCOPIC
Bilirubin, UA: NEGATIVE
Glucose, UA: NEGATIVE
Ketones, UA: NEGATIVE
Leukocytes,UA: NEGATIVE
Nitrite, UA: NEGATIVE
RBC, UA: NEGATIVE
Specific Gravity, UA: 1.025 (ref 1.005–1.030)
Urobilinogen, Ur: 0.2 mg/dL (ref 0.2–1.0)
pH, UA: 6.5 (ref 5.0–7.5)

## 2019-10-25 MED ORDER — TESTOSTERONE CYPIONATE 200 MG/ML IM SOLN: 100.0000 mg | INTRAMUSCULAR | 3 refills | Status: DC

## 2019-10-25 NOTE — Addendum Note (Signed)
Addended by: Cleon Gustin on: 10/25/2019 01:56 PM   Modules accepted: Orders

## 2019-10-25 NOTE — Progress Notes (Signed)
Urological Symptom Review  Patient is experiencing the following symptoms: Pt feels as if he passed a kidney stone several months ago- burning, weak stream and frequency- resolved after 1 day   Review of Systems  Gastrointestinal (upper)  : Negative for upper GI symptoms  Gastrointestinal (lower) : Negative for lower GI symptoms  Constitutional : Negative for symptoms  Skin: Negative for skin symptoms  Eyes: Negative for eye symptoms  Ear/Nose/Throat : Negative for Ear/Nose/Throat symptoms  Hematologic/Lymphatic: Negative for Hematologic/Lymphatic symptoms  Cardiovascular : Negative for cardiovascular symptoms  Respiratory : Negative for respiratory symptoms  Endocrine: Negative for endocrine symptoms  Musculoskeletal: none  Neurological: Negative for neurological symptoms  Psychologic: Anxiety

## 2019-10-25 NOTE — Progress Notes (Signed)
10/25/2019 1:48 PM   DESTRY DAUBER 08-25-1955 [id number]  Referring provider: [facility] [address] [place],  [place] [address]  Prostate cancer and hypogonadism  HPI: [name] is a 64yo here for followup for prostate cancer and hypogonadism. PSA 4.0 which is stable.  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 He injects 100mg  q 1 week. Good energy. Good libido. Testosterone 362. hgb 13 He has intermittent urinary frequency which does not bother him. Nocturia 0-1x. Good stream.     PMH: Past Medical History:  Diagnosis Date  . Chest pain, atypical   . H/O echocardiogram 10/04/2011   EF >55%;mild concentric LVH; normals systolic & diastolic dysfunction; mild aortic stenosis  . History of nuclear stress test 10/04/2011   low risk; small amount of basal inferior bowel artifact  . Hyperlipidemia   . OSA on CPAP     Surgical History: History reviewed. No pertinent surgical history.  Home Medications:  Allergies as of 10/25/2019      Reactions   No Known Allergies       Medication List       Accurate as of October 25, 2019  1:48 PM. If you have any questions, ask your nurse or doctor.        aspirin 81 MG tablet Take 81 mg by mouth daily.   buPROPion 300 MG 24 hr tablet Commonly known as: WELLBUTRIN XL Take 1 tablet by mouth daily.   busPIRone 15 MG tablet Commonly known as: BUSPAR Take 1 tablet by mouth daily.   fish oil-omega-3 fatty acids 1000 MG capsule Take 1 g by mouth daily.   NON FORMULARY at bedtime. CPAP   rosuvastatin 20 MG tablet Commonly known as: CRESTOR Take 1 tablet (20 mg total) by mouth daily.   Saxenda 18 MG/3ML Sopn Generic drug: Liraglutide -Weight Management Inject 3 mg as directed daily.   testosterone cypionate 200 MG/ML injection Commonly known as: DEPOTESTOSTERONE CYPIONATE Inject 0.5 mLs (100 mg total) into the muscle every 7 (seven) days.       Allergies:  Allergies  Allergen Reactions  .  No Known Allergies     Family History: Family History  Problem Relation Age of Onset  . Heart attack Brother     Social History:  reports that he has quit smoking. He has never used smokeless tobacco. He reports current alcohol use. No history on file for drug use.  ROS: All other review of systems were reviewed and are negative except what is noted above in HPI  Physical Exam: BP (!) 161/95   Pulse 71   Temp 98.7 F (37.1 C)   Wt 250 lb (113.4 kg)   BMI 36.92 kg/m   Constitutional:  Alert and oriented, No acute distress. HEENT:  AT, moist mucus membranes.  Trachea midline, no masses. Cardiovascular: No clubbing, cyanosis, or edema. Respiratory: Normal respiratory effort, no increased work of breathing. GI: Abdomen is soft, nontender, nondistended, no abdominal masses GU: No CVA tenderness.  Lymph: No cervical or inguinal lymphadenopathy. Skin: No rashes, bruises or suspicious lesions. Neurologic: Grossly intact, no focal deficits, moving all 4 extremities. Psychiatric: Normal mood and affect.  Laboratory Data: Lab Results  Component Value Date   WBC 8.4 03/22/2019   HGB 15.8 03/22/2019   HCT 50.6 (H) 03/22/2019   MCV 76.6 (L) 03/22/2019   PLT 296 03/22/2019    Lab Results  Component Value Date   CREATININE 1.02 03/22/2019    Lab Results  Component Value Date   PSA 3.9 03/22/2019    Lab Results  Component Value Date   TESTOSTERONE 262 03/22/2019    No results found for: HGBA1C  Urinalysis No results found for: COLORURINE, APPEARANCEUR, LABSPEC, PHURINE, GLUCOSEU, HGBUR, BILIRUBINUR, KETONESUR, PROTEINUR, UROBILINOGEN, NITRITE, LEUKOCYTESUR  No results found for: LABMICR, [place], RBCUA, LABEPIT, MUCUS, BACTERIA  Pertinent Imaging:  No results found for this or any previous visit.  No results found for this or any previous visit.  No results found for this or any previous visit.  No results found for this or any previous visit.  Results for  orders placed during the hospital encounter of 04/24/15  US Renal  Narrative CLINICAL DATA:  Proteinuria  EXAM: RENAL / URINARY TRACT ULTRASOUND COMPLETE  COMPARISON:  None.  FINDINGS: Right Kidney:  Length: 12.7 cm. Echogenicity within normal limits. No mass or hydronephrosis visualized.  Left Kidney:  Length: 12.0 cm. Echogenicity within normal limits. No mass or hydronephrosis visualized.  Bladder:  Appears normal for degree of bladder distention.  IMPRESSION: Normal renal ultrasound.   Electronically Signed By: Franchot Gallo M.D. On: 04/24/2015 09:56  No results found for this or any previous visit.  No results found for this or any previous visit.  No results found for this or any previous visit.   Assessment & Plan:    1. Prostate cancer ([place]) -RTC 6 months with PSA  2. Hypogonadism male -continue IM testosterone 100mg  every week -RTC 6 months with labs   Return in about 6 months (around 04/26/2020) for testosterone labs and PSA.  Nicolette Bang, MD  [facility] [place]

## 2019-10-25 NOTE — Addendum Note (Signed)
Addended by: Dorisann Frames on: 10/25/2019 02:58 PM   Modules accepted: Orders

## 2019-10-25 NOTE — Patient Instructions (Signed)
Prostate-Specific Antigen Test Why am I having this test? The prostate-specific antigen (PSA) test is a screening test for prostate cancer. It can identify early signs of prostate cancer, which may allow for more effective treatment. Your health care provider may recommend that you have a PSA test starting at age 64 or that you have one earlier or later, depending on your risk factors for prostate cancer. You may also have a PSA test:  To monitor treatment of prostate cancer.  To check whether prostate cancer has returned after treatment.  If you have signs of other conditions that can affect PSA levels, such as: ? An enlarged prostate that is not caused by cancer (benign prostatic hyperplasia, BPH). This condition is very common in older men. ? A prostate infection. What is being tested? This test measures the amount of PSA in your blood. PSA is a protein that is made in the prostate. The prostate naturally produces more PSA as you age, but very high levels may be a sign of a medical condition. What kind of sample is taken?  A blood sample is required for this test. It is usually collected by inserting a needle into a blood vessel or by sticking a finger with a small needle. Blood for this test should be drawn before having an exam of the prostate. How do I prepare for this test? Do not ejaculate starting 24 hours before your test, or as long as told by your health care provider. Tell a health care provider about:  Any allergies you have.  All medicines you are taking, including vitamins, herbs, eye drops, creams, and over-the-counter medicines. This also includes: ? Medicines to assist with hair growth, such as finasteride. ? Any recent exposure to a medicine called diethylstilbestrol.  Any blood disorders you have.  Any recent procedures you have had, especially any procedures involving the prostate or rectum.  Any medical conditions you have.  Any recent urinary tract infections  (UTIs) you have had. How are the results reported? Your test results will be reported as a value that indicates how much PSA is in your blood. This will be given as nanograms of PSA per milliliter of blood (ng/mL). Your health care provider will compare your results to normal ranges that were established after testing a large group of people (reference ranges). Reference ranges may vary among labs and hospitals. PSA levels vary from person to person and generally increase with age. Because of this variation, there is no single PSA value that is considered normal for everyone. Instead, PSA reference ranges are used to describe whether your PSA levels are considered low or high (elevated). Common reference ranges are:  Low: 0-2.5 ng/mL.  Slightly to moderately elevated: 2.6-10.0 ng/mL.  Moderately elevated: 10.0-19.9 ng/mL.  Significantly elevated: 20 ng/mL or greater. Sometimes, the test results may report that a condition is present when it is not present (false-positive result). What do the results mean? A test result that is higher than 4 ng/mL may mean that you are at an increased risk for prostate cancer. However, a PSA test by itself is not enough to diagnose prostate cancer. High PSA levels may also be caused by the natural aging process, prostate infection, or BPH. PSA screening cannot tell you if your PSA is high due to cancer or a different cause. A prostate biopsy is the only way to diagnose prostate cancer. A risk of having the PSA test is diagnosing and treating prostate cancer that would never have caused any   symptoms or problems (overdiagnosis and overtreatment). Talk with your health care provider about what your results mean. Questions to ask your health care provider Ask your health care provider, or the department that is doing the test:  When will my results be ready?  How will I get my results?  What are my treatment options?  What other tests do I need?  What are my  next steps? Summary  The prostate-specific antigen (PSA) test is a screening test for prostate cancer.  Your health care provider may recommend that you have a PSA test starting at age 64 or that you have one earlier or later, depending on your risk factors for prostate cancer.  A test result that is higher than 4 ng/mL may mean that you are at an increased risk for prostate cancer. However, elevated levels can be caused by a number of conditions other than prostate cancer.  Talk with your health care provider about what your results mean. This information is not intended to replace advice given to you by your health care provider. Make sure you discuss any questions you have with your health care provider. Document Revised: 02/14/2017 Document Reviewed: 12/09/2016 Elsevier Patient Education  2020 Elsevier Inc.  

## 2019-12-23 ENCOUNTER — Ambulatory Visit: Payer: Commercial Managed Care - PPO | Attending: Internal Medicine

## 2019-12-23 DIAGNOSIS — Z23 Encounter for immunization: Secondary | ICD-10-CM

## 2019-12-23 NOTE — Progress Notes (Signed)
   Covid-19 Vaccination Clinic  Name:  Stephen Walton    MRN: [id number] DOB: 04/11/1955  12/23/2019  [name] was observed post Covid-19 immunization for 15 minutes without incident. He was provided with Vaccine Information Sheet and instruction to access the V-Safe system.   [name] was instructed to call 911 with any severe reactions post vaccine: Marland Kitchen Difficulty breathing  . Swelling of face and throat  . A fast heartbeat  . A bad rash all over body  . Dizziness and weakness

## 2020-01-25 IMAGING — MR MR PROSTATE WO/W CM
56 series · 56 of 56 positions shown · IV contrast (Multihance 19ML)
Comparison: Prostate MRI 05/17/2014

CLINICAL DATA: Prostate carcinoma.  Gleason 3+3=6.  Rising PSA.

EXAM:
MR PROSTATE WITHOUT AND WITH CONTRAST
TECHNIQUE: Multiplanar multisequence MRI images were obtained of the pelvis
centered about the prostate. Pre and post contrast images were
obtained.
CONTRAST:  20mL MULTIHANCE GADOBENATE DIMEGLUMINE 529 MG/ML IV SOLN
Creatinine was obtained on site at [HOSPITAL] at [HOSPITAL].
Results: Creatinine 1.1 mg/dL.

[Series 3: T1 · axial · 8.0mm · 1.06mm/px · 1 of 28 slices shown (1 of 2)]
[im 1/28]
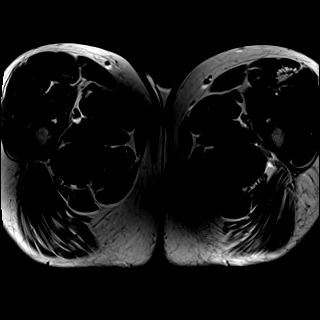

[Series 4: bSSFP fat-sat · axial · 8.0mm · 0.74mm/px · 1 of 28 slices shown]
[im 1/28]
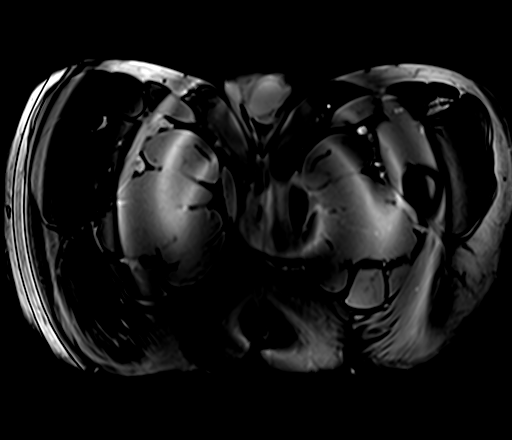

[Series 5: T2 · sagittal · 3.5mm · 0.56mm/px · 1 of 40 slices shown (1 of 5)]
[im 1/40]
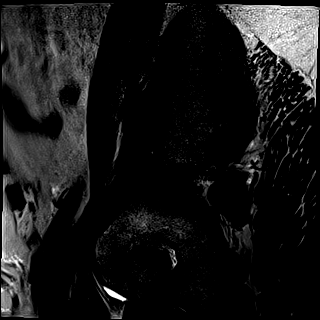

[Series 6: T2 · sagittal · 3.5mm · 0.56mm/px · 1 of 40 slices shown (2 of 5)]
[im 1/40]
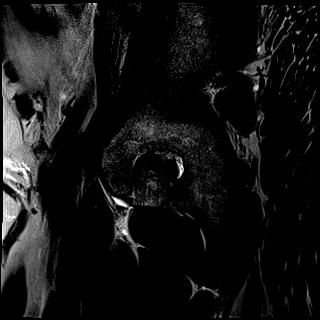

[Series 7: T1 · axial · 3.0mm · 0.31mm/px · 1 of 32 slices shown (2 of 2)]
[im 1/32]
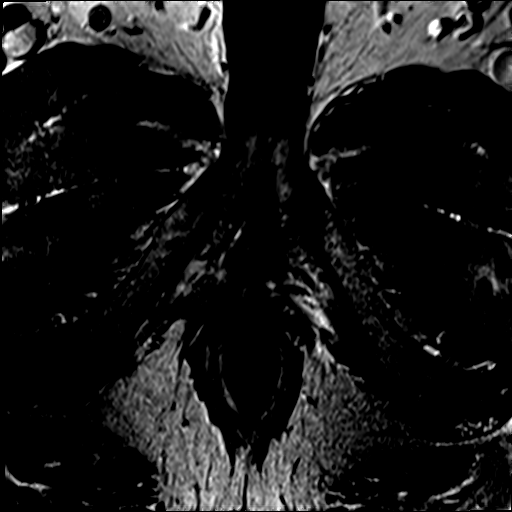

[Series 8: T2 · axial · 3.5mm · 0.56mm/px · 1 of 30 slices shown (3 of 5)]
[im 1/30]
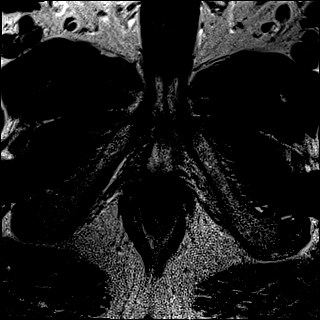

[Series 9: T2 · axial · 1.0mm · 1.04mm/px · 1 of 96 slices shown (4 of 5)]
[im 1/96]
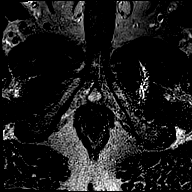

[Series 10: T2 · coronal · 3.5mm · 0.56mm/px · 1 of 38 slices shown (5 of 5)]
[im 1/38]
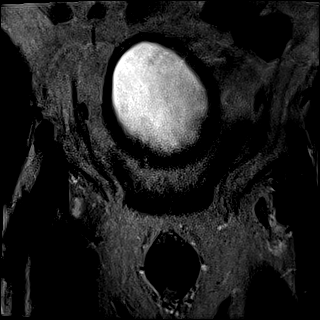

[Series 11: DWI · axial · 3.5mm · 1.56mm/px · 1 of 90 slices shown (1 of 2)]
[im 1/90]
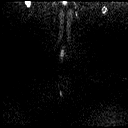

[Series 12: DWI · axial · 3.5mm · 1.56mm/px · 1 of 30 slices shown (2 of 2)]
[im 1/30]
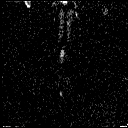

[Series 13: pre t1_twist_tra_dyn_ttc=7.5s · axial · non-contrast · 3.5mm · 0.83mm/px · 1 of 30 slices shown]
[im 1/30]
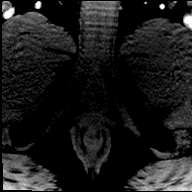

[Series 14: post t1_twist_tra_dyn-copy center · axial · 3.5mm · 0.83mm/px · 1 of 30 slices shown (1 of 23)]
[im 1/30]
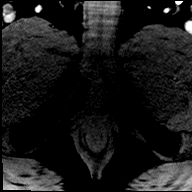

[Series 15: post t1_twist_tra_dyn-copy center · axial · 3.5mm · 0.83mm/px · 1 of 30 slices shown (2 of 23)]
[im 1/30]
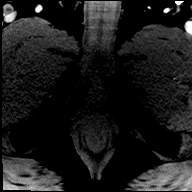

[Series 16: post t1_twist_tra_dyn-copy cent_sub_ttc=(id) · axial · 3.5mm · 0.83mm/px · 1 of 30 slices shown (1 of 22)]
[im 1/30]
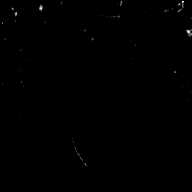

[Series 17: post t1_twist_tra_dyn-copy center · axial · 3.5mm · 0.83mm/px · 1 of 30 slices shown (3 of 23)]
[im 1/30]
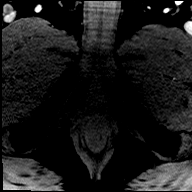

[Series 18: post t1_twist_tra_dyn-copy cent_sub_ttc=(id) · axial · 3.5mm · 0.83mm/px · 1 of 30 slices shown (2 of 22)]
[im 1/30]
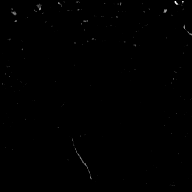

[Series 19: post t1_twist_tra_dyn-copy center · axial · 3.5mm · 0.83mm/px · 1 of 30 slices shown (4 of 23)]
[im 1/30]
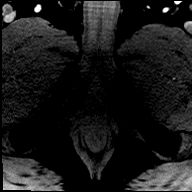

[Series 20: post t1_twist_tra_dyn-copy cent_sub_ttc=(id) · axial · 3.5mm · 0.83mm/px · 1 of 30 slices shown (3 of 22)]
[im 1/30]
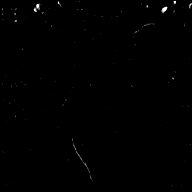

[Series 21: post t1_twist_tra_dyn-copy center · axial · 3.5mm · 0.83mm/px · 1 of 30 slices shown (5 of 23)]
[im 1/30]
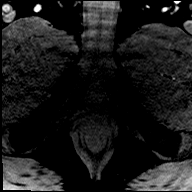

[Series 22: post t1_twist_tra_dyn-copy cent_sub_ttc=(id) · axial · 3.5mm · 0.83mm/px · 1 of 30 slices shown (4 of 22)]
[im 1/30]
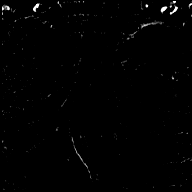

[Series 23: post t1_twist_tra_dyn-copy center · axial · 3.5mm · 0.83mm/px · 1 of 30 slices shown (6 of 23)]
[im 1/30]
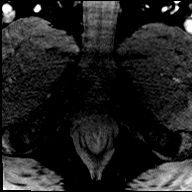

[Series 24: post t1_twist_tra_dyn-copy cent_sub_ttc=(id) · axial · 3.5mm · 0.83mm/px · 1 of 30 slices shown (5 of 22)]
[im 1/30]
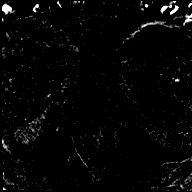

[Series 25: post t1_twist_tra_dyn-copy center · axial · 3.5mm · 0.83mm/px · 1 of 30 slices shown (7 of 23)]
[im 1/30]
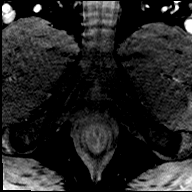

[Series 26: post t1_twist_tra_dyn-copy cent_sub_ttc=(id) · axial · 3.5mm · 0.83mm/px · 1 of 30 slices shown (6 of 22)]
[im 1/30]
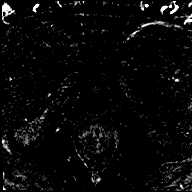

[Series 27: post t1_twist_tra_dyn-copy center · axial · 3.5mm · 0.83mm/px · 1 of 30 slices shown (8 of 23)]
[im 1/30]
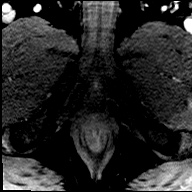

[Series 28: post t1_twist_tra_dyn-copy cent_sub_ttc=(id) · axial · 3.5mm · 0.83mm/px · 1 of 30 slices shown (7 of 22)]
[im 1/30]
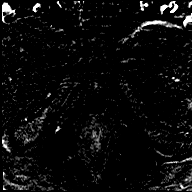

[Series 29: post t1_twist_tra_dyn-copy center · axial · 3.5mm · 0.83mm/px · 1 of 30 slices shown (9 of 23)]
[im 1/30]
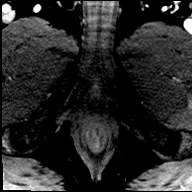

[Series 30: post t1_twist_tra_dyn-copy cent_sub_ttc=(id) · axial · 3.5mm · 0.83mm/px · 1 of 30 slices shown (8 of 22)]
[im 1/30]
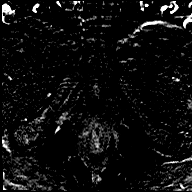

[Series 31: post t1_twist_tra_dyn-copy center · axial · 3.5mm · 0.83mm/px · 1 of 30 slices shown (10 of 23)]
[im 1/30]
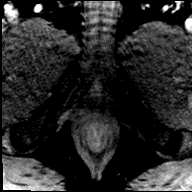

[Series 32: post t1_twist_tra_dyn-copy cent_sub_ttc=(id) · axial · 3.5mm · 0.83mm/px · 1 of 30 slices shown (9 of 22)]
[im 1/30]
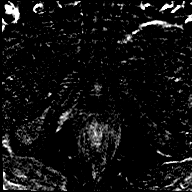

[Series 33: post t1_twist_tra_dyn-copy center · axial · 3.5mm · 0.83mm/px · 1 of 30 slices shown (11 of 23)]
[im 1/30]
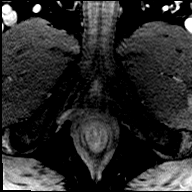

[Series 34: post t1_twist_tra_dyn-copy cent_sub_ttc=(id) · axial · 3.5mm · 0.83mm/px · 1 of 30 slices shown (10 of 22)]
[im 1/30]
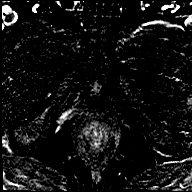

[Series 35: post t1_twist_tra_dyn-copy center · axial · 3.5mm · 0.83mm/px · 1 of 30 slices shown (12 of 23)]
[im 1/30]
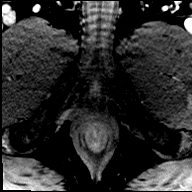

[Series 36: post t1_twist_tra_dyn-copy cent_sub_ttc=(id) · axial · 3.5mm · 0.83mm/px · 1 of 30 slices shown (11 of 22)]
[im 1/30]
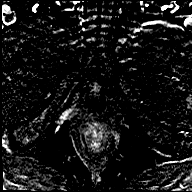

[Series 37: post t1_twist_tra_dyn-copy center · axial · 3.5mm · 0.83mm/px · 1 of 30 slices shown (13 of 23)]
[im 1/30]
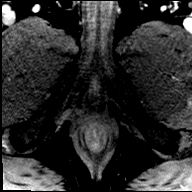

[Series 38: post t1_twist_tra_dyn-copy cent_sub_ttc=(id) · axial · 3.5mm · 0.83mm/px · 1 of 30 slices shown (12 of 22)]
[im 1/30]
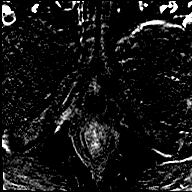

[Series 39: post t1_twist_tra_dyn-copy center · axial · 3.5mm · 0.83mm/px · 1 of 30 slices shown (14 of 23)]
[im 1/30]
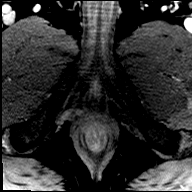

[Series 40: post t1_twist_tra_dyn-copy cent_sub_ttc=(id) · axial · 3.5mm · 0.83mm/px · 1 of 30 slices shown (13 of 22)]
[im 1/30]
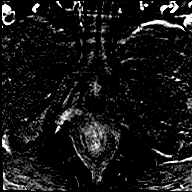

[Series 41: post t1_twist_tra_dyn-copy center · axial · 3.5mm · 0.83mm/px · 1 of 30 slices shown (15 of 23)]
[im 1/30]
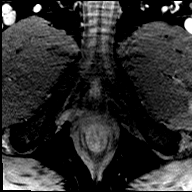

[Series 42: post t1_twist_tra_dyn-copy cent_sub_ttc=(id) · axial · 3.5mm · 0.83mm/px · 1 of 30 slices shown (14 of 22)]
[im 1/30]
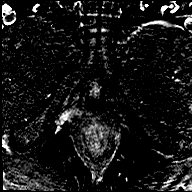

[Series 43: post t1_twist_tra_dyn-copy center · axial · 3.5mm · 0.83mm/px · 1 of 30 slices shown (16 of 23)]
[im 1/30]
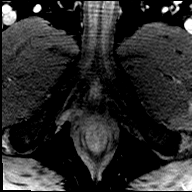

[Series 44: post t1_twist_tra_dyn-copy cent_sub_ttc=(id) · axial · 3.5mm · 0.83mm/px · 1 of 30 slices shown (15 of 22)]
[im 1/30]
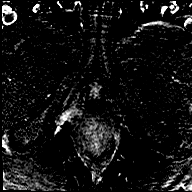

[Series 45: post t1_twist_tra_dyn-copy center · axial · 3.5mm · 0.83mm/px · 1 of 30 slices shown (17 of 23)]
[im 1/30]
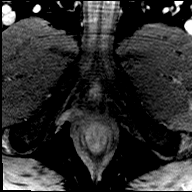

[Series 46: post t1_twist_tra_dyn-copy cent_sub_ttc=(id) · axial · 3.5mm · 0.83mm/px · 1 of 30 slices shown (16 of 22)]
[im 1/30]
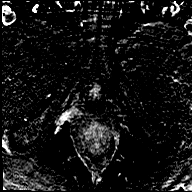

[Series 47: post t1_twist_tra_dyn-copy center · axial · 3.5mm · 0.83mm/px · 1 of 30 slices shown (18 of 23)]
[im 1/30]
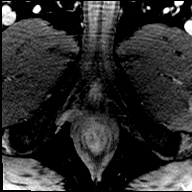

[Series 48: post t1_twist_tra_dyn-copy cent_sub_ttc=(id) · axial · 3.5mm · 0.83mm/px · 1 of 30 slices shown (17 of 22)]
[im 1/30]
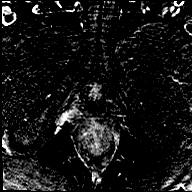

[Series 49: post t1_twist_tra_dyn-copy center · axial · 3.5mm · 0.83mm/px · 1 of 30 slices shown (19 of 23)]
[im 1/30]
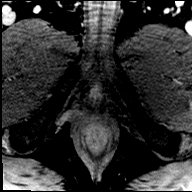

[Series 50: post t1_twist_tra_dyn-copy cent_sub_ttc=(id) · axial · 3.5mm · 0.83mm/px · 1 of 30 slices shown (18 of 22)]
[im 1/30]
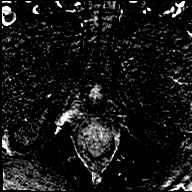

[Series 51: post t1_twist_tra_dyn-copy center · axial · 3.5mm · 0.83mm/px · 1 of 30 slices shown (20 of 23)]
[im 1/30]
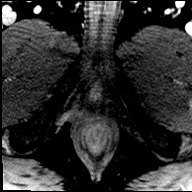

[Series 52: post t1_twist_tra_dyn-copy cent_sub_ttc=(id) · axial · 3.5mm · 0.83mm/px · 1 of 30 slices shown (19 of 22)]
[im 1/30]
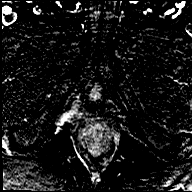

[Series 53: post t1_twist_tra_dyn-copy center · axial · 3.5mm · 0.83mm/px · 1 of 30 slices shown (21 of 23)]
[im 1/30]
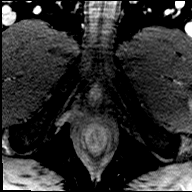

[Series 54: post t1_twist_tra_dyn-copy cent_sub_ttc=(id) · axial · 3.5mm · 0.83mm/px · 1 of 30 slices shown (20 of 22)]
[im 1/30]
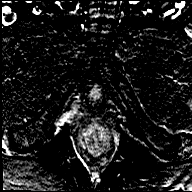

[Series 55: post t1_twist_tra_dyn-copy center · axial · 3.5mm · 0.83mm/px · 1 of 30 slices shown (22 of 23)]
[im 1/30]
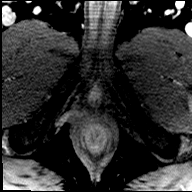

[Series 56: post t1_twist_tra_dyn-copy cent_sub_ttc=(id) · axial · 3.5mm · 0.83mm/px · 1 of 30 slices shown (21 of 22)]
[im 1/30]
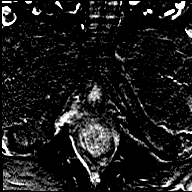

[Series 57: post t1_twist_tra_dyn-copy center · axial · 3.5mm · 0.83mm/px · 1 of 30 slices shown (23 of 23)]
[im 1/30]
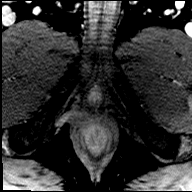

[Series 58: post t1_twist_tra_dyn-copy cent_sub_ttc=(id) · axial · 3.5mm · 0.83mm/px · 1 of 30 slices shown (22 of 22)]
[im 1/30]
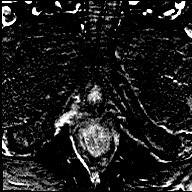

[56 of 56 positions shown; findings below may reference images not displayed]

FINDINGS: Prostate: Within the RIGHT base, small 4 mm hypointense focus on
diffusion-weighted imaging (image [DATE]) is similar to comparison
exam. Focal hypodense lesion in the peripheral zone on T2 weighted
[HOSPITAL] same locale (image [DATE]). Findings similar comparison
prostate from 05/17/2014. No abnormal enhancement through this
region.

No additional restricted diffusion within the peripheral zone.

The transitional zone is enlarged by capsulated nodules. No
suspicious lesion

Seminal vesicles are small prostatic capsule is intact

Volume: 6.0 x 4.4 by 6.7 cm (volume = 93 cm^3)

Transcapsular spread:  Absent

Seminal vesicle involvement: Absent

Neurovascular bundle involvement: Absent

Pelvic adenopathy: Absent

Bone metastasis: Absent

Other findings: None
IMPRESSION: 1. No high-grade carcinoma within the peripheral zone.
2. A tiny focus of low signal on diffusion-weighted [HOSPITAL] the
RIGHT base not changed from comparison exam in 8920 (PI-RADS: 2).
3. Nodular transitional zone consistent with benign prostate
hypertrophy.
4. Prostatomegaly.

## 2020-02-24 ENCOUNTER — Other Ambulatory Visit: Payer: Self-pay

## 2020-02-24 ENCOUNTER — Encounter: Payer: Self-pay | Admitting: Physician Assistant

## 2020-02-24 ENCOUNTER — Telehealth: Payer: Self-pay

## 2020-02-24 ENCOUNTER — Ambulatory Visit (INDEPENDENT_AMBULATORY_CARE_PROVIDER_SITE_OTHER): Payer: Commercial Managed Care - PPO | Admitting: Physician Assistant

## 2020-02-24 VITALS — BP 126/84 | HR 77 | Ht 69.0 in | Wt 246.2 lb

## 2020-02-24 DIAGNOSIS — G4733 Obstructive sleep apnea (adult) (pediatric): Secondary | ICD-10-CM

## 2020-02-24 DIAGNOSIS — E785 Hyperlipidemia, unspecified: Secondary | ICD-10-CM

## 2020-02-24 DIAGNOSIS — Z9989 Dependence on other enabling machines and devices: Secondary | ICD-10-CM

## 2020-02-24 DIAGNOSIS — Z8249 Family history of ischemic heart disease and other diseases of the circulatory system: Secondary | ICD-10-CM

## 2020-02-24 MED ORDER — ROSUVASTATIN CALCIUM 20 MG PO TABS: 20.0000 mg | ORAL_TABLET | Freq: Every day | ORAL | 3 refills | Status: DC

## 2020-02-24 NOTE — Progress Notes (Signed)
Cardiology Office Note:    Date:  02/26/2020   ID:  Stephen Walton, DOB 1955-12-14, MRN [id number]  PCP:  [place], [place] Cardiologist:  Shelva Majestic, MD  [place] Electrophysiologist:  None   Referring MD: [facility]   Chief Complaint  Patient presents with  . Follow-up    Seen for Dr. Claiborne Billings.    History of Present Illness:    Stephen Walton is a 64 y.o. male with a hx of OSA on CPAP, obesity, hyperlipidemia, and history of chest pain with negative work-up.  Patient previously had chest pain shortly after his brother had cardiac arrest.  Echocardiogram obtained at the time showed moderate LVH with normal systolic and diastolic function.  Myoview obtained in July 2013 showed normal perfusion.  Patient presents today for follow-up.  He had lab work obtained in August which showed a stable renal function and electrolyte.  He finally retired to this year however continue to stay active.  He denies any chest pain or shortness of breath.  He has no lower extremity edema, orthopnea or PND.  He is still very concerned of his significant family history of early CAD with his brother having a cardiac arrest.  He is aware to inform cardiology service if he start to have any exertional chest pain or shortness of breath.   Past Medical History:  Diagnosis Date  . Chest pain, atypical   . H/O echocardiogram 10/04/2011   EF >55%;mild concentric LVH; normals systolic & diastolic dysfunction; mild aortic stenosis  . History of nuclear stress test 10/04/2011   low risk; small amount of basal inferior bowel artifact  . Hyperlipidemia   . OSA on CPAP     History reviewed. No pertinent surgical history.  Current Medications: Current Meds  Medication Sig  . aspirin 81 MG tablet Take 81 mg by mouth daily.  Marland Kitchen buPROPion (WELLBUTRIN XL) 300 MG 24 hr tablet Take 1 tablet by mouth daily.  . busPIRone (BUSPAR) 15 MG tablet Take 1 tablet by mouth  daily.  . fish oil-omega-3 fatty acids 1000 MG capsule Take 1 g by mouth daily.  . Liraglutide -Weight Management (SAXENDA) 18 MG/3ML SOPN Inject 3 mg as directed daily.  . NON FORMULARY at bedtime. CPAP  . testosterone cypionate (DEPOTESTOSTERONE CYPIONATE) 200 MG/ML injection Inject 0.5 mLs (100 mg total) into the muscle every 7 (seven) days.  . [DISCONTINUED] rosuvastatin (CRESTOR) 20 MG tablet Take 1 tablet (20 mg total) by mouth daily.     Allergies:   No known allergies   Social History   Socioeconomic History  . Marital status: Married    Spouse name: Not on file  . Number of children: 4  . Years of education: Not on file  . Highest education level: Not on file  Occupational History  . 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 Smokeless tobacco: Never Used  . Tobacco comment: quit 30 years ago.  Substance and Sexual Activity  . Alcohol use: Yes    Comment: 1-2 beers per month  . Drug use: Not on file  . Sexual activity: Not on file  Other Topics Concern  . Not on file  Social History Narrative  . Not on file   Social Determinants of Health   Financial Resource Strain: Not on file  Food Insecurity: Not on file  Transportation Needs: Not on file  Physical Activity: Not on file  Stress: Not on file  Social Connections: Not on file     Family History: The patient's family history includes Heart attack in his brother.  ROS:   Please see the history of present illness.     All other systems reviewed and are negative.  EKGs/Labs/Other Studies Reviewed:    The following studies were reviewed today:  N/A  EKG:  EKG is ordered today.  The ekg ordered today demonstrates normal sinus rhythm, no significant ST-T wave changes.  Recent Labs: 10/21/2019: ALT 18; BUN 23; Creat 1.24; Hemoglobin 13.3; Platelets 350; Potassium 4.8; Sodium 139  Recent Lipid Panel No results found for: CHOL, TRIG, HDL, CHOLHDL,  VLDL, LDLCALC, LDLDIRECT   Risk Assessment/Calculations:       Physical Exam:    VS:  BP 126/84   Pulse 77   Ht 5\' 9"  (1.753 m)   Wt 246 lb 3.2 oz (111.7 kg)   SpO2 97%   BMI 36.36 kg/m     Wt Readings from Last 3 Encounters:  02/24/20 246 lb 3.2 oz (111.7 kg)  10/25/19 250 lb (113.4 kg)  03/30/19 243 lb (110.2 kg)     GEN:  Well nourished, well developed in no acute distress HEENT: Normal NECK: No JVD; No carotid bruits LYMPHATICS: No lymphadenopathy CARDIAC: RRR, no murmurs, rubs, gallops RESPIRATORY:  Clear to auscultation without rales, wheezing or rhonchi  ABDOMEN: Soft, non-tender, non-distended MUSCULOSKELETAL:  No edema; No deformity  SKIN: Warm and dry NEUROLOGIC:  Alert and oriented x 3 PSYCHIATRIC:  Normal affect   ASSESSMENT:    1. Family history of early CAD   2. Hyperlipidemia, unspecified hyperlipidemia type   3. OSA on CPAP    PLAN:    In order of problems listed above:  1. Family history of CAD: Patient does not have any recent exertional chest pain or shortness of breath.  Previous echocardiogram and a Myoview were normal.  He does not have any prior history of CAD.  2. Hyperlipidemia: Continue Crestor  3. Obstructive sleep apnea on CPAP therapy: He is 100% compliant with CPAP therapy.  Since retirement, he is getting at least 7 hours per night of the CPAP.    Shared Decision Making/Informed Consent        Medication Adjustments/Labs and Tests Ordered: Current medicines are reviewed at length with the patient today.  Concerns regarding medicines are outlined above.  Orders Placed This Encounter  Procedures  . Lipid panel  . EKG 12-Lead   Meds ordered this encounter  Medications  . rosuvastatin (CRESTOR) 20 MG tablet    Sig: Take 1 tablet (20 mg total) by mouth daily.    Dispense:  90 tablet    Refill:  3    Patient Instructions  Medication Instructions:  Your physician recommends that you continue on your current medications  as directed. Please refer to the Current Medication list given to you today.  *If you need a refill on your cardiac medications before your next appointment, please call your pharmacy*  Lab Work: Your physician recommends that you return for lab work in 2-3 MONTHS:   Fasting Lipid Panel-DO NOT EAT OR DRINK PAST MIDNIGHT. OKAY TO HAVE WATER If you have labs (blood work) drawn today and your tests are completely normal, you will receive your results only by: Marland Kitchen MyChart Message (if you have MyChart) OR . A paper copy in the mail If you have any lab test that is abnormal or we  need to change your treatment, we will call you to review the results.  Testing/Procedures: NONE ordered at this time of appointment   Follow-Up: At [facility], you and your health needs are our priority.  As part of our continuing mission to provide you with exceptional heart care, we have created designated Provider Care Teams.  These Care Teams include your primary Cardiologist (physician) and Advanced Practice Providers (APPs -  Physician Assistants and Nurse Practitioners) who all work together to provide you with the care you need, when you need it.   Your next appointment:   9 month(s)  The format for your next appointment:   In Person  Provider:   Shelva Majestic, MD  Other Instructions      Signed, Stephen Walton, [place]  02/26/2020 1:06 AM    [place]

## 2020-02-24 NOTE — Patient Instructions (Signed)
Medication Instructions:  Your physician recommends that you continue on your current medications as directed. Please refer to the Current Medication list given to you today.  *If you need a refill on your cardiac medications before your next appointment, please call your pharmacy*  Lab Work: Your physician recommends that you return for lab work in 2-3 MONTHS:   Fasting Lipid Panel-DO NOT EAT OR DRINK PAST MIDNIGHT. OKAY TO HAVE WATER If you have labs (blood work) drawn today and your tests are completely normal, you will receive your results only by: Marland Kitchen MyChart Message (if you have MyChart) OR . A paper copy in the mail If you have any lab test that is abnormal or we need to change your treatment, we will call you to review the results.  Testing/Procedures: NONE ordered at this time of appointment   Follow-Up: At [facility], you and your health needs are our priority.  As part of our continuing mission to provide you with exceptional heart care, we have created designated Provider Care Teams.  These Care Teams include your primary Cardiologist (physician) and Advanced Practice Providers (APPs -  Physician Assistants and Nurse Practitioners) who all work together to provide you with the care you need, when you need it.   Your next appointment:   9 month(s)  The format for your next appointment:   In Person  Provider:   Shelva Majestic, MD  Other Instructions

## 2020-02-24 NOTE — Telephone Encounter (Signed)
Patient had a office visit with Almyra Deforest, PA-C and brought in a letter from [facility] patient that his prescription for his CPAP supplies that is on file has expired and a need for updated prescription from physician is needed to [facility]. I gave letter to Patria Mane, RN to assist with this.

## 2020-02-25 NOTE — Telephone Encounter (Signed)
Faxed order to [facility]

## 2020-02-26 ENCOUNTER — Encounter: Payer: Self-pay | Admitting: Physician Assistant

## 2020-04-19 ENCOUNTER — Other Ambulatory Visit: Payer: Medicare Other

## 2020-04-19 ENCOUNTER — Other Ambulatory Visit: Payer: Self-pay

## 2020-04-19 DIAGNOSIS — C61 Malignant neoplasm of prostate: Secondary | ICD-10-CM

## 2020-04-19 DIAGNOSIS — E291 Testicular hypofunction: Secondary | ICD-10-CM

## 2020-04-20 LAB — CBC WITH DIFFERENTIAL/PLATELET
Basophils Absolute: 0.1 10*3/uL (ref 0.0–0.2)
Basos: 2 %
EOS (ABSOLUTE): 0.6 10*3/uL — ABNORMAL HIGH (ref 0.0–0.4)
Eos: 7 %
Hematocrit: 48.4 % (ref 37.5–51.0)
Hemoglobin: 14.5 g/dL (ref 13.0–17.7)
Immature Grans (Abs): 0 10*3/uL (ref 0.0–0.1)
Immature Granulocytes: 0 %
Lymphocytes Absolute: 2 10*3/uL (ref 0.7–3.1)
Lymphs: 24 %
MCH: 22.1 pg — ABNORMAL LOW (ref 26.6–33.0)
MCHC: 30 g/dL — ABNORMAL LOW (ref 31.5–35.7)
MCV: 74 fL — ABNORMAL LOW (ref 79–97)
Monocytes Absolute: 1 10*3/uL — ABNORMAL HIGH (ref 0.1–0.9)
Monocytes: 12 %
Neutrophils Absolute: 4.6 10*3/uL (ref 1.4–7.0)
Neutrophils: 55 %
Platelets: 323 10*3/uL (ref 150–450)
RBC: 6.57 x10E6/uL — ABNORMAL HIGH (ref 4.14–5.80)
RDW: 19.7 % — ABNORMAL HIGH (ref 11.6–15.4)
WBC: 8.3 10*3/uL (ref 3.4–10.8)

## 2020-04-20 LAB — COMPREHENSIVE METABOLIC PANEL
ALT: 19 IU/L (ref 0–44)
AST: 20 IU/L (ref 0–40)
Albumin/Globulin Ratio: 1.8 (ref 1.2–2.2)
Albumin: 4.2 g/dL (ref 3.8–4.8)
Alkaline Phosphatase: 82 IU/L (ref 44–121)
BUN/Creatinine Ratio: 11 (ref 10–24)
BUN: 12 mg/dL (ref 8–27)
Bilirubin Total: 0.5 mg/dL (ref 0.0–1.2)
CO2: 21 mmol/L (ref 20–29)
Calcium: 9.3 mg/dL (ref 8.6–10.2)
Chloride: 101 mmol/L (ref 96–106)
Creatinine, Ser: 1.09 mg/dL (ref 0.76–1.27)
GFR calc Af Amer: 82 mL/min/{1.73_m2} (ref >59)
GFR calc non Af Amer: 71 mL/min/{1.73_m2} (ref >59)
Globulin, Total: 2.4 g/dL (ref 1.5–4.5)
Glucose: 132 mg/dL — ABNORMAL HIGH (ref 65–99)
Potassium: 4.7 mmol/L (ref 3.5–5.2)
Sodium: 138 mmol/L (ref 134–144)
Total Protein: 6.6 g/dL (ref 6.0–8.5)

## 2020-04-20 LAB — TESTOSTERONE,FREE AND TOTAL
Testosterone, Free: 12.8 pg/mL (ref 6.6–18.1)
Testosterone: 673 ng/dL (ref 264–916)

## 2020-04-20 LAB — PSA: Prostate Specific Ag, Serum: 4.6 ng/mL — ABNORMAL HIGH (ref 0.0–4.0)

## 2020-04-26 ENCOUNTER — Encounter: Payer: Self-pay | Admitting: Urology

## 2020-04-26 ENCOUNTER — Ambulatory Visit: Payer: 59 | Admitting: Urology

## 2020-04-26 ENCOUNTER — Ambulatory Visit (INDEPENDENT_AMBULATORY_CARE_PROVIDER_SITE_OTHER): Payer: Medicare Other | Admitting: Urology

## 2020-04-26 ENCOUNTER — Other Ambulatory Visit: Payer: Self-pay

## 2020-04-26 ENCOUNTER — Other Ambulatory Visit: Payer: Self-pay | Admitting: Urology

## 2020-04-26 VITALS — BP 131/85 | HR 93 | Temp 98.7°F | Wt 245.0 lb

## 2020-04-26 DIAGNOSIS — E291 Testicular hypofunction: Secondary | ICD-10-CM | POA: Diagnosis not present

## 2020-04-26 DIAGNOSIS — N401 Enlarged prostate with lower urinary tract symptoms: Secondary | ICD-10-CM | POA: Diagnosis not present

## 2020-04-26 DIAGNOSIS — C61 Malignant neoplasm of prostate: Secondary | ICD-10-CM

## 2020-04-26 DIAGNOSIS — N138 Other obstructive and reflux uropathy: Secondary | ICD-10-CM

## 2020-04-26 DIAGNOSIS — R351 Nocturia: Secondary | ICD-10-CM | POA: Insufficient documentation

## 2020-04-26 DIAGNOSIS — N400 Enlarged prostate without lower urinary tract symptoms: Secondary | ICD-10-CM | POA: Insufficient documentation

## 2020-04-26 LAB — URINALYSIS, ROUTINE W REFLEX MICROSCOPIC
Bilirubin, UA: NEGATIVE
Glucose, UA: NEGATIVE
Ketones, UA: NEGATIVE
Leukocytes,UA: NEGATIVE
Nitrite, UA: NEGATIVE
Protein,UA: NEGATIVE
RBC, UA: NEGATIVE
Specific Gravity, UA: <1.005 — ABNORMAL LOW (ref 1.005–1.030)
Urobilinogen, Ur: 0.2 mg/dL (ref 0.2–1.0)
pH, UA: 5.5 (ref 5.0–7.5)

## 2020-04-26 MED ORDER — LEVOFLOXACIN 750 MG PO TABS: 750.0000 mg | ORAL_TABLET | Freq: Once | ORAL | 0 refills | Status: AC

## 2020-04-26 MED ORDER — ALFUZOSIN HCL ER 10 MG PO TB24: 10.0000 mg | ORAL_TABLET | Freq: Every day | ORAL | 11 refills | Status: DC

## 2020-04-26 MED ORDER — TESTOSTERONE CYPIONATE 200 MG/ML IM SOLN: 100.0000 mg | INTRAMUSCULAR | 3 refills | Status: DC

## 2020-04-26 NOTE — Progress Notes (Signed)
Urological Symptom Review  Patient is experiencing the following symptoms: Frequent urination Get up at night to urinate Stream starts and stops Blood in urine   Review of Systems  Gastrointestinal (upper)  : Negative for upper GI symptoms  Gastrointestinal (lower) : Negative for lower GI symptoms  Constitutional : Negative for symptoms  Skin: Negative for skin symptoms  Eyes: Negative for eye symptoms  Ear/Nose/Throat : Negative for Ear/Nose/Throat symptoms  Hematologic/Lymphatic: Negative for Hematologic/Lymphatic symptoms  Cardiovascular : Negative for cardiovascular symptoms  Respiratory : Negative for respiratory symptoms  Endocrine: Negative for endocrine symptoms  Musculoskeletal: Negative for musculoskeletal symptoms  Neurological: Negative for neurological symptoms  Psychologic: Anxiety

## 2020-04-26 NOTE — Patient Instructions (Addendum)
[facility] .urology (11th ed., pp. [phone]). [place], [place]: Elsevier.">  Transrectal Ultrasound-Guided Prostate Biopsy This is a procedure to take samples of tissue from your prostate. Ultrasound images are used to guide the procedure. It is usually done to check for prostate cancer. What happens before the procedure? Eating and drinking restrictions Follow instructions from your doctor about what you can eat or drink. Medicines  Ask your doctor about changing or stopping: ? Your normal medicines. ? Vitamins, herbs, and supplements. ? Over-the-counter medicines.  Do not take aspirin or ibuprofen unless you are told to. General instructions  Liquid will be used to clear waste from your butt (enema).  You may have a blood sample taken.  You may have a pee (urine) sample taken.  Plan to have a responsible adult take you home from the hospital or clinic.  If you will be going home right after the procedure, plan to have a responsible adult care for you for the time you are told. This is important.  For your safety, your doctor may: ? Ask you to wash with a soap that kills germs. ? Give you antibiotic medicine. What happens during the procedure?  You will be given one or both of these: ? A medicine to help you relax. ? A medicine to numb the area.  You will be placed on your left side. Your knees may be bent.  A probe with gel on it will be placed in your butt. Pictures will be taken of your prostate and the area around it.  Medicine will be used to numb your prostate.  A needle will be placed in your butt and moved to your prostate.  Prostate tissue will be removed.  The samples will be sent to a lab. The procedure may vary.   What happens after the procedure?  You will be watched until the medicines you were given have worn off.  You may have some pain in your butt. You will be given medicine for it.  Do not drive for 24 hours if you were given a medicine  to help you relax. Summary  This procedure is usually done to check for prostate cancer.  Before the procedure, ask your doctor about changing or stopping your medicines.  You may have some pain in your butt. You will be given medicine for it.  Plan to have a responsible adult take you home from the hospital or clinic. This information is not intended to replace advice given to you by your health care provider. Make sure you discuss any questions you have with your health care provider. Document Revised: 12/17/2019 Document Reviewed: 11/19/2019 Elsevier Patient Education  [place].    Appointment Time: Appointment Date:  Location: [facility] Department   Prostate Biopsy Instructions  Stop all aspirin or blood thinners (aspirin, plavix, coumadin, warfarin, motrin, ibuprofen, advil, aleve, naproxen, naprosyn) for 7 days prior to the procedure.  If you have any questions about stopping these medications, please contact your primary care physician or cardiologist.  Having a light meal prior to the procedure is recommended.  If you are diabetic or have low blood sugar please bring a small snack or glucose tablet.  A Fleets enema is needed to be purchased over the counter at a local pharmacy and used 2 hours before you scheduled appointment.  This can be purchased over the counter at any pharmacy.  Antibiotics will be administered in the clinic at the time of the procedure and 1 tablet has  been sent to your pharmacy. Please take the antibiotic as prescribed.    Please bring someone with you to the procedure to drive you home if you are given a valium to take prior to your procedure.   If you have any questions or concerns, please feel free to call the office at [phone] or send a Mychart message.    Thank you, [facility]

## 2020-04-26 NOTE — Progress Notes (Signed)
04/26/2020 10:56 AM   Laure Kidney [birth year] [id number]  Referring provider: [facility] [address],  [place] [address]  Followup prostate cancer and BPH  HPI: [name] is a 65yo here for followup for prostate cancer. His PSA increased to 4.6 from 4.0. He has not had a biopsy since 2016. MRI in 2019 showed no concerning lesions. Since last visit he noted worsening LUTS. IPSS 17. He is on TRT. Testosterone 673, hgb 14.5, CMP normal.    PMH: Past Medical History:  Diagnosis Date  . Chest pain, atypical   . H/O echocardiogram 10/04/2011   EF >55%;mild concentric LVH; normals systolic & diastolic dysfunction; mild aortic stenosis  . History of nuclear stress test 10/04/2011   low risk; small amount of basal inferior bowel artifact  . Hyperlipidemia   . OSA on CPAP     Surgical History: History reviewed. No pertinent surgical history.  Home Medications:  Allergies as of 04/26/2020      Reactions   No Known Allergies       Medication List       Accurate as of April 26, 2020 10:56 AM. If you have any questions, ask your nurse or doctor.        aspirin 81 MG tablet Take 81 mg by mouth daily.   buPROPion 300 MG 24 hr tablet Commonly known as: WELLBUTRIN XL Take 1 tablet by mouth daily.   busPIRone 15 MG tablet Commonly known as: BUSPAR Take 1 tablet by mouth daily.   fish oil-omega-3 fatty acids 1000 MG capsule Take 1 g by mouth daily.   NON FORMULARY at bedtime. CPAP   rosuvastatin 20 MG tablet Commonly known as: CRESTOR Take 1 tablet (20 mg total) by mouth daily.   Saxenda 18 MG/3ML Sopn Generic drug: Liraglutide -Weight Management Inject 3 mg as directed daily.   testosterone cypionate 200 MG/ML injection Commonly known as: DEPOTESTOSTERONE CYPIONATE Inject 0.5 mLs (100 mg total) into the muscle every 7 (seven) days.       Allergies:  Allergies  Allergen Reactions  . No Known Allergies     Family  History: Family History  Problem Relation Age of Onset  . Heart attack Brother     Social History:  reports that he has quit smoking. He has never used smokeless tobacco. He reports current alcohol use. No history on file for drug use.  ROS: All other review of systems were reviewed and are negative except what is noted above in HPI  Physical Exam: BP 131/85   Pulse 93   Temp 98.7 F (37.1 C)   Wt 245 lb (111.1 kg)   BMI 36.18 kg/m   Constitutional:  Alert and oriented, No acute distress. HEENT: [place] AT, moist mucus membranes.  Trachea midline, no masses. Cardiovascular: No clubbing, cyanosis, or edema. Respiratory: Normal respiratory effort, no increased work of breathing. GI: Abdomen is soft, nontender, nondistended, no abdominal masses GU: No CVA tenderness.  Lymph: No cervical or inguinal lymphadenopathy. Skin: No rashes, bruises or suspicious lesions. Neurologic: Grossly intact, no focal deficits, moving all 4 extremities. Psychiatric: Normal mood and affect.  Laboratory Data: Lab Results  Component Value Date   WBC 8.3 04/19/2020   HGB 14.5 04/19/2020   HCT 48.4 04/19/2020   MCV 74 (L) 04/19/2020   PLT 323 04/19/2020    Lab Results  Component Value Date   CREATININE 1.09 04/19/2020    Lab Results  Component Value Date  PSA 4.0 10/21/2019   PSA 3.9 03/22/2019    Lab Results  Component Value Date   TESTOSTERONE 673 04/19/2020    No results found for: HGBA1C  Urinalysis    Component Value Date/Time   APPEARANCEUR Clear 10/25/2019 1459   GLUCOSEU Negative 10/25/2019 1459   BILIRUBINUR Negative 10/25/2019 1459   PROTEINUR 2+ (A) 10/25/2019 1459   NITRITE Negative 10/25/2019 1459   LEUKOCYTESUR Negative 10/25/2019 1459    Lab Results  Component Value Date   LABMICR See below: 10/25/2019   WBCUA 0-5 10/25/2019   LABEPIT None seen 10/25/2019   BACTERIA None seen 10/25/2019    Pertinent Imaging:  No results found for this or any previous  visit.  No results found for this or any previous visit.  No results found for this or any previous visit.  No results found for this or any previous visit.  Results for orders placed during the hospital encounter of 04/24/15  US Renal  Narrative CLINICAL DATA:  Proteinuria  EXAM: RENAL / URINARY TRACT ULTRASOUND COMPLETE  COMPARISON:  None.  FINDINGS: Right Kidney:  Length: 12.7 cm. Echogenicity within normal limits. No mass or hydronephrosis visualized.  Left Kidney:  Length: 12.0 cm. Echogenicity within normal limits. No mass or hydronephrosis visualized.  Bladder:  Appears normal for degree of bladder distention.  IMPRESSION: Normal renal ultrasound.   Electronically Signed By: Franchot Gallo M.D. On: 04/24/2015 09:56  No results found for this or any previous visit.  No results found for this or any previous visit.  No results found for this or any previous visit.   Assessment & Plan:    1. Prostate cancer ([place]) -schedule for prostate biopsy. The patient and I talked about etiologies of elevated PSA.  We discussed the possible relationship between elevated PSA, prostate cancer, BPH, prostatitis, and UTI.   Conservative treatment of elevated PSA with watchful waiting was discussed with the patient.  All questions were answered.        All of the risks and benefits along with alternatives to prostate biopsy were discussed with the patient.  The patient gave fully informed consent to proceed with a transrectal ultrasound guided biopsy of the prostate for the evaluation of their evated PSA.  Prostate biopsy instructions and antibiotics were given to the patient.  - Urinalysis, Routine w reflex microscopic  2. BPH with LUTS, Nocturia -start uroxatral 10mg  qhs  Return in about 4 weeks (around 05/24/2020) for prostate biopsy.  Nicolette Bang, MD  [facility] [place]

## 2020-05-01 ENCOUNTER — Other Ambulatory Visit: Payer: Medicare Other | Admitting: Urology

## 2020-05-05 ENCOUNTER — Other Ambulatory Visit: Payer: Self-pay

## 2020-05-05 DIAGNOSIS — E785 Hyperlipidemia, unspecified: Secondary | ICD-10-CM

## 2020-05-06 LAB — LIPID PANEL
Chol/HDL Ratio: 2.6 ratio (ref 0.0–5.0)
Cholesterol, Total: 110 mg/dL (ref 100–199)
HDL: 43 mg/dL (ref >39)
LDL Chol Calc (NIH): 51 mg/dL (ref 0–99)
Triglycerides: 77 mg/dL (ref 0–149)
VLDL Cholesterol Cal: 16 mg/dL (ref 5–40)

## 2020-05-08 NOTE — Progress Notes (Signed)
Cholesterol is excellent. Continue current therapy

## 2020-05-17 ENCOUNTER — Other Ambulatory Visit: Payer: Medicare Other | Admitting: Urology

## 2020-06-12 ENCOUNTER — Other Ambulatory Visit: Payer: Medicare Other | Admitting: Urology

## 2020-06-21 ENCOUNTER — Other Ambulatory Visit: Payer: Self-pay | Admitting: Urology

## 2020-06-21 ENCOUNTER — Other Ambulatory Visit: Payer: Medicare Other | Admitting: Urology

## 2020-06-21 ENCOUNTER — Ambulatory Visit (HOSPITAL_COMMUNITY)
Admission: RE | Admit: 2020-06-21 | Discharge: 2020-06-21 | Disposition: A | Discharge status: Home / Self Care | Payer: Medicare Other | Source: Ambulatory Visit | Attending: Urology | Admitting: Urology

## 2020-06-21 ENCOUNTER — Encounter (HOSPITAL_COMMUNITY): Payer: Self-pay

## 2020-06-21 ENCOUNTER — Other Ambulatory Visit: Payer: Self-pay

## 2020-06-21 DIAGNOSIS — D291 Benign neoplasm of prostate: Secondary | ICD-10-CM | POA: Diagnosis not present

## 2020-06-21 DIAGNOSIS — C61 Malignant neoplasm of prostate: Secondary | ICD-10-CM | POA: Diagnosis present

## 2020-06-21 DIAGNOSIS — R972 Elevated prostate specific antigen [PSA]: Secondary | ICD-10-CM | POA: Diagnosis present

## 2020-06-21 MED ORDER — GENTAMICIN SULFATE 40 MG/ML IJ SOLN
INTRAMUSCULAR | Status: AC
  Administered 2020-06-21: 80 mg via INTRAMUSCULAR
  Filled 2020-06-21: qty 2

## 2020-06-21 MED ORDER — LIDOCAINE HCL (PF) 2 % IJ SOLN
INTRAMUSCULAR | Status: AC
  Administered 2020-06-21: 10 mL
  Filled 2020-06-21: qty 10

## 2020-06-21 MED ORDER — GENTAMICIN SULFATE 40 MG/ML IJ SOLN: 80.0000 mg | Freq: Once | INTRAMUSCULAR | Status: AC

## 2020-06-21 MED ORDER — LIDOCAINE HCL (PF) 2 % IJ SOLN: 10.0000 mL | Freq: Once | INTRAMUSCULAR | Status: AC

## 2020-06-21 NOTE — Discharge Instructions (Signed)

## 2020-06-21 NOTE — Sedation Documentation (Signed)
PT tolerated prostate biopsy procedure and antibiotic injections well today. Labs obtained and sent for pathology. PT ambulatory at discharge with no acute distress noted and verbalized understanding of discharge instructions.  

## 2020-06-21 NOTE — Progress Notes (Deleted)
Prostate Biopsy Procedure   Informed consent was obtained after discussing risks/benefits of the procedure.  A time out was performed to ensure correct patient identity.  Pre-Procedure: - Last PSA Level:  Lab Results  Component Value Date   PSA 4.0 10/21/2019   PSA 3.9 03/22/2019   - Gentamicin given prophylactically - Levaquin 500 mg administered PO -Transrectal Ultrasound performed revealing a *** gm prostate -No significant hypoechoic or median lobe noted  Procedure: - Prostate block performed using 10 cc 1% lidocaine and biopsies taken from sextant areas, a total of 12 under ultrasound guidance.  Post-Procedure: - Patient tolerated the procedure well - He was counseled to seek immediate medical attention if experiences any severe pain, significant bleeding, or fevers - Return in one week to discuss biopsy results

## 2020-07-04 ENCOUNTER — Telehealth: Payer: Self-pay

## 2020-07-04 NOTE — Telephone Encounter (Signed)
-----   Message from Cleon Gustin, MD sent at 07/04/2020  9:09 AM EDT ----- Negative. Have him see me in 6 months with PSA ----- Message ----- From: Valentina Lucks, LPN Sent: 07/12/8339   2:04 PM EDT To: Cleon Gustin, MD  Pls review.

## 2020-07-04 NOTE — Telephone Encounter (Signed)
Pt called and notified of pathology results and new appointments scheduled.

## 2020-07-05 ENCOUNTER — Ambulatory Visit: Payer: Medicare Other | Admitting: Urology

## 2020-11-07 ENCOUNTER — Encounter: Payer: Self-pay | Admitting: General Surgery

## 2020-11-07 ENCOUNTER — Other Ambulatory Visit: Payer: Self-pay

## 2020-11-07 ENCOUNTER — Ambulatory Visit (INDEPENDENT_AMBULATORY_CARE_PROVIDER_SITE_OTHER): Payer: Medicare Other | Admitting: General Surgery

## 2020-11-07 VITALS — BP 151/88 | HR 75 | Temp 98.7°F | Resp 14 | Ht 69.0 in | Wt 249.0 lb

## 2020-11-07 DIAGNOSIS — Z1211 Encounter for screening for malignant neoplasm of colon: Secondary | ICD-10-CM

## 2020-11-07 MED ORDER — SUTAB 1479-225-188 MG PO TABS: 1.0000 | ORAL_TABLET | Freq: Once | ORAL | 0 refills | Status: AC

## 2020-11-08 NOTE — H&P (Signed)
Stephen Walton; [id number]; Apr 18, 1955   HPI Patient is a 65 year old white male who was referred to my care by Collene Mares for a screening colonoscopy.  Patient denies any abdominal pain, blood per in his stools, significant weight change, diarrhea, or constipation of significance.  He last had a colonoscopy over 10 years ago. Past Medical History:  Diagnosis Date   Chest pain, atypical    H/O echocardiogram 10/04/2011   EF >55%;mild concentric LVH; normals systolic & diastolic dysfunction; mild aortic stenosis   History of nuclear stress test 10/04/2011   low risk; small amount of basal inferior bowel artifact   Hyperlipidemia    OSA on CPAP     History reviewed. No pertinent surgical history.  Family History  Problem Relation Age of Onset   Heart attack Brother     Current Outpatient Medications on File Prior to Visit  Medication Sig Dispense Refill   alfuzosin (UROXATRAL) 10 MG 24 hr tablet Take 1 tablet (10 mg total) by mouth at bedtime. 30 tablet 11   aspirin 81 MG tablet Take 81 mg by mouth daily.     buPROPion (WELLBUTRIN XL) 300 MG 24 hr tablet Take 1 tablet by mouth daily.     busPIRone (BUSPAR) 15 MG tablet Take 1 tablet by mouth daily.     fish oil-omega-3 fatty acids 1000 MG capsule Take 1 g by mouth daily.     Liraglutide -Weight Management (SAXENDA) 18 MG/3ML SOPN Inject 3 mg as directed daily.     NON FORMULARY at bedtime. CPAP     testosterone cypionate (DEPOTESTOSTERONE CYPIONATE) 200 MG/ML injection Inject 0.5 mLs (100 mg total) into the muscle every 7 (seven) days. 5 mL 3   rosuvastatin (CRESTOR) 20 MG tablet Take 1 tablet (20 mg total) by mouth daily. 90 tablet 3   No current facility-administered medications on file prior to visit.    Allergies  Allergen Reactions   No Known Allergies     Social History   Substance and Sexual Activity  Alcohol Use Yes   Comment: 1-2 beers per month    Social History   Tobacco Use  Smoking Status Former   Smokeless Tobacco Never  Tobacco Comments   quit 30 years ago.    Review of Systems  Constitutional: Negative.   HENT: Negative.    Eyes: Negative.   Respiratory: Negative.    Cardiovascular: Negative.   Gastrointestinal: Negative.   Genitourinary: Negative.   Musculoskeletal: Negative.   Skin: Negative.   Neurological: Negative.   Endo/Heme/Allergies: Negative.   Psychiatric/Behavioral: Negative.     Objective   Vitals:   11/07/20 1307  BP: (!) 151/88  Pulse: 75  Resp: 14  Temp: 98.7 F (37.1 C)  SpO2: 96%    Physical Exam Vitals reviewed.  Constitutional:      Appearance: Normal appearance. He is not ill-appearing.  HENT:     Head: Normocephalic and atraumatic.  Cardiovascular:     Rate and Rhythm: Normal rate and regular rhythm.     Heart sounds: Normal heart sounds. No murmur heard.   No gallop.  Pulmonary:     Effort: Pulmonary effort is normal. No respiratory distress.     Breath sounds: Normal breath sounds. No stridor. No wheezing, rhonchi or rales.  Abdominal:     General: Bowel sounds are normal. There is no distension.     Palpations: Abdomen is soft. There is no mass.     Tenderness: There is no abdominal tenderness. There  is no guarding or rebound.     Hernia: No hernia is present.  Skin:    General: Skin is warm and dry.  Neurological:     Mental Status: He is alert and oriented to person, place, and time.    Assessment  Need for screening colonoscopy Plan  Patient is scheduled for a screening colonoscopy on 11/14/2020.  The risks and benefits of the procedure including bleeding and perforation were fully explained to the patient, who gave informed consent.  Sutabs pills have been prescribed preoperatively for bowel preparation.

## 2020-11-08 NOTE — Progress Notes (Signed)
Stephen Walton; [id number]; 11-08-1955   HPI Patient is a 65 year old white male who was referred to my care by Collene Mares for a screening colonoscopy.  Patient denies any abdominal pain, blood per in his stools, significant weight change, diarrhea, or constipation of significance.  He last had a colonoscopy over 10 years ago. Past Medical History:  Diagnosis Date   Chest pain, atypical    H/O echocardiogram 10/04/2011   EF >55%;mild concentric LVH; normals systolic & diastolic dysfunction; mild aortic stenosis   History of nuclear stress test 10/04/2011   low risk; small amount of basal inferior bowel artifact   Hyperlipidemia    OSA on CPAP     History reviewed. No pertinent surgical history.  Family History  Problem Relation Age of Onset   Heart attack Brother     Current Outpatient Medications on File Prior to Visit  Medication Sig Dispense Refill   alfuzosin (UROXATRAL) 10 MG 24 hr tablet Take 1 tablet (10 mg total) by mouth at bedtime. 30 tablet 11   aspirin 81 MG tablet Take 81 mg by mouth daily.     buPROPion (WELLBUTRIN XL) 300 MG 24 hr tablet Take 1 tablet by mouth daily.     busPIRone (BUSPAR) 15 MG tablet Take 1 tablet by mouth daily.     fish oil-omega-3 fatty acids 1000 MG capsule Take 1 g by mouth daily.     Liraglutide -Weight Management (SAXENDA) 18 MG/3ML SOPN Inject 3 mg as directed daily.     NON FORMULARY at bedtime. CPAP     testosterone cypionate (DEPOTESTOSTERONE CYPIONATE) 200 MG/ML injection Inject 0.5 mLs (100 mg total) into the muscle every 7 (seven) days. 5 mL 3   rosuvastatin (CRESTOR) 20 MG tablet Take 1 tablet (20 mg total) by mouth daily. 90 tablet 3   No current facility-administered medications on file prior to visit.    Allergies  Allergen Reactions   No Known Allergies     Social History   Substance and Sexual Activity  Alcohol Use Yes   Comment: 1-2 beers per month    Social History   Tobacco Use  Smoking Status Former   Smokeless Tobacco Never  Tobacco Comments   quit 30 years ago.    Review of Systems  Constitutional: Negative.   HENT: Negative.    Eyes: Negative.   Respiratory: Negative.    Cardiovascular: Negative.   Gastrointestinal: Negative.   Genitourinary: Negative.   Musculoskeletal: Negative.   Skin: Negative.   Neurological: Negative.   Endo/Heme/Allergies: Negative.   Psychiatric/Behavioral: Negative.     Objective   Vitals:   11/07/20 1307  BP: (!) 151/88  Pulse: 75  Resp: 14  Temp: 98.7 F (37.1 C)  SpO2: 96%    Physical Exam Vitals reviewed.  Constitutional:      Appearance: Normal appearance. He is not ill-appearing.  HENT:     Head: Normocephalic and atraumatic.  Cardiovascular:     Rate and Rhythm: Normal rate and regular rhythm.     Heart sounds: Normal heart sounds. No murmur heard.   No gallop.  Pulmonary:     Effort: Pulmonary effort is normal. No respiratory distress.     Breath sounds: Normal breath sounds. No stridor. No wheezing, rhonchi or rales.  Abdominal:     General: Bowel sounds are normal. There is no distension.     Palpations: Abdomen is soft. There is no mass.     Tenderness: There is no abdominal tenderness. There  is no guarding or rebound.     Hernia: No hernia is present.  Skin:    General: Skin is warm and dry.  Neurological:     Mental Status: He is alert and oriented to person, place, and time.    Assessment  Need for screening colonoscopy Plan  Patient is scheduled for a screening colonoscopy on 11/14/2020.  The risks and benefits of the procedure including bleeding and perforation were fully explained to the patient, who gave informed consent.  Sutabs pills have been prescribed preoperatively for bowel preparation.

## 2020-11-14 ENCOUNTER — Ambulatory Visit (HOSPITAL_COMMUNITY)
Admission: RE | Admit: 2020-11-14 | Discharge: 2020-11-14 | Disposition: A | Discharge status: Home / Self Care | Payer: Medicare Other | Attending: General Surgery | Admitting: General Surgery

## 2020-11-14 ENCOUNTER — Other Ambulatory Visit: Payer: Self-pay

## 2020-11-14 ENCOUNTER — Encounter (HOSPITAL_COMMUNITY): Payer: Self-pay | Admitting: General Surgery

## 2020-11-14 ENCOUNTER — Encounter (HOSPITAL_COMMUNITY)
Admission: RE | Disposition: A | Discharge status: Home / Self Care | Payer: Self-pay | Source: Home / Self Care | Attending: General Surgery

## 2020-11-14 DIAGNOSIS — K5730 Diverticulosis of large intestine without perforation or abscess without bleeding: Secondary | ICD-10-CM

## 2020-11-14 DIAGNOSIS — Z8249 Family history of ischemic heart disease and other diseases of the circulatory system: Secondary | ICD-10-CM | POA: Diagnosis not present

## 2020-11-14 DIAGNOSIS — Z7982 Long term (current) use of aspirin: Secondary | ICD-10-CM | POA: Diagnosis not present

## 2020-11-14 DIAGNOSIS — Z79899 Other long term (current) drug therapy: Secondary | ICD-10-CM | POA: Diagnosis not present

## 2020-11-14 DIAGNOSIS — Z87891 Personal history of nicotine dependence: Secondary | ICD-10-CM | POA: Diagnosis not present

## 2020-11-14 DIAGNOSIS — G4733 Obstructive sleep apnea (adult) (pediatric): Secondary | ICD-10-CM | POA: Insufficient documentation

## 2020-11-14 DIAGNOSIS — Z1211 Encounter for screening for malignant neoplasm of colon: Secondary | ICD-10-CM | POA: Diagnosis present

## 2020-11-14 DIAGNOSIS — E785 Hyperlipidemia, unspecified: Secondary | ICD-10-CM | POA: Diagnosis not present

## 2020-11-14 HISTORY — PX: COLONOSCOPY: SHX5424

## 2020-11-14 SURGERY — COLONOSCOPY
Anesthesia: Moderate Sedation

## 2020-11-14 MED ORDER — MEPERIDINE HCL 50 MG/ML IJ SOLN
INTRAMUSCULAR | Status: DC | PRN
  Administered 2020-11-14: 50 mg via INTRAVENOUS

## 2020-11-14 MED ORDER — MIDAZOLAM HCL 5 MG/5ML IJ SOLN
INTRAMUSCULAR | Status: AC
  Filled 2020-11-14: qty 10

## 2020-11-14 MED ORDER — MIDAZOLAM HCL 5 MG/5ML IJ SOLN
INTRAMUSCULAR | Status: DC | PRN
  Administered 2020-11-14: 2 mg via INTRAVENOUS
  Administered 2020-11-14 (×2): 1 mg via INTRAVENOUS

## 2020-11-14 MED ORDER — STERILE WATER FOR IRRIGATION IR SOLN
Status: DC | PRN
  Administered 2020-11-14: 1.5 mL

## 2020-11-14 MED ORDER — MEPERIDINE HCL 50 MG/ML IJ SOLN
INTRAMUSCULAR | Status: AC
  Filled 2020-11-14: qty 1

## 2020-11-14 MED ORDER — SODIUM CHLORIDE 0.9 % IV SOLN
INTRAVENOUS | Status: DC
  Administered 2020-11-14: 1000 mL via INTRAVENOUS

## 2020-11-14 NOTE — Op Note (Signed)
[facility] Patient Name: Stephen Walton Procedure Date: 11/14/2020 7:45 AM MRN: [id number] Date of Birth: 1955-07-14 Attending MD: Aviva Signs , MD CSN: [id number] Age: 65 Admit Type: Outpatient Procedure:                Colonoscopy Indications:              Screening for colorectal malignant neoplasm Providers:                Aviva Signs, MD, Charlsie Quest. Theda Sers RN, RN, Randa Spike, Technician Referring MD:              Medicines:                Midazolam 4 mg IV, Meperidine 50 mg IV Complications:            No immediate complications. Estimated Blood Loss:     Estimated blood loss: none. Procedure:                Pre-Anesthesia Assessment:                           - Prior to the procedure, a History and Physical                            was performed, and patient medications and                            allergies were reviewed. The patient is competent.                            The risks and benefits of the procedure and the                            sedation options and risks were discussed with the                            patient. All questions were answered and informed                            consent was obtained. Patient identification and                            proposed procedure were verified by the physician,                            the nurse and the technician in the endoscopy                            suite. Mental Status Examination: alert and                            oriented. Airway Examination: normal oropharyngeal  airway and neck mobility. Respiratory Examination:                            clear to auscultation. CV Examination: RRR, no                            murmurs, no S3 or S4. Prophylactic Antibiotics: The                            patient does not require prophylactic antibiotics.                            Prior Anticoagulants: The patient has taken no                             previous anticoagulant or antiplatelet agents                            except for aspirin. ASA Grade Assessment: II - A                            patient with mild systemic disease. After reviewing                            the risks and benefits, the patient was deemed in                            satisfactory condition to undergo the procedure.                            The anesthesia plan was to use moderate sedation /                            analgesia (conscious sedation). Immediately prior                            to administration of medications, the patient was                            re-assessed for adequacy to receive sedatives. The                            heart rate, respiratory rate, oxygen saturations,                            blood pressure, adequacy of pulmonary ventilation,                            and response to care were monitored throughout the                            procedure. The physical status of the patient was  re-assessed after the procedure.                           After obtaining informed consent, the colonoscope                            was passed under direct vision. Throughout the                            procedure, the patient's blood pressure, pulse, and                            oxygen saturations were monitored continuously. The                            [id number]) scope was introduced through the                            anus and advanced to the the cecum, identified by                            the appendiceal orifice, ileocecal valve and                            palpation. No anatomical landmarks were                            photographed. The entire colon was well visualized.                            The colonoscopy was performed without difficulty.                            The patient tolerated the procedure well. The                            quality of the bowel  preparation was adequate. The                            total duration of the procedure was 10 minutes. Scope In: 8:01:42 AM Scope Out: 8:09:41 AM Scope Withdrawal Time: 0 hours 4 minutes 13 seconds  Total Procedure Duration: 0 hours 7 minutes 59 seconds  Findings:      The perianal and digital rectal examinations were normal.      A few small-mouthed diverticula were found in the sigmoid colon.       Estimated blood loss: none.      The exam was otherwise without abnormality on direct and retroflexion       views. Impression:               - Diverticulosis in the sigmoid colon.                           - The examination was otherwise normal on direct  and retroflexion views.                           - No specimens collected. Moderate Sedation:      Moderate (conscious) sedation was administered by the endoscopy nurse       and supervised by the endoscopist. The following parameters were       monitored: oxygen saturation, heart rate, blood pressure, and response       to care. Recommendation:           - Written discharge instructions were provided to                            the patient.                           - The signs and symptoms of potential delayed                            complications were discussed with the patient.                           - Patient has a contact number available for                            emergencies.                           - Return to normal activities tomorrow.                           - Resume previous diet.                           - Continue present medications.                           - Repeat colonoscopy in 10 years for screening                            purposes. Procedure Code(s):        --- Professional ---                           [phone], Colonoscopy, flexible; diagnostic, including                            collection of specimen(s) by brushing or washing,                            when  performed (separate procedure) Diagnosis Code(s):        --- Professional ---                           Z12.11, Encounter for screening for malignant                            neoplasm of colon  K57.30, Diverticulosis of large intestine without                            perforation or abscess without bleeding CPT copyright 2019 [employer]. All rights reserved. The codes documented in this report are preliminary and upon coder review may  be revised to meet current compliance requirements. Aviva Signs, MD Aviva Signs, MD 11/14/2020 8:15:44 AM This report has been signed electronically. Number of Addenda: 0

## 2020-11-14 NOTE — Interval H&P Note (Signed)
History and Physical Interval Note:  11/14/2020 7:55 AM  Stephen Walton  has presented today for surgery, with the diagnosis of Screening Z12.11.  The various methods of treatment have been discussed with the patient and family. After consideration of risks, benefits and other options for treatment, the patient has consented to  Procedure(s): COLONOSCOPY (N/A) as a surgical intervention.  The patient's history has been reviewed, patient examined, no change in status, stable for surgery.  I have reviewed the patient's chart and labs.  Questions were answered to the patient's satisfaction.     Aviva Signs

## 2020-11-22 ENCOUNTER — Encounter (HOSPITAL_COMMUNITY): Payer: Self-pay | Admitting: General Surgery

## 2020-12-11 ENCOUNTER — Other Ambulatory Visit: Payer: Self-pay

## 2020-12-27 ENCOUNTER — Other Ambulatory Visit: Payer: Self-pay

## 2020-12-27 ENCOUNTER — Other Ambulatory Visit: Payer: Medicare Other

## 2020-12-27 DIAGNOSIS — N138 Other obstructive and reflux uropathy: Secondary | ICD-10-CM

## 2020-12-27 DIAGNOSIS — N401 Enlarged prostate with lower urinary tract symptoms: Secondary | ICD-10-CM

## 2021-01-03 ENCOUNTER — Other Ambulatory Visit: Payer: Self-pay

## 2021-01-03 ENCOUNTER — Encounter: Payer: Self-pay | Admitting: Urology

## 2021-01-03 ENCOUNTER — Ambulatory Visit (INDEPENDENT_AMBULATORY_CARE_PROVIDER_SITE_OTHER): Payer: Medicare Other | Admitting: Urology

## 2021-01-03 VITALS — BP 136/75 | HR 80

## 2021-01-03 DIAGNOSIS — R351 Nocturia: Secondary | ICD-10-CM | POA: Diagnosis not present

## 2021-01-03 DIAGNOSIS — E291 Testicular hypofunction: Secondary | ICD-10-CM

## 2021-01-03 DIAGNOSIS — N138 Other obstructive and reflux uropathy: Secondary | ICD-10-CM | POA: Diagnosis not present

## 2021-01-03 DIAGNOSIS — C61 Malignant neoplasm of prostate: Secondary | ICD-10-CM

## 2021-01-03 DIAGNOSIS — N401 Enlarged prostate with lower urinary tract symptoms: Secondary | ICD-10-CM | POA: Diagnosis not present

## 2021-01-03 MED ORDER — TESTOSTERONE CYPIONATE 200 MG/ML IM SOLN: 100.0000 mg | INTRAMUSCULAR | 3 refills | Status: DC

## 2021-01-03 MED ORDER — ALFUZOSIN HCL ER 10 MG PO TB24: 10.0000 mg | ORAL_TABLET | Freq: Two times a day (BID) | ORAL | 11 refills | Status: DC

## 2021-01-03 NOTE — Patient Instructions (Signed)
Benign Prostatic Hyperplasia Benign prostatic hyperplasia (BPH) is an enlarged prostate gland that is caused by the normal aging process and not by cancer. The prostate is a walnut-sized gland that is involved in the production of semen. It is located in front of the rectum and below the bladder. The bladder stores urine and the urethra is the tube that carries the urine out of the body. The prostate may get bigger as a man gets older. An enlarged prostate can press on the urethra. This can make it harder to pass urine. The build-up of urine in the bladder can cause infection. Back pressure and infection may progress to bladder damage and kidney (renal) failure. What are the causes? This condition is part of a normal aging process. However, not all men develop problems from this condition. If the prostate enlarges away from the urethra, urine flow will not be blocked. If it enlarges toward the urethra and compresses it, there will be problems passing urine. What increases the risk? This condition is more likely to develop in men over the age of 50 years. What are the signs or symptoms? Symptoms of this condition include: Getting up often during the night to urinate. Needing to urinate frequently during the day. Difficulty starting urine flow. Decrease in size and strength of your urine stream. Leaking (dribbling) after urinating. Inability to pass urine. This needs immediate treatment. Inability to completely empty your bladder. Pain when you pass urine. This is more common if there is also an infection. Urinary tract infection (UTI). How is this diagnosed? This condition is diagnosed based on your medical history, a physical exam, and your symptoms. Tests will also be done, such as: A post-void bladder scan. This measures any amount of urine that may remain in your bladder after you finish urinating. A digital rectal exam. In a rectal exam, your health care provider checks your prostate by  putting a lubricated, gloved finger into your rectum to feel the back of your prostate gland. This exam detects the size of your gland and any abnormal lumps or growths. An exam of your urine (urinalysis). A prostate specific antigen (PSA) screening. This is a blood test used to screen for prostate cancer. An ultrasound. This test uses sound waves to electronically produce a picture of your prostate gland. Your health care provider may refer you to a specialist in kidney and prostate diseases (urologist). How is this treated? Once symptoms begin, your health care provider will monitor your condition (active surveillance or watchful waiting). Treatment for this condition will depend on the severity of your condition. Treatment may include: Observation and yearly exams. This may be the only treatment needed if your condition and symptoms are mild. Medicines to relieve your symptoms, including: Medicines to shrink the prostate. Medicines to relax the muscle of the prostate. Surgery in severe cases. Surgery may include: Prostatectomy. In this procedure, the prostate tissue is removed completely through an open incision or with a laparoscope or robotics. Transurethral resection of the prostate (TURP). In this procedure, a tool is inserted through the opening at the tip of the penis (urethra). It is used to cut away tissue of the inner core of the prostate. The pieces are removed through the same opening of the penis. This removes the blockage. Transurethral incision (TUIP). In this procedure, small cuts are made in the prostate. This lessens the prostate's pressure on the urethra. Transurethral microwave thermotherapy (TUMT). This procedure uses microwaves to create heat. The heat destroys and removes a   small amount of prostate tissue. Transurethral needle ablation (TUNA). This procedure uses radio frequencies to destroy and remove a small amount of prostate tissue. Interstitial laser coagulation (ILC).  This procedure uses a laser to destroy and remove a small amount of prostate tissue. Transurethral electrovaporization (TUVP). This procedure uses electrodes to destroy and remove a small amount of prostate tissue. Prostatic urethral lift. This procedure inserts an implant to push the lobes of the prostate away from the urethra. Follow these instructions at home: Take over-the-counter and prescription medicines only as told by your health care provider. Monitor your symptoms for any changes. Contact your health care provider with any changes. Avoid drinking large amounts of liquid before going to bed or out in public. Avoid or reduce how much caffeine or alcohol you drink. Give yourself time when you urinate. Keep all follow-up visits as told by your health care provider. This is important. Contact a health care provider if: You have unexplained back pain. Your symptoms do not get better with treatment. You develop side effects from the medicine you are taking. Your urine becomes very dark or has a bad smell. Your lower abdomen becomes distended and you have trouble passing your urine. Get help right away if: You have a fever or chills. You suddenly cannot urinate. You feel lightheaded, or very dizzy, or you faint. There are large amounts of blood or clots in the urine. Your urinary problems become hard to manage. You develop moderate to severe low back or flank pain. The flank is the side of your body between the ribs and the hip. These symptoms may represent a serious problem that is an emergency. Do not wait to see if the symptoms will go away. Get medical help right away. Call your local emergency services (911 in the [place] Do not drive yourself to the hospital. Summary Benign prostatic hyperplasia (BPH) is an enlarged prostate that is caused by the normal aging process and not by cancer. An enlarged prostate can press on the urethra. This can make it hard to pass urine. This  condition is part of a normal aging process and is more likely to develop in men over the age of 50 years. Get help right away if you suddenly cannot urinate. This information is not intended to replace advice given to you by your health care provider. Make sure you discuss any questions you have with your health care provider. Document Revised: 06/14/2020 Document Reviewed: 11/11/2019 Elsevier Patient Education  2022 [employer]  

## 2021-01-03 NOTE — Progress Notes (Signed)
post void residual=20  Urological Symptom Review  Patient is experiencing the following symptoms: Frequent urination Get up at night to urinate Leakage of urine   Review of Systems  Gastrointestinal (upper)  : Negative for upper GI symptoms  Gastrointestinal (lower) : Diarrhea  Constitutional : Negative for symptoms  Skin: Negative for skin symptoms  Eyes: Negative for eye symptoms  Ear/Nose/Throat : Negative for Ear/Nose/Throat symptoms  Hematologic/Lymphatic: Negative for Hematologic/Lymphatic symptoms  Cardiovascular : Negative for cardiovascular symptoms  Respiratory : Negative for respiratory symptoms  Endocrine: Negative for endocrine symptoms  Musculoskeletal: Back pain  Neurological: Negative for neurological symptoms  Psychologic: Negative for psychiatric symptoms

## 2021-01-03 NOTE — Progress Notes (Signed)
01/03/2021 9:13 AM   Stephen Walton [birth year] [id number]  Referring provider: [facility] [address],  [place] [address]  Followup Prostate cancer and nocturia   HPI: Mr Krenz is a 65yo here for followup for prostate cancer and nocturia. IPSS 7 QOL 3. He is bothered by his nocturia which [place] 3x. He is on uroxatral 10mg  qhs. PSA increased to 6.6 from 4.6. He had a negative prostate biopsy recently and a negative MRI in 2019. He is on 100mg  IM testosterone weekly. Hgb 13.7. no recent testosterone level. No other complaints today   PMH: Past Medical History:  Diagnosis Date   Chest pain, atypical    H/O echocardiogram 10/04/2011   EF >55%;mild concentric LVH; normals systolic & diastolic dysfunction; mild aortic stenosis   History of nuclear stress test 10/04/2011   low risk; small amount of basal inferior bowel artifact   Hyperlipidemia    OSA on CPAP     Surgical History: Past Surgical History:  Procedure Laterality Date   COLONOSCOPY N/A 11/14/2020   Procedure: COLONOSCOPY;  Surgeon: Aviva Signs, MD;  [facility];  Service: Gastroenterology;  Laterality: N/A;    Home Medications:  Allergies as of 01/03/2021       Reactions   No Known Allergies         Medication List        Accurate as of January 03, 2021  9:13 AM. If you have any questions, ask your nurse or doctor.          STOP taking these medications    DRY EYES OP Stopped by: Nicolette Bang, MD       TAKE these medications    alfuzosin 10 MG 24 hr tablet Commonly known as: UROXATRAL Take 1 tablet (10 mg total) by mouth at bedtime.   aspirin 81 MG tablet Take 81 mg by mouth daily.   buPROPion 300 MG 24 hr tablet Commonly known as: WELLBUTRIN XL Take 300 mg by mouth daily.   busPIRone 15 MG tablet Commonly known as: BUSPAR Take 15 mg by mouth 2 (two) times daily.   fish oil-omega-3 fatty acids 1000 MG capsule Take 1 g by  mouth daily.   NON FORMULARY at bedtime. CPAP   rosuvastatin 20 MG tablet Commonly known as: CRESTOR Take 1 tablet (20 mg total) by mouth daily.   Saxenda 18 MG/3ML Sopn Generic drug: Liraglutide -Weight Management Inject 3 mg as directed daily.   testosterone cypionate 200 MG/ML injection Commonly known as: DEPOTESTOSTERONE CYPIONATE Inject 0.5 mLs (100 mg total) into the muscle every 7 (seven) days.        Allergies:  Allergies  Allergen Reactions   No Known Allergies     Family History: Family History  Problem Relation Age of Onset   Heart attack Brother     Social History:  reports that he has quit smoking. He has never used smokeless tobacco. He reports current alcohol use. He reports that he does not use drugs.  ROS: All other review of systems were reviewed and are negative except what is noted above in HPI  Physical Exam: BP 136/75   Pulse 80   Constitutional:  Alert and oriented, No acute distress. HEENT: [place] AT, moist mucus membranes.  Trachea midline, no masses. Cardiovascular: No clubbing, cyanosis, or edema. Respiratory: Normal respiratory effort, no increased work of breathing. GI: Abdomen is soft, nontender, nondistended, no abdominal masses GU: No CVA tenderness.  Lymph:  No cervical or inguinal lymphadenopathy. Skin: No rashes, bruises or suspicious lesions. Neurologic: Grossly intact, no focal deficits, moving all 4 extremities. Psychiatric: Normal mood and affect.  Laboratory Data: Lab Results  Component Value Date   WBC 8.3 04/19/2020   HGB 14.5 04/19/2020   HCT 48.4 04/19/2020   MCV 74 (L) 04/19/2020   PLT 323 04/19/2020    Lab Results  Component Value Date   CREATININE 1.09 04/19/2020    Lab Results  Component Value Date   PSA 4.0 10/21/2019   PSA 3.9 03/22/2019    Lab Results  Component Value Date   TESTOSTERONE 673 04/19/2020    No results found for: HGBA1C  Urinalysis    Component Value Date/Time   APPEARANCEUR  Clear 04/26/2020 1028   GLUCOSEU Negative 04/26/2020 1028   BILIRUBINUR Negative 04/26/2020 1028   PROTEINUR Negative 04/26/2020 1028   NITRITE Negative 04/26/2020 1028   LEUKOCYTESUR Negative 04/26/2020 1028    Lab Results  Component Value Date   LABMICR Comment 04/26/2020   WBCUA 0-5 10/25/2019   LABEPIT None seen 10/25/2019   BACTERIA None seen 10/25/2019    Pertinent Imaging:  No results found for this or any previous visit.  No results found for this or any previous visit.  No results found for this or any previous visit.  No results found for this or any previous visit.  Results for orders placed during the hospital encounter of 04/24/15  US Renal  Narrative CLINICAL DATA:  Proteinuria  EXAM: RENAL / URINARY TRACT ULTRASOUND COMPLETE  COMPARISON:  None.  FINDINGS: Right Walton:  Length: 12.7 cm. Echogenicity within normal limits. No mass or hydronephrosis visualized.  Left Walton:  Length: 12.0 cm. Echogenicity within normal limits. No mass or hydronephrosis visualized.  Bladder:  Appears normal for degree of bladder distention.  IMPRESSION: Normal renal ultrasound.   Electronically Signed By: Franchot Gallo M.D. On: 04/24/2015 09:56  No results found for this or any previous visit.  No results found for this or any previous visit.  No results found for this or any previous visit.   Assessment & Plan:    1. Benign prostatic hyperplasia with urinary obstruction -increase uroxatral to BID - Urinalysis, Routine w reflex microscopic - BLADDER SCAN AMB NON-IMAGING  2. Prostate cancer ([place]) -RCT 6 months with PSA  3. Hypogonadism male -Continue IM testosterone 100mg  weekly  4. Nocturia -increase uroxatral to 10mg  BID   No follow-ups on file.  Nicolette Bang, MD  [facility] [place]

## 2021-01-04 LAB — MICROSCOPIC EXAMINATION
RBC, Urine: NONE SEEN /hpf (ref 0–2)
Renal Epithel, UA: NONE SEEN /hpf

## 2021-01-04 LAB — URINALYSIS, ROUTINE W REFLEX MICROSCOPIC
Bilirubin, UA: NEGATIVE
Glucose, UA: NEGATIVE
Ketones, UA: NEGATIVE
Nitrite, UA: NEGATIVE
RBC, UA: NEGATIVE
Specific Gravity, UA: >1.03 — ABNORMAL HIGH (ref 1.005–1.030)
Urobilinogen, Ur: 1 mg/dL (ref 0.2–1.0)
pH, UA: 5.5 (ref 5.0–7.5)

## 2021-01-07 ENCOUNTER — Encounter: Payer: Self-pay | Admitting: Urology

## 2021-01-08 ENCOUNTER — Ambulatory Visit: Payer: Medicare Other | Admitting: Urology

## 2021-03-18 DIAGNOSIS — I5020 Unspecified systolic (congestive) heart failure: Secondary | ICD-10-CM

## 2021-03-18 DIAGNOSIS — I5022 Chronic systolic (congestive) heart failure: Secondary | ICD-10-CM

## 2021-03-18 HISTORY — DX: Chronic systolic (congestive) heart failure: I50.22

## 2021-03-18 HISTORY — DX: Unspecified systolic (congestive) heart failure: I50.20

## 2021-06-27 ENCOUNTER — Other Ambulatory Visit: Payer: Medicare Other

## 2021-07-04 ENCOUNTER — Other Ambulatory Visit: Payer: Medicare Other

## 2021-07-04 ENCOUNTER — Ambulatory Visit: Payer: Medicare Other | Admitting: Urology

## 2021-07-04 DIAGNOSIS — E291 Testicular hypofunction: Secondary | ICD-10-CM

## 2021-07-04 DIAGNOSIS — C61 Malignant neoplasm of prostate: Secondary | ICD-10-CM

## 2021-07-06 LAB — TESTOSTERONE,FREE AND TOTAL
Testosterone, Free: 16.8 pg/mL (ref 6.6–18.1)
Testosterone: 866 ng/dL (ref 264–916)

## 2021-07-06 LAB — CBC WITH DIFFERENTIAL
Basophils Absolute: 0.1 10*3/uL (ref 0.0–0.2)
Basos: 1 %
EOS (ABSOLUTE): 0.5 10*3/uL — ABNORMAL HIGH (ref 0.0–0.4)
Eos: 6 %
Hematocrit: 42.5 % (ref 37.5–51.0)
Hemoglobin: 12.7 g/dL — ABNORMAL LOW (ref 13.0–17.7)
Immature Grans (Abs): 0 10*3/uL (ref 0.0–0.1)
Immature Granulocytes: 0 %
Lymphocytes Absolute: 2 10*3/uL (ref 0.7–3.1)
Lymphs: 24 %
MCH: 21.9 pg — ABNORMAL LOW (ref 26.6–33.0)
MCHC: 29.9 g/dL — ABNORMAL LOW (ref 31.5–35.7)
MCV: 73 fL — ABNORMAL LOW (ref 79–97)
Monocytes Absolute: 0.9 10*3/uL (ref 0.1–0.9)
Monocytes: 11 %
Neutrophils Absolute: 4.9 10*3/uL (ref 1.4–7.0)
Neutrophils: 58 %
RBC: 5.79 x10E6/uL (ref 4.14–5.80)
RDW: 18.6 % — ABNORMAL HIGH (ref 11.6–15.4)
WBC: 8.4 10*3/uL (ref 3.4–10.8)

## 2021-07-06 LAB — PSA, TOTAL AND FREE
PSA, Free Pct: 21.1 %
PSA, Free: 1.35 ng/mL
Prostate Specific Ag, Serum: 6.4 ng/mL — ABNORMAL HIGH (ref 0.0–4.0)

## 2021-07-11 ENCOUNTER — Encounter: Payer: Self-pay | Admitting: Urology

## 2021-07-11 ENCOUNTER — Ambulatory Visit (INDEPENDENT_AMBULATORY_CARE_PROVIDER_SITE_OTHER): Payer: Medicare Other | Admitting: Urology

## 2021-07-11 VITALS — BP 154/83 | HR 87

## 2021-07-11 DIAGNOSIS — N401 Enlarged prostate with lower urinary tract symptoms: Secondary | ICD-10-CM

## 2021-07-11 DIAGNOSIS — R351 Nocturia: Secondary | ICD-10-CM | POA: Diagnosis not present

## 2021-07-11 DIAGNOSIS — E291 Testicular hypofunction: Secondary | ICD-10-CM

## 2021-07-11 DIAGNOSIS — N138 Other obstructive and reflux uropathy: Secondary | ICD-10-CM | POA: Diagnosis not present

## 2021-07-11 DIAGNOSIS — C61 Malignant neoplasm of prostate: Secondary | ICD-10-CM | POA: Diagnosis not present

## 2021-07-11 LAB — URINALYSIS, ROUTINE W REFLEX MICROSCOPIC
Bilirubin, UA: NEGATIVE
Glucose, UA: NEGATIVE
Ketones, UA: NEGATIVE
Leukocytes,UA: NEGATIVE
Nitrite, UA: NEGATIVE
RBC, UA: NEGATIVE
Specific Gravity, UA: >1.03 — ABNORMAL HIGH (ref 1.005–1.030)
Urobilinogen, Ur: 1 mg/dL (ref 0.2–1.0)
pH, UA: 6 (ref 5.0–7.5)

## 2021-07-11 LAB — MICROSCOPIC EXAMINATION
Bacteria, UA: NONE SEEN
Epithelial Cells (non renal): NONE SEEN /hpf (ref 0–10)
Renal Epithel, UA: NONE SEEN /hpf
WBC, UA: NONE SEEN /hpf (ref 0–5)

## 2021-07-11 MED ORDER — TESTOSTERONE CYPIONATE 200 MG/ML IM SOLN
100.0000 mg | INTRAMUSCULAR | 3 refills | Status: DC
Start: 2021-07-11 — End: 2021-12-26

## 2021-07-11 MED ORDER — ALFUZOSIN HCL ER 10 MG PO TB24: 10.0000 mg | ORAL_TABLET | Freq: Two times a day (BID) | ORAL | 11 refills | Status: DC

## 2021-07-11 NOTE — Patient Instructions (Signed)

## 2021-07-11 NOTE — Progress Notes (Signed)
? ?07/11/2021 ?3:02 PM  ? ?Stephen Walton ?[birth year] ?[id number] ? ?Referring provider: Cory Munch, PA-C ?[address] ?[address] ?[place],  [place] [address] ? ?Followup hypogonadism and prostate cancer ? ? ?HPI: ?Stephen Walton is a 66yo here for followup for prostate cancer and hypogonadism. PSA 6.4 up from 4.5. No worsening LUTS. Testosterone 866, hgb 12.7. Energy Good. Good libido. IPSS 20 QOL 3. Nocturia 5x. He has starting/stopping of his stream.  Patient wears a CPAP. Urine stream fair. His daytime urine frequency has improved on uroxatral '10mg'$  BID.  ? ? ?PMH: ?Past Medical History:  ?Diagnosis Date  ? Chest pain, atypical   ? H/O echocardiogram 10/04/2011  ? EF >55%;mild concentric LVH; normals systolic & diastolic dysfunction; mild aortic stenosis  ? History of nuclear stress test 10/04/2011  ? low risk; small amount of basal inferior bowel artifact  ? Hyperlipidemia   ? OSA on CPAP   ? ? ?Surgical History: ?Past Surgical History:  ?Procedure Laterality Date  ? COLONOSCOPY N/A 11/14/2020  ? Procedure: COLONOSCOPY;  Surgeon: Aviva Signs, MD;  [facility];  Service: Gastroenterology;  Laterality: N/A;  ? ? ?Home Medications:  ?Allergies as of 07/11/2021   ? ?   Reactions  ? No Known Allergies   ? ?  ? ?  ?Medication List  ?  ? ?  ? Accurate as of July 11, 2021  3:02 PM. If you have any questions, ask your nurse or doctor.  ?  ?  ? ?  ? ?alfuzosin 10 MG 24 hr tablet ?Commonly known as: UROXATRAL ?Take 1 tablet (10 mg total) by mouth in the morning and at bedtime. ?  ?aspirin 81 MG tablet ?Take 81 mg by mouth daily. ?  ?buPROPion 300 MG 24 hr tablet ?Commonly known as: WELLBUTRIN XL ?Take 300 mg by mouth daily. ?  ?busPIRone 15 MG tablet ?Commonly known as: BUSPAR ?Take 15 mg by mouth 2 (two) times daily. ?  ?fish oil-omega-3 fatty acids 1000 MG capsule ?Take 1 g by mouth daily. ?  ?NON FORMULARY ?at bedtime. CPAP ?  ?rosuvastatin 20 MG tablet ?Commonly known as: CRESTOR ?Take 1 tablet (20 mg  total) by mouth daily. ?  ?Saxenda 18 MG/3ML Sopn ?Generic drug: Liraglutide -Weight Management ?Inject 3 mg as directed daily. ?  ?testosterone cypionate 200 MG/ML injection ?Commonly known as: DEPOTESTOSTERONE CYPIONATE ?Inject 0.5 mLs (100 mg total) into the muscle every 7 (seven) days. ?  ? ?  ? ? ?Allergies:  ?Allergies  ?Allergen Reactions  ? No Known Allergies   ? ? ?Family History: ?Family History  ?Problem Relation Age of Onset  ? Heart attack Brother   ? ? ?Social History:  reports that he has quit smoking. He has never used smokeless tobacco. He reports current alcohol use. He reports that he does not use drugs. ? ?ROS: ?All other review of systems were reviewed and are negative except what is noted above in HPI ? ?Physical Exam: ?BP (!) 154/83   Pulse 87   ?Constitutional:  Alert and oriented, No acute distress. ?HEENT: [place] AT, moist mucus membranes.  Trachea midline, no masses. ?Cardiovascular: No clubbing, cyanosis, or edema. ?Respiratory: Normal respiratory effort, no increased work of breathing. ?GI: Abdomen is soft, nontender, nondistended, no abdominal masses ?GU: No CVA tenderness.  ?Lymph: No cervical or inguinal lymphadenopathy. ?Skin: No rashes, bruises or suspicious lesions. ?Neurologic: Grossly intact, no focal deficits, moving all 4 extremities. ?Psychiatric: Normal mood and affect. ? ?Laboratory Data: ?  Lab Results  ?Component Value Date  ? WBC 8.4 07/04/2021  ? HGB 12.7 (L) 07/04/2021  ? HCT 42.5 07/04/2021  ? MCV 73 (L) 07/04/2021  ? PLT 323 04/19/2020  ? ? ?Lab Results  ?Component Value Date  ? CREATININE 1.09 04/19/2020  ? ? ?Lab Results  ?Component Value Date  ? PSA 4.0 10/21/2019  ? PSA 3.9 03/22/2019  ? ? ?Lab Results  ?Component Value Date  ? TESTOSTERONE 866 07/04/2021  ? ? ?No results found for: HGBA1C ? ?Urinalysis ?   ?Component Value Date/Time  ? APPEARANCEUR Clear 01/03/2021 0844  ? GLUCOSEU Negative 01/03/2021 0844  ? [place] Negative 01/03/2021 0844  ? PROTEINUR 2+ (A)  01/03/2021 0844  ? NITRITE Negative 01/03/2021 0844  ? LEUKOCYTESUR Trace (A) 01/03/2021 0844  ? ? ?Lab Results  ?Component Value Date  ? LABMICR See below: 01/03/2021  ? WBCUA 0-5 01/03/2021  ? LABEPIT 0-10 01/03/2021  ? MUCUS Present 01/03/2021  ? BACTERIA Few 01/03/2021  ? ? ?Pertinent Imaging: ? ?No results found for this or any previous visit. ? ?No results found for this or any previous visit. ? ?No results found for this or any previous visit. ? ?No results found for this or any previous visit. ? ?Results for orders placed during the hospital encounter of 04/24/15 ? ?US Renal ? ?Narrative ?CLINICAL DATA:  Proteinuria ? ?EXAM: ?RENAL / URINARY TRACT ULTRASOUND COMPLETE ? ?COMPARISON:  None. ? ?FINDINGS: ?Right Walton: ? ?Length: 12.7 cm. Echogenicity within normal limits. No mass or ?hydronephrosis visualized. ? ?Left Walton: ? ?Length: 12.0 cm. Echogenicity within normal limits. No mass or ?hydronephrosis visualized. ? ?Bladder: ? ?Appears normal for degree of bladder distention. ? ?IMPRESSION: ?Normal renal ultrasound. ? ? ?Electronically Signed ?By: Franchot Gallo M.D. ?On: 04/24/2015 09:56 ? ?No results found for this or any previous visit. ? ?No results found for this or any previous visit. ? ?No results found for this or any previous visit. ? ? ?Assessment & Plan:   ? ?1. Benign prostatic hyperplasia with urinary obstruction ?-Continue uroxatral '10mg'$  BID.  ? ?2. Prostate cancer ([place]) ?-RTC 6 months with labs ? ?3. Hypogonadism male ?-RTC 6 months with testosterone labs ? ?4. Nocturia ?-Uroxatral '10mg'$  BID ? ? ?No follow-ups on file. ? ?Nicolette Bang, MD ? ?[facility] ? ? ?

## 2021-08-10 ENCOUNTER — Ambulatory Visit: Payer: Medicare Other | Admitting: Physician Assistant

## 2021-08-23 ENCOUNTER — Ambulatory Visit (INDEPENDENT_AMBULATORY_CARE_PROVIDER_SITE_OTHER): Payer: Medicare Other | Admitting: Physician Assistant

## 2021-08-23 ENCOUNTER — Encounter: Payer: Self-pay | Admitting: Physician Assistant

## 2021-08-23 VITALS — BP 122/78 | HR 75 | Ht 69.0 in | Wt 247.8 lb

## 2021-08-23 DIAGNOSIS — E785 Hyperlipidemia, unspecified: Secondary | ICD-10-CM | POA: Diagnosis not present

## 2021-08-23 DIAGNOSIS — Z01818 Encounter for other preprocedural examination: Secondary | ICD-10-CM

## 2021-08-23 DIAGNOSIS — R072 Precordial pain: Secondary | ICD-10-CM

## 2021-08-23 MED ORDER — METOPROLOL TARTRATE 100 MG PO TABS: ORAL_TABLET | ORAL | 0 refills | Status: DC

## 2021-08-23 MED ORDER — NITROGLYCERIN 0.4 MG SL SUBL: 0.4000 mg | SUBLINGUAL_TABLET | SUBLINGUAL | 2 refills | Status: DC | PRN

## 2021-08-23 MED ORDER — ROSUVASTATIN CALCIUM 20 MG PO TABS: 20.0000 mg | ORAL_TABLET | Freq: Every day | ORAL | 3 refills | Status: DC

## 2021-08-23 NOTE — Patient Instructions (Addendum)
Medication Instructions:  TAKE Nitroglycerin 0.4 as needed for chest pain. DO NOT take more than 3 tablets in an episode TAKE Metoprolol Tartrate (Lopressor) 100 mg 2 hours prior to Coronary CTA  *If you need a refill on your cardiac medications before your next appointment, please call your pharmacy*  Lab Work: Your physician recommends that you return for lab work 1 week prior to Coronary CTA:  BMET  If you have labs (blood work) drawn today and your tests are completely normal, you will receive your results only by: [place] (if you have MyChart) OR A paper copy in the mail If you have any lab test that is abnormal or we need to change your treatment, we will call you to review the results.  Testing/Procedures: Your physician has requested that you have cardiac CT. Cardiac computed tomography (CT) is a painless test that uses an x-ray machine to take clear, detailed pictures of your heart. For further information please visit HugeFiesta.tn. Please follow instruction sheet as given.  Your physician has requested that you have an echocardiogram. Echocardiography is a painless test that uses sound waves to create images of your heart. It provides your doctor with information about the size and shape of your heart and how well your heart's chambers and valves are working. This procedure takes approximately one hour. There are no restrictions for this procedure.  Follow-Up: At [facility], you and your health needs are our priority.  As part of our continuing mission to provide you with exceptional heart care, we have created designated Provider Care Teams.  These Care Teams include your primary Cardiologist (physician) and Advanced Practice Providers (APPs -  Physician Assistants and Nurse Practitioners) who all work together to provide you with the care you need, when you need it.    Your next appointment:   4-6 week(s)  The format for your next appointment:   In  Person  Provider:   Almyra Deforest, PA-C        Other Instructions   Your cardiac CT will be scheduled at one of the below locations:   [facility] [address] [place], [place] [address] [phone]  [place] [address] [place], [place] [address] [phone]  If scheduled at [facility], please arrive at the [facility] and Children's Entrance (Entrance C2) of [facility] 30 minutes prior to test start time. You can use the FREE valet parking offered at entrance C (encouraged to control the heart rate for the test)  Proceed to the [facility] Radiology Department (first floor) to check-in and test prep.  All radiology patients and guests should use entrance C2 at [facility], accessed from [facility], even though the hospital's physical address listed is [address].    If scheduled at [facility], please arrive 15 mins early for check-in and test prep.  Please follow these instructions carefully (unless otherwise directed):  Hold all erectile dysfunction medications at least 3 days (72 hrs) prior to test.  On the Night Before the Test: Be sure to Drink plenty of water. Do not consume any caffeinated/decaffeinated beverages or chocolate 12 hours prior to your test. Do not take any antihistamines 12 hours prior to your test.   On the Day of the Test: Drink plenty of water until 1 hour prior to the test. Do not eat any food 4 hours prior to the test. You may  take your regular medications prior to the test.  Take metoprolol (Lopressor) two hours prior to test.   After the Test: Drink plenty of water. After receiving IV contrast, you may experience a mild flushed feeling. This is normal. On occasion, you may experience a mild rash up to 24 hours after the test. This is not dangerous. If this occurs, you can take Benadryl 25 mg and  increase your fluid intake. If you experience trouble breathing, this can be serious. If it is severe call 911 IMMEDIATELY. If it is mild, please call our office. If you take any of these medications: Glipizide/Metformin, Avandament, Glucavance, please do not take 48 hours after completing test unless otherwise instructed.  We will call to schedule your test 2-4 weeks out understanding that some insurance companies will need an authorization prior to the service being performed.   For non-scheduling related questions, please contact the cardiac imaging nurse navigator should you have any questions/concerns: Marchia Bond, Cardiac Imaging Nurse Navigator Gordy Clement, Cardiac Imaging Nurse Navigator [facility] Direct Office Dial: [phone]   For scheduling needs, including cancellations and rescheduling, please call [place], [phone].   Important Information About Sugar

## 2021-08-23 NOTE — Progress Notes (Unsigned)
Cardiology Office Note:    Date:  08/23/2021   ID:  Stephen Walton, DOB [birth year], MRN [id number]  PCP:  Ginger Organ   [facility] Providers Cardiologist:  Shelva Majestic, MD { Click to update primary MD,subspecialty MD or APP then REFRESH:1}    Referring MD: Cory Munch, PA-C   No chief complaint on file. ***  History of Present Illness:    Stephen Walton is a 66 y.o. male with a hx of OSA on CPAP, obesity, hyperlipidemia, and history of chest pain with negative work-up.  Patient previously had chest pain shortly after his brother had cardiac arrest.  Echocardiogram obtained at the time showed moderate LVH with normal systolic and diastolic function.  Myoview obtained in July 2013 showed normal perfusion.  I last saw the patient in December 2021 at which time he had retired however remains very active, he was doing well at the time.  Lipid panel showed very well-controlled cholesterol.  Patient presents today for delayed 1 year follow-up.  He was in his usual state of health until he was working out in his yard on Sunday 08/19/2021 when he started having chest pain across his anterior chest.  Symptom lasted for the remainder of Sunday and even when he went to sleep.  By the time he woke up on Monday, chest pain has resolved.  He did not seek medical attention at that time.  EKG today shows no significant ST-T wave changes.  His brother had a cardiac arrest at age 74, given his recent symptom, I recommended echocardiogram and a coronary CT.  I will give him a prescription for sublingual nitroglycerin.  I will refill his Crestor.  Last lipid panel obtained in September 2022 showed very well-controlled cholesterol.  It is difficult to say if he is recent chest discomfort is cardiac or not, I did instruct the patient to seek urgent medical attention if he has any recurrent chest pain not alleviated after 3 nitroglycerin.  Otherwise, I will try to push for the earliest  echocardiogram and coronary CT in this case given the significant family history of early CAD.   Past Medical History:  Diagnosis Date   Chest pain, atypical    H/O echocardiogram 10/04/2011   EF >55%;mild concentric LVH; normals systolic & diastolic dysfunction; mild aortic stenosis   History of nuclear stress test 10/04/2011   low risk; small amount of basal inferior bowel artifact   Hyperlipidemia    OSA on CPAP     Past Surgical History:  Procedure Laterality Date   COLONOSCOPY N/A 11/14/2020   Procedure: COLONOSCOPY;  Surgeon: Aviva Signs, MD;  [facility];  Service: Gastroenterology;  Laterality: N/A;    Current Medications: Current Meds  Medication Sig   alfuzosin (UROXATRAL) 10 MG 24 hr tablet Take 1 tablet (10 mg total) by mouth in the morning and at bedtime.   aspirin 81 MG tablet Take 81 mg by mouth daily.   buPROPion (WELLBUTRIN XL) 300 MG 24 hr tablet Take 300 mg by mouth daily.   busPIRone (BUSPAR) 15 MG tablet Take 15 mg by mouth 2 (two) times daily.   fish oil-omega-3 fatty acids 1000 MG capsule Take 1 g by mouth daily.   Liraglutide -Weight Management (SAXENDA) 18 MG/3ML SOPN Inject 3 mg as directed daily.   NON FORMULARY at bedtime. CPAP   rosuvastatin (CRESTOR) 20 MG tablet Take 1 tablet (20 mg total) by mouth daily.   testosterone cypionate (DEPOTESTOSTERONE CYPIONATE)  200 MG/ML injection Inject 0.5 mLs (100 mg total) into the muscle every 7 (seven) days.     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  Vaping Use   Vaping Use: Never used  Substance and Sexual Activity    Alcohol use: Yes    Comment: 1-2 beers per month   Drug use: Never   Sexual activity: Not on file  Other Topics Concern   Not on file  Social History Narrative   Not on file   Social Determinants of Health   Financial Resource Strain: Not on file  Food Insecurity: Not on file  Transportation Needs: Not on file  Physical Activity: Not on file  Stress: Not on file  Social Connections: Not on file     Family History: The patient's ***family history includes Heart attack in his brother.  ROS:   Please see the history of present illness.    *** All other systems reviewed and are negative.  EKGs/Labs/Other Studies Reviewed:    The following studies were reviewed today: ***  EKG:  EKG is *** ordered today.  The ekg ordered today demonstrates ***  Recent Labs: 07/04/2021: Hemoglobin 12.7  Recent Lipid Panel    Component Value Date/Time   CHOL 110 05/05/2020 1209   TRIG 77 05/05/2020 1209   HDL 43 05/05/2020 1209   CHOLHDL 2.6 05/05/2020 1209   LDLCALC 51 05/05/2020 1209     Risk Assessment/Calculations:   {Does this patient have ATRIAL FIBRILLATION?[phone]}       Physical Exam:    VS:  BP 122/78   Pulse 75   Ht '5\' 9"'$  (1.753 m)   Wt 247 lb 12.8 oz (112.4 kg)   SpO2 97%   BMI 36.59 kg/m     Wt Readings from Last 3 Encounters:  08/23/21 247 lb 12.8 oz (112.4 kg)  11/07/20 249 lb (112.9 kg)  04/26/20 245 lb (111.1 kg)     GEN: *** Well nourished, well developed in no acute distress HEENT: Normal NECK: No JVD; No carotid bruits LYMPHATICS: No lymphadenopathy CARDIAC: ***RRR, no murmurs, rubs, gallops RESPIRATORY:  Clear to auscultation without rales, wheezing or rhonchi  ABDOMEN: Soft, non-tender, non-distended MUSCULOSKELETAL:  No edema; No deformity  SKIN: Warm and dry NEUROLOGIC:  Alert and oriented x 3 PSYCHIATRIC:  Normal affect   ASSESSMENT:    No diagnosis found. PLAN:    In order of problems listed above:  ***      {Are you  ordering a CV Procedure (e.g. stress test, cath, DCCV, TEE, etc)?   Press F2        :[id number]}    Medication Adjustments/Labs and Tests Ordered: Current medicines are reviewed at length with the patient today.  Concerns regarding medicines are outlined above.  No orders of the defined types were placed in this encounter.  No orders of the defined types were placed in this encounter.   There are no Patient Instructions on file for this visit.   Hilbert Corrigan, [place]  08/23/2021 2:51  PM    [facility]

## 2021-08-24 ENCOUNTER — Telehealth (HOSPITAL_COMMUNITY): Payer: Self-pay | Admitting: *Deleted

## 2021-08-24 NOTE — Telephone Encounter (Signed)
Reaching out to patient to offer assistance regarding upcoming cardiac imaging study; pt verbalizes understanding of appt date/time, parking situation and where to check in, pre-test NPO status and medications ordered, and verified current allergies; name and call back number provided for further questions should they arise  Stephen Clement RN Navigator Cardiac Imaging [facility] [phone] office [phone] cell  Patient to take '100mg'$  metoprolol tartrate two hours prior to his cardiac CT scan.  He is aware to arrive at 10am.

## 2021-08-25 ENCOUNTER — Encounter: Payer: Self-pay | Admitting: Physician Assistant

## 2021-08-28 ENCOUNTER — Ambulatory Visit (HOSPITAL_COMMUNITY)
Admission: RE | Admit: 2021-08-28 | Discharge: 2021-08-28 | Disposition: A | Discharge status: Home / Self Care | Payer: Medicare Other | Source: Ambulatory Visit | Attending: Physician Assistant | Admitting: Physician Assistant

## 2021-08-28 DIAGNOSIS — R072 Precordial pain: Secondary | ICD-10-CM | POA: Diagnosis present

## 2021-08-28 MED ORDER — NITROGLYCERIN 0.4 MG SL SUBL
0.8000 mg | SUBLINGUAL_TABLET | Freq: Once | SUBLINGUAL | Status: AC
  Administered 2021-08-28: 0.8 mg via SUBLINGUAL

## 2021-08-28 MED ORDER — IOHEXOL 350 MG/ML SOLN
100.0000 mL | Freq: Once | INTRAVENOUS | Status: AC | PRN
Start: 2021-08-28 — End: 2021-08-28
  Administered 2021-08-28: 100 mL via INTRAVENOUS

## 2021-09-04 ENCOUNTER — Ambulatory Visit (INDEPENDENT_AMBULATORY_CARE_PROVIDER_SITE_OTHER): Payer: Medicare Other

## 2021-09-04 DIAGNOSIS — R072 Precordial pain: Secondary | ICD-10-CM | POA: Diagnosis not present

## 2021-09-04 LAB — ECHOCARDIOGRAM COMPLETE
Area-P 1/2: 5.38 cm2
Calc EF: 47 %
MV M vel: 3.47 m/s
MV Peak grad: 48.2 mmHg
S' Lateral: 3.41 cm
Single Plane A2C EF: 45.9 %
Single Plane A4C EF: 48.5 %

## 2021-09-11 ENCOUNTER — Encounter: Payer: Self-pay | Admitting: Physician Assistant

## 2021-09-11 ENCOUNTER — Ambulatory Visit (INDEPENDENT_AMBULATORY_CARE_PROVIDER_SITE_OTHER): Payer: Medicare Other | Admitting: Physician Assistant

## 2021-09-11 VITALS — BP 146/78 | HR 93 | Ht 69.0 in | Wt 248.2 lb

## 2021-09-11 DIAGNOSIS — I2510 Atherosclerotic heart disease of native coronary artery without angina pectoris: Secondary | ICD-10-CM

## 2021-09-11 DIAGNOSIS — I429 Cardiomyopathy, unspecified: Secondary | ICD-10-CM

## 2021-09-11 DIAGNOSIS — E785 Hyperlipidemia, unspecified: Secondary | ICD-10-CM

## 2021-09-11 MED ORDER — CARVEDILOL 3.125 MG PO TABS: 3.1250 mg | ORAL_TABLET | Freq: Two times a day (BID) | ORAL | 5 refills | Status: DC

## 2021-09-11 MED ORDER — ENTRESTO 24-26 MG PO TABS: 1.0000 | ORAL_TABLET | Freq: Two times a day (BID) | ORAL | 1 refills | Status: DC

## 2021-09-11 NOTE — Progress Notes (Signed)
Cardiology Office Note:    Date:  09/14/2021   ID:  Stephen Walton, DOB [birth year], MRN [id number]  PCP:  Ginger Organ   [facility] Providers Cardiologist:  Shelva Majestic, MD     Referring MD: Cory Munch, PA-C   Chief Complaint  Patient presents with   Follow-up    Discussed echocardiogram results.    History of Present Illness:    Stephen Walton is a 66 y.o. male with a hx of OSA on CPAP, obesity, hyperlipidemia, and history of chest pain with negative work-up.  Patient previously had chest pain shortly after his brother had cardiac arrest.  Echocardiogram obtained at the time showed moderate LVH with normal systolic and diastolic function.  Myoview obtained in July 2013 showed normal perfusion.   I last saw the patient in December 2021 at which time he had retired however remains very active, he was doing well at the time.  Lipid panel showed very well-controlled cholesterol.  More recently I saw the patient on 08/23/2021 for precordial chest pain.  Patient had a prolonged episode of chest pain 4 days prior to office visit that lasted an entire day.  We obtained a coronary CT that showed mild disease.  Echocardiogram however shows EF is borderline low.  Patient presents today to discuss the echocardiogram result.  We reviewed both recent coronary CT and echocardiogram.  He has not had any significant chest discomfort since.  He occasionally has a very atypical chest discomfort that occurs more so with emotional stress rather than physical exertion.  We discussed options including cardiac catheterization versus medical therapy.  Patient wished to proceed with a trial of medical therapy and repeat echocardiogram in 3 months.  I will start him on low-dose carvedilol and Entresto.  I plan to bring the patient back in a few weeks for up titration of Entresto and carvedilol.  Once I finish titrating his medication, I will plan for repeat echocardiogram in a few months prior  to his next follow-up with Dr. Claiborne Billings.  If ejection fraction is still low, may consider cardiac catheterization at that time for definitive evaluation.    Past Medical History:  Diagnosis Date   Chest pain, atypical    H/O echocardiogram 10/04/2011   EF >55%;mild concentric LVH; normals systolic & diastolic dysfunction; mild aortic stenosis   History of nuclear stress test 10/04/2011   low risk; small amount of basal inferior bowel artifact   Hyperlipidemia    OSA on CPAP     Past Surgical History:  Procedure Laterality Date   COLONOSCOPY N/A 11/14/2020   Procedure: COLONOSCOPY;  Surgeon: Aviva Signs, MD;  [facility];  Service: Gastroenterology;  Laterality: N/A;    Current Medications: Current Meds  Medication Sig   alfuzosin (UROXATRAL) 10 MG 24 hr tablet Take 1 tablet (10 mg total) by mouth in the morning and at bedtime.   aspirin 81 MG tablet Take 81 mg by mouth daily.   buPROPion (WELLBUTRIN XL) 300 MG 24 hr tablet Take 300 mg by mouth daily.   busPIRone (BUSPAR) 15 MG tablet Take 15 mg by mouth 2 (two) times daily.   carvedilol (COREG) 3.125 MG tablet Take 1 tablet (3.125 mg total) by mouth 2 (two) times daily.   fish oil-omega-3 fatty acids 1000 MG capsule Take 1 g by mouth daily.   Liraglutide -Weight Management (SAXENDA) 18 MG/3ML SOPN Inject 3 mg as directed daily.   NON FORMULARY at bedtime. CPAP  rosuvastatin (CRESTOR) 20 MG tablet Take 1 tablet (20 mg total) by mouth daily.   sacubitril-valsartan (ENTRESTO) 24-26 MG Take 1 tablet by mouth 2 (two) times daily.   testosterone cypionate (DEPOTESTOSTERONE CYPIONATE) 200 MG/ML injection Inject 0.5 mLs (100 mg total) into the muscle every 7 (seven) days.     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  Vaping Use   Vaping Use: Never used  Substance and Sexual Activity   Alcohol use: Yes    Comment: 1-2 beers per month   Drug use: Never   Sexual activity: Not on file  Other Topics Concern   Not on file  Social History Narrative   Not on file   Social Determinants of Health   Financial Resource Strain: Not on file  Food Insecurity: Not on file  Transportation Needs: Not on file  Physical Activity: Not on file  Stress: Not on file  Social Connections: Not on file     Family History: The patient's family history includes Heart attack in his brother.  ROS:   Please see the history of present illness.     All other systems reviewed and are negative.  EKGs/Labs/Other Studies Reviewed:    The following studies were reviewed today:  Coronary CT 08/28/2021 IMPRESSION: 1. Coronary calcium score of 70.6. This was 47th percentile for age-, sex, and race-matched controls.   2.  Normal coronary origin with right dominance.   3.  Mild atherosclerosis of the LAD and LCx.  CAD RADS 2.   4.  Recommend preventive therapy and risk factor modification.     Echo 09/04/2021 1. Left ventricular ejection fraction, by estimation, is 40 to 45%. The  left ventricle has mildly decreased function. The left ventricle has no  regional wall motion abnormalities. There is mild left ventricular  hypertrophy. Left ventricular diastolic  parameters were normal. The average left ventricular global longitudinal  strain is -13.7 %. The global longitudinal strain is abnormal.   2. Right ventricular systolic function is normal. The right ventricular  size is normal.   3. The mitral valve is normal in structure. Trivial mitral valve  regurgitation.   4. The aortic valve is normal in structure. Aortic valve regurgitation is  not  visualized. No aortic stenosis is present.   5. The inferior vena cava is normal in size with greater than 50%  respiratory variability, suggesting right atrial pressure of 3 mmHg.   EKG:  EKG is not ordered today.    Recent Labs: 07/04/2021: Hemoglobin 12.7  Recent Lipid Panel    Component Value Date/Time   CHOL 110 05/05/2020 1209   TRIG 77 05/05/2020 1209   HDL 43 05/05/2020 1209   CHOLHDL 2.6 05/05/2020 1209   LDLCALC 51 05/05/2020 1209     Risk Assessment/Calculations:           Physical Exam:    VS:  BP (!) 146/78 (BP Location: Left Arm, Patient Position: Sitting, Cuff Size: Large)   Pulse 93   Ht '5\' 9"'$  (1.753  m)   Wt 248 lb 3.2 oz (112.6 kg)   SpO2 96%   BMI 36.65 kg/m     Wt Readings from Last 3 Encounters:  09/11/21 248 lb 3.2 oz (112.6 kg)  08/23/21 247 lb 12.8 oz (112.4 kg)  11/07/20 249 lb (112.9 kg)     GEN:  Well nourished, well developed in no acute distress HEENT: Normal NECK: No JVD; No carotid bruits LYMPHATICS: No lymphadenopathy CARDIAC: RRR, no murmurs, rubs, gallops RESPIRATORY:  Clear to auscultation without rales, wheezing or rhonchi  ABDOMEN: Soft, non-tender, non-distended MUSCULOSKELETAL:  No edema; No deformity  SKIN: Warm and dry NEUROLOGIC:  Alert and oriented x 3 PSYCHIATRIC:  Normal affect   ASSESSMENT:    1. Cardiomyopathy, unspecified type ([place])   2. Coronary artery disease involving native coronary artery of native heart without angina pectoris   3. Hyperlipidemia LDL goal <70    PLAN:    In order of problems listed above:  Dilated cardiomyopathy: Recent echocardiogram shows EF 40%.  We will add carvedilol and Entresto to medical regimen.  Uptitrate heart failure medication on the next visit, and repeat echocardiogram in 3 months.  If EF remains low, may consider cardiac catheterization.  CAD: Patient was recently examined for prolonged episode of chest pain prior to the last office visit.  Coronary CT showed mild  disease, however echocardiogram shows low EF of 40 to 45%.  We discussed various options today including cardiac catheterization versus medical therapy.  Patient opted to proceed with medical therapy.  We will add carvedilol and Entresto.  Plan to uptitrate heart failure regimen on the next office visit prior to repeating echocardiogram in 3 months  Hyperlipidemia: LDL goal less than 70.  On Crestor and fish oil.           Medication Adjustments/Labs and Tests Ordered: Current medicines are reviewed at length with the patient today.  Concerns regarding medicines are outlined above.  No orders of the defined types were placed in this encounter.  Meds ordered this encounter  Medications   carvedilol (COREG) 3.125 MG tablet    Sig: Take 1 tablet (3.125 mg total) by mouth 2 (two) times daily.    Dispense:  60 tablet    Refill:  5   sacubitril-valsartan (ENTRESTO) 24-26 MG    Sig: Take 1 tablet by mouth 2 (two) times daily.    Dispense:  60 tablet    Refill:  1    Patient Instructions  Medication Instructions:  START Carvedilol (Coreg) 3.125 mg 2 times a day START Entresto 24 mg-26 mg 1 tablet 2 times a day   *If you need a refill on your cardiac medications before your next appointment, please call your pharmacy*  Lab Work: Have your PCP fax a copy of your BMP labs to our office when you have your annual physical. Fax number is [phone] If you have labs (blood work) drawn today and your tests are completely normal, you will receive your results only by: [place] (if you have MyChart) OR A paper copy in the mail If you have any lab test that is abnormal or we need to change your treatment, we will call you to review the results.  Testing/Procedures: NONE ordered at this time of appointment   Follow-Up: At [facility], you and your health needs are our priority.  As part of our continuing mission to provide you with exceptional heart care, we have created designated  Provider Care Teams.  These Care  Teams include your primary Cardiologist (physician) and Advanced Practice Providers (APPs -  Physician Assistants and Nurse Practitioners) who all work together to provide you with the care you need, when you need it.  Your next appointment:   3-4 week(s) 4 month(s)  The format for your next appointment:   In Person In Person   Provider:   Almyra Deforest, PA-C Shelva Majestic, MD     Other Instructions   Important Information About Sugar         Signed, Almyra Deforest, [place]  09/14/2021 12:01 AM    [place]

## 2021-09-14 ENCOUNTER — Encounter: Payer: Self-pay | Admitting: Physician Assistant

## 2021-09-20 ENCOUNTER — Ambulatory Visit: Payer: Medicare Other | Admitting: Physician Assistant

## 2021-10-01 ENCOUNTER — Encounter: Payer: Self-pay | Admitting: Physician Assistant

## 2021-10-01 ENCOUNTER — Ambulatory Visit (INDEPENDENT_AMBULATORY_CARE_PROVIDER_SITE_OTHER): Payer: Medicare Other | Admitting: Physician Assistant

## 2021-10-01 VITALS — BP 124/76 | HR 65 | Ht 69.0 in | Wt 248.0 lb

## 2021-10-01 DIAGNOSIS — I428 Other cardiomyopathies: Secondary | ICD-10-CM | POA: Diagnosis not present

## 2021-10-01 DIAGNOSIS — I2510 Atherosclerotic heart disease of native coronary artery without angina pectoris: Secondary | ICD-10-CM

## 2021-10-01 NOTE — Patient Instructions (Signed)
Medication Instructions:  Your physician recommends that you continue on your current medications as directed. Please refer to the Current Medication list given to you today.  *If you need a refill on your cardiac medications before your next appointment, please call your pharmacy*   Lab Work: NONE If you have labs (blood work) drawn today and your tests are completely normal, you will receive your results only by: [place] (if you have MyChart) OR A paper copy in the mail If you have any lab test that is abnormal or we need to change your treatment, we will call you to review the results.   Testing/Procedures: NONE   Follow-Up: At [facility], you and your health needs are our priority.  As part of our continuing mission to provide you with exceptional heart care, we have created designated Provider Care Teams.  These Care Teams include your primary Cardiologist (physician) and Advanced Practice Providers (APPs -  Physician Assistants and Nurse Practitioners) who all work together to provide you with the care you need, when you need it.  We recommend signing up for the patient portal called "MyChart".  Sign up information is provided on this After Visit Summary.  MyChart is used to connect with patients for Virtual Visits (Telemedicine).  Patients are able to view lab/test results, encounter notes, upcoming appointments, etc.  Non-urgent messages can be sent to your provider as well.   To learn more about what you can do with MyChart, go to [email or web address].    Your next appointment:   6 week(s)  The format for your next appointment:   In Person  Provider:   Almyra Deforest, PA-C    {

## 2021-10-01 NOTE — Progress Notes (Unsigned)
Cardiology Office Note:    Date:  10/03/2021   ID:  Stephen Walton, DOB 1955/12/27, MRN [id number]  PCP:  Mann, Benjamin L, [facility] Providers Cardiologist:  Shelva Majestic, MD     Referring MD: Cory Munch, PA-C   Chief Complaint  Patient presents with   Follow-up    Seen for Dr. Claiborne Billings    History of Present Illness:    Stephen Walton is a 66 y.o. male with a hx of OSA on CPAP, obesity, hyperlipidemia, and history of chest pain with negative work-up.  Patient previously had chest pain shortly after his brother had cardiac arrest.  Echocardiogram obtained at the time showed moderate LVH with normal systolic and diastolic function.  Myoview obtained in July 2013 showed normal perfusion.  I saw the patient on 08/23/2021 for precordial chest pain.  He had prolonged chest pain 4 days prior to the office visit that lasted entire day.  We obtained a coronary CT that showed mild CAD.  Echocardiogram however showed EF was borderline low.  I discussed with the patient regarding medical therapy versus definitive cardiac catheterization.  We eventually decided on a trial of medical therapy first.  I started him on carvedilol and Entresto.  He presents back today for up titration of heart failure therapy.  Patient presents today for follow-up.  Unfortunately a 30-day supply of Delene Loll will cost him $182, we will initiate medication assistance program.  If he does not qualify for medication assistance program, we will discontinue Entresto and switch to valsartan and potentially add spironolactone.  Recent blood work obtained by PCP showed stable renal function, potassium borderline high at 4.8.  He is tolerating the current 24-26 mg twice a day of Entresto, and wanted to increase his Entresto to 49-51 mg twice a day, however this will be after he get approved for the medication assistance program.  Otherwise, he appears to be euvolemic on physical exam.  I plan to see the  patient back in 6 weeks.  Once I finish titrating his medication, I will order echocardiogram prior to his follow-up with Dr. Claiborne Billings in November.  Past Medical History:  Diagnosis Date   Chest pain, atypical    H/O echocardiogram 10/04/2011   EF >55%;mild concentric LVH; normals systolic & diastolic dysfunction; mild aortic stenosis   History of nuclear stress test 10/04/2011   low risk; small amount of basal inferior bowel artifact   Hyperlipidemia    OSA on CPAP     Past Surgical History:  Procedure Laterality Date   COLONOSCOPY N/A 11/14/2020   Procedure: COLONOSCOPY;  Surgeon: Aviva Signs, MD;  [facility];  Service: Gastroenterology;  Laterality: N/A;    Current Medications: Current Meds  Medication Sig   alfuzosin (UROXATRAL) 10 MG 24 hr tablet Take 1 tablet (10 mg total) by mouth in the morning and at bedtime.   aspirin 81 MG tablet Take 81 mg by mouth daily.   buPROPion (WELLBUTRIN XL) 300 MG 24 hr tablet Take 300 mg by mouth daily.   busPIRone (BUSPAR) 15 MG tablet Take 15 mg by mouth 2 (two) times daily.   carvedilol (COREG) 3.125 MG tablet Take 1 tablet (3.125 mg total) by mouth 2 (two) times daily.   fish oil-omega-3 fatty acids 1000 MG capsule Take 1 g by mouth daily.   Liraglutide -Weight Management (SAXENDA) 18 MG/3ML SOPN Inject 3 mg as directed daily.   nitroGLYCERIN (NITROSTAT) 0.4 MG SL tablet  Place 1 tablet (0.4 mg total) under the tongue every 5 (five) minutes as needed for chest pain.   NON FORMULARY at bedtime. CPAP   rosuvastatin (CRESTOR) 20 MG tablet Take 1 tablet (20 mg total) by mouth daily.   sacubitril-valsartan (ENTRESTO) 24-26 MG Take 1 tablet by mouth 2 (two) times daily.   testosterone cypionate (DEPOTESTOSTERONE CYPIONATE) 200 MG/ML injection Inject 0.5 mLs (100 mg total) into the muscle every 7 (seven) days.     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  Vaping Use   Vaping Use: Never used  Substance and Sexual Activity   Alcohol use: Yes    Comment: 1-2 beers per month   Drug use: Never   Sexual activity: Not on file  Other Topics Concern   Not on file  Social History Narrative   Not on file   Social Determinants of Health   Financial Resource Strain: Not on file  Food Insecurity: Not on file  Transportation Needs: Not on file  Physical Activity: Not on file  Stress: Not on file  Social Connections: Not on file     Family History: The patient's family history includes Heart attack in his brother.  ROS:   Please see the history of present illness.     All other systems reviewed and are negative.  EKGs/Labs/Other Studies Reviewed:    The following studies were reviewed today:  Echo 09/04/2021  1. Left ventricular ejection fraction, by estimation, is 40 to 45%. The  left ventricle has mildly decreased function. The left ventricle has no  regional wall motion abnormalities. There is mild left ventricular  hypertrophy. Left ventricular diastolic  parameters were normal. The average left ventricular global longitudinal  strain is -13.7 %. The global longitudinal strain is abnormal.   2. Right ventricular systolic function is normal. The right ventricular  size is normal.   3. The mitral valve is normal in structure. Trivial mitral valve  regurgitation.   4. The aortic valve is normal in structure. Aortic valve regurgitation is  not visualized. No aortic stenosis is present.   5. The inferior vena cava is normal in size with greater than 50%  respiratory variability, suggesting right atrial pressure of 3 mmHg.   EKG:  EKG is not  ordered today.    Recent Labs: 07/04/2021: Hemoglobin 12.7  Recent Lipid Panel    Component Value Date/Time   CHOL 110 05/05/2020 1209   TRIG 77 05/05/2020 1209   HDL 43 05/05/2020 1209   CHOLHDL 2.6 05/05/2020 1209   LDLCALC 51 05/05/2020 1209     Risk Assessment/Calculations:           Physical Exam:    VS:  BP 124/76   Pulse 65   Ht '5\' 9"'$  (1.753 m)   Wt 248 lb (112.5 kg)   SpO2 97%   BMI 36.62 kg/m        Wt Readings from Last 3 Encounters:  10/01/21 248 lb (112.5 kg)  09/11/21 248 lb 3.2 oz (112.6  kg)  08/23/21 247 lb 12.8 oz (112.4 kg)     GEN:  Well nourished, well developed in no acute distress HEENT: Normal NECK: No JVD; No carotid bruits LYMPHATICS: No lymphadenopathy CARDIAC: RRR, no murmurs, rubs, gallops RESPIRATORY:  Clear to auscultation without rales, wheezing or rhonchi  ABDOMEN: Soft, non-tender, non-distended MUSCULOSKELETAL:  No edema; No deformity  SKIN: Warm and dry NEUROLOGIC:  Alert and oriented x 3 PSYCHIATRIC:  Normal affect   ASSESSMENT:    1. NICM (nonischemic cardiomyopathy) ([place])   2. 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  Coronary CT showed nonobstructive disease.  I would like to continue to [employer], however this medication is too expensive for the patient.  We will initiate medication assistance program.  If he does not qualify, I likely will remove Entresto and switch to valsartan and potentially add either spironolactone or Farxiga to his medical regimen.  I plan to see the patient back in 6 weeks, at which time I likely will order repeat echocardiogram prior to his visit with Dr. Claiborne Billings.  CAD: Nonobstructive disease on coronary CT             Medication Adjustments/Labs and Tests Ordered: Current medicines are reviewed at length with  the patient today.  Concerns regarding medicines are outlined above.  No orders of the defined types were placed in this encounter.  No orders of the defined types were placed in this encounter.   Patient Instructions  Medication Instructions:  Your physician recommends that you continue on your current medications as directed. Please refer to the Current Medication list given to you today.  *If you need a refill on your cardiac medications before your next appointment, please call your pharmacy*   Lab Work: NONE If you have labs (blood work) drawn today and your tests are completely normal, you will receive your results only by: [place] (if you have MyChart) OR A paper copy in the mail If you have any lab test that is abnormal or we need to change your treatment, we will call you to review the results.   Testing/Procedures: NONE   Follow-Up: At [facility], you and your health needs are our priority.  As part of our continuing mission to provide you with exceptional heart care, we have created designated Provider Care Teams.  These Care Teams include your primary Cardiologist (physician) and Advanced Practice Providers (APPs -  Physician Assistants and Nurse Practitioners) who all work together to provide you with the care you need, when you need it.  We recommend signing up for the patient portal called "MyChart".  Sign up information is provided on this After Visit Summary.  MyChart is used to connect with patients for Virtual Visits (Telemedicine).  Patients are able to view lab/test results, encounter notes, upcoming appointments, etc.  Non-urgent messages can be sent to your provider as well.   To learn more about what you can do with MyChart, go to [email or web address].    Your next appointment:   6 week(s)  The format for your next appointment:   In Person  Provider:   Almyra Deforest, PA-C    {    Signed, Almyra Deforest, [place]  10/03/2021 3:33 PM    [place]

## 2021-10-03 ENCOUNTER — Encounter: Payer: Self-pay | Admitting: Physician Assistant

## 2021-10-16 DIAGNOSIS — I2510 Atherosclerotic heart disease of native coronary artery without angina pectoris: Secondary | ICD-10-CM

## 2021-10-16 HISTORY — DX: Atherosclerotic heart disease of native coronary artery without angina pectoris: I25.10

## 2021-10-20 ENCOUNTER — Emergency Department (HOSPITAL_COMMUNITY): Payer: Medicare Other

## 2021-10-20 ENCOUNTER — Observation Stay (HOSPITAL_COMMUNITY)
Admission: EM | Admit: 2021-10-20 | Discharge: 2021-10-21 | Disposition: A | Discharge status: Home / Self Care | Payer: Medicare Other | Attending: Internal Medicine | Admitting: Internal Medicine

## 2021-10-20 ENCOUNTER — Other Ambulatory Visit: Payer: Self-pay

## 2021-10-20 ENCOUNTER — Encounter (HOSPITAL_COMMUNITY): Payer: Self-pay

## 2021-10-20 DIAGNOSIS — I5020 Unspecified systolic (congestive) heart failure: Secondary | ICD-10-CM | POA: Diagnosis present

## 2021-10-20 DIAGNOSIS — F32A Depression, unspecified: Secondary | ICD-10-CM

## 2021-10-20 DIAGNOSIS — I10 Essential (primary) hypertension: Secondary | ICD-10-CM | POA: Diagnosis not present

## 2021-10-20 DIAGNOSIS — Z79899 Other long term (current) drug therapy: Secondary | ICD-10-CM | POA: Diagnosis not present

## 2021-10-20 DIAGNOSIS — R079 Chest pain, unspecified: Secondary | ICD-10-CM | POA: Diagnosis present

## 2021-10-20 DIAGNOSIS — E785 Hyperlipidemia, unspecified: Secondary | ICD-10-CM

## 2021-10-20 DIAGNOSIS — Z7982 Long term (current) use of aspirin: Secondary | ICD-10-CM | POA: Insufficient documentation

## 2021-10-20 DIAGNOSIS — R0789 Other chest pain: Secondary | ICD-10-CM | POA: Diagnosis present

## 2021-10-20 DIAGNOSIS — G4733 Obstructive sleep apnea (adult) (pediatric): Secondary | ICD-10-CM

## 2021-10-20 DIAGNOSIS — I209 Angina pectoris, unspecified: Principal | ICD-10-CM | POA: Insufficient documentation

## 2021-10-20 DIAGNOSIS — I110 Hypertensive heart disease with heart failure: Secondary | ICD-10-CM | POA: Diagnosis not present

## 2021-10-20 DIAGNOSIS — I5022 Chronic systolic (congestive) heart failure: Secondary | ICD-10-CM | POA: Insufficient documentation

## 2021-10-20 DIAGNOSIS — Z87891 Personal history of nicotine dependence: Secondary | ICD-10-CM | POA: Diagnosis not present

## 2021-10-20 DIAGNOSIS — N400 Enlarged prostate without lower urinary tract symptoms: Secondary | ICD-10-CM | POA: Diagnosis not present

## 2021-10-20 DIAGNOSIS — Z9989 Dependence on other enabling machines and devices: Secondary | ICD-10-CM

## 2021-10-20 DIAGNOSIS — K219 Gastro-esophageal reflux disease without esophagitis: Secondary | ICD-10-CM

## 2021-10-20 LAB — CBC WITH DIFFERENTIAL/PLATELET
Abs Immature Granulocytes: 0.02 10*3/uL (ref 0.00–0.07)
Basophils Absolute: 0.1 10*3/uL (ref 0.0–0.1)
Basophils Relative: 1 %
Eosinophils Absolute: 0.2 10*3/uL (ref 0.0–0.5)
Eosinophils Relative: 4 %
HCT: 46.7 % (ref 39.0–52.0)
Hemoglobin: 14.1 g/dL (ref 13.0–17.0)
Immature Granulocytes: 0 %
Lymphocytes Relative: 19 %
Lymphs Abs: 1.2 10*3/uL (ref 0.7–4.0)
MCH: 21.8 pg — ABNORMAL LOW (ref 26.0–34.0)
MCHC: 30.2 g/dL (ref 30.0–36.0)
MCV: 72.3 fL — ABNORMAL LOW (ref 80.0–100.0)
Monocytes Absolute: 1 10*3/uL (ref 0.1–1.0)
Monocytes Relative: 16 %
Neutro Abs: 3.7 10*3/uL (ref 1.7–7.7)
Neutrophils Relative %: 60 %
Platelets: 314 10*3/uL (ref 150–400)
RBC: 6.46 MIL/uL — ABNORMAL HIGH (ref 4.22–5.81)
RDW: 21.5 % — ABNORMAL HIGH (ref 11.5–15.5)
WBC: 6.2 10*3/uL (ref 4.0–10.5)
nRBC: 0 % (ref 0.0–0.2)

## 2021-10-20 LAB — COMPREHENSIVE METABOLIC PANEL
ALT: 21 U/L (ref 0–44)
AST: 24 U/L (ref 15–41)
Albumin: 4 g/dL (ref 3.5–5.0)
Alkaline Phosphatase: 68 U/L (ref 38–126)
Anion gap: 5 (ref 5–15)
BUN: 18 mg/dL (ref 8–23)
CO2: 24 mmol/L (ref 22–32)
Calcium: 8.8 mg/dL — ABNORMAL LOW (ref 8.9–10.3)
Chloride: 105 mmol/L (ref 98–111)
Creatinine, Ser: 1 mg/dL (ref 0.61–1.24)
GFR, Estimated: >60 mL/min (ref >60)
Glucose, Bld: 114 mg/dL — ABNORMAL HIGH (ref 70–99)
Potassium: 4.5 mmol/L (ref 3.5–5.1)
Sodium: 134 mmol/L — ABNORMAL LOW (ref 135–145)
Total Bilirubin: 0.5 mg/dL (ref 0.3–1.2)
Total Protein: 7.3 g/dL (ref 6.5–8.1)

## 2021-10-20 LAB — HIV ANTIBODY (ROUTINE TESTING W REFLEX): HIV Screen 4th Generation wRfx: NONREACTIVE

## 2021-10-20 LAB — D-DIMER, QUANTITATIVE: D-Dimer, Quant: 0.31 ug/mL-FEU (ref 0.00–0.50)

## 2021-10-20 LAB — TROPONIN I (HIGH SENSITIVITY)
Troponin I (High Sensitivity): 5 ng/L (ref <18)
Troponin I (High Sensitivity): 4 ng/L (ref <18)

## 2021-10-20 MED ORDER — ROSUVASTATIN CALCIUM 20 MG PO TABS
20.0000 mg | ORAL_TABLET | Freq: Every day | ORAL | Status: DC
  Administered 2021-10-20 – 2021-10-21 (×2): 20 mg via ORAL
  Filled 2021-10-20 (×2): qty 1

## 2021-10-20 MED ORDER — PANTOPRAZOLE SODIUM 40 MG PO TBEC
40.0000 mg | DELAYED_RELEASE_TABLET | Freq: Every day | ORAL | Status: DC
Start: 2021-10-20 — End: 2021-10-21
  Administered 2021-10-20 – 2021-10-21 (×2): 40 mg via ORAL
  Filled 2021-10-20 (×2): qty 1

## 2021-10-20 MED ORDER — NITROGLYCERIN 0.4 MG SL SUBL
0.4000 mg | SUBLINGUAL_TABLET | Freq: Once | SUBLINGUAL | Status: AC
  Administered 2021-10-20: 0.4 mg via SUBLINGUAL
  Filled 2021-10-20: qty 1

## 2021-10-20 MED ORDER — NITROGLYCERIN 0.4 MG SL SUBL
0.4000 mg | SUBLINGUAL_TABLET | SUBLINGUAL | Status: DC | PRN
  Administered 2021-10-20: 0.4 mg via SUBLINGUAL
  Filled 2021-10-20: qty 1

## 2021-10-20 MED ORDER — ONDANSETRON HCL 4 MG/2ML IJ SOLN: 4.0000 mg | Freq: Four times a day (QID) | INTRAMUSCULAR | Status: DC | PRN

## 2021-10-20 MED ORDER — ALFUZOSIN HCL ER 10 MG PO TB24
10.0000 mg | ORAL_TABLET | Freq: Every day | ORAL | Status: DC
  Filled 2021-10-20: qty 1

## 2021-10-20 MED ORDER — ISOSORBIDE MONONITRATE ER 30 MG PO TB24
30.0000 mg | ORAL_TABLET | Freq: Every day | ORAL | Status: DC
Start: 2021-10-20 — End: 2021-10-21
  Administered 2021-10-20 – 2021-10-21 (×2): 30 mg via ORAL
  Filled 2021-10-20 (×2): qty 1

## 2021-10-20 MED ORDER — SACUBITRIL-VALSARTAN 24-26 MG PO TABS
1.0000 | ORAL_TABLET | Freq: Two times a day (BID) | ORAL | Status: DC
  Administered 2021-10-20 – 2021-10-21 (×3): 1 via ORAL
  Filled 2021-10-20 (×3): qty 1

## 2021-10-20 MED ORDER — BUSPIRONE HCL 5 MG PO TABS
15.0000 mg | ORAL_TABLET | Freq: Two times a day (BID) | ORAL | Status: DC
  Administered 2021-10-20 – 2021-10-21 (×3): 15 mg via ORAL
  Filled 2021-10-20 (×3): qty 3

## 2021-10-20 MED ORDER — OMEGA-3-ACID ETHYL ESTERS 1 G PO CAPS
1.0000 g | ORAL_CAPSULE | Freq: Two times a day (BID) | ORAL | Status: DC
Start: 2021-10-20 — End: 2021-10-21
  Administered 2021-10-20 – 2021-10-21 (×3): 1 g via ORAL
  Filled 2021-10-20 (×3): qty 1

## 2021-10-20 MED ORDER — ACETAMINOPHEN 325 MG PO TABS: 650.0000 mg | ORAL_TABLET | ORAL | Status: DC | PRN

## 2021-10-20 MED ORDER — BUPROPION HCL ER (XL) 150 MG PO TB24
300.0000 mg | ORAL_TABLET | Freq: Every day | ORAL | Status: DC
Start: 2021-10-20 — End: 2021-10-21
  Administered 2021-10-20 – 2021-10-21 (×2): 300 mg via ORAL
  Filled 2021-10-20 (×2): qty 2

## 2021-10-20 MED ORDER — HEPARIN SODIUM (PORCINE) 5000 UNIT/ML IJ SOLN
5000.0000 [IU] | Freq: Three times a day (TID) | INTRAMUSCULAR | Status: DC
  Administered 2021-10-20 – 2021-10-21 (×3): 5000 [IU] via SUBCUTANEOUS
  Filled 2021-10-20 (×3): qty 1

## 2021-10-20 MED ORDER — CARVEDILOL 3.125 MG PO TABS
3.1250 mg | ORAL_TABLET | Freq: Two times a day (BID) | ORAL | Status: DC
  Administered 2021-10-20 – 2021-10-21 (×3): 3.125 mg via ORAL
  Filled 2021-10-20 (×3): qty 1

## 2021-10-20 MED ORDER — ASPIRIN 81 MG PO TBEC
81.0000 mg | DELAYED_RELEASE_TABLET | Freq: Every day | ORAL | Status: DC
  Administered 2021-10-20 – 2021-10-21 (×3): 81 mg via ORAL
  Filled 2021-10-20 (×3): qty 1

## 2021-10-20 MED ORDER — ALFUZOSIN HCL ER 10 MG PO TB24
10.0000 mg | ORAL_TABLET | Freq: Every day | ORAL | Status: DC
Start: 2021-10-20 — End: 2021-10-21
  Administered 2021-10-20: 10 mg via ORAL
  Filled 2021-10-20: qty 1

## 2021-10-20 NOTE — Assessment & Plan Note (Signed)
Continue statin. 

## 2021-10-20 NOTE — Assessment & Plan Note (Signed)
-   Stable overall -Continue treatment with carvedilol and Entresto. -Heart healthy diet discussed with patient.

## 2021-10-20 NOTE — Progress Notes (Signed)
When patient arrived from [facility] via [facility], pt walked to bed and complained of 5/10 CP, sore cramp. BP 154/90, HR 86 - 1 SL nitro given at 1341. At 1346, CP down to a 2/10, BP 123/78, HR 96. Pt denied a 2nd nitro. MD [facility] notified. MD Margaretann Loveless paged.

## 2021-10-20 NOTE — Progress Notes (Signed)
Pt has home CPAP, no assistance needed.  

## 2021-10-20 NOTE — ED Notes (Signed)
Pt stated he recently went to the doctor and had blood drawn, stated they called him and told him he was anemic; However, they have not called him back on an update for which type of anemia

## 2021-10-20 NOTE — Assessment & Plan Note (Signed)
continue PPI

## 2021-10-20 NOTE — Assessment & Plan Note (Signed)
-   Heart healthy diet to be continued -Continue treatment with beta-blocker and Entresto -Appears compensated -Most recent echo demonstrating ejection fraction 40-45% -Follow daily weights and strict intake and output.

## 2021-10-20 NOTE — H&P (Signed)
History and Physical    Patient: Stephen Walton VFI:[id number] DOB: June 03, 1955 DOA: 10/20/2021 DOS: the patient was seen and examined on 10/20/2021 PCP: [facility]  Patient coming from: Home  Chief Complaint:  Chief Complaint  Patient presents with   Chest Pain   HPI: Stephen Walton is a 66 y.o. male with medical history significant of with past medical history significant for obstructive sleep apnea (on CPAP), hypertension, hyperlipidemia, gastroesophageal reflux disease, BPH, depression, chronic systolic heart failure; who presented to the emergency department secondary to chest pain.  Patient reports symptoms have been present for the last 36 hours continues and associated with mild shortness of breath.  Pain 5-7 out of 10 in intensity when present, no aggravating or relieving factors.  He reports to be compliant with his medication and expressed no productive cough, no sick contact, no dysuria, no hematuria, no melena, no hematochezia, no abdominal pain, no fever/chills and denies focal weakness.  Work-up in the ED demonstrated negative troponin; no acute ischemic changes appreciated on EKG.  Case discussed with cardiology service who has recommended transfer to [facility] for further evaluation and management.  [facility] contacted to assist placing patient in the hospital and facilitating transfer.  Review of Systems: As mentioned in the history of present illness. All other systems reviewed and are negative. Past Medical History:  Diagnosis Date   Chest pain, atypical    H/O echocardiogram 10/04/2011   EF >55%;mild concentric LVH; normals systolic & diastolic dysfunction; mild aortic stenosis   History of nuclear stress test 10/04/2011   low risk; small amount of basal inferior bowel artifact   Hyperlipidemia    OSA on CPAP    Past Surgical History:  Procedure Laterality Date   COLONOSCOPY N/A 11/14/2020   Procedure: COLONOSCOPY;  Surgeon: Aviva Signs,  MD;  [facility];  Service: Gastroenterology;  Laterality: N/A;   Social History:  reports that he has quit smoking. He has never used smokeless tobacco. He reports current alcohol use. He reports that he does not use drugs.  Allergies  Allergen Reactions   No Known Allergies     Family History  Problem Relation Age of Onset   Heart attack Brother     Prior to Admission medications   Medication Sig Start Date End Date Taking? Authorizing Provider  alfuzosin (UROXATRAL) 10 MG 24 hr tablet Take 1 tablet (10 mg total) by mouth in the morning and at bedtime. 07/11/21  Yes McKenzie, Candee Furbish, MD  aspirin 81 MG tablet Take 81 mg by mouth daily.   Yes [provider]  buPROPion (WELLBUTRIN XL) 300 MG 24 hr tablet Take 300 mg by mouth daily. 08/18/18  Yes [provider]  busPIRone (BUSPAR) 15 MG tablet Take 15 mg by mouth 2 (two) times daily.   Yes [provider]  carvedilol (COREG) 3.125 MG tablet Take 1 tablet (3.125 mg total) by mouth 2 (two) times daily. 09/11/21  Yes Meng, Isaac Laud, PA  fish oil-omega-3 fatty acids 1000 MG capsule Take 1 g by mouth daily.   Yes [provider]  Liraglutide -Weight Management (SAXENDA) 18 MG/3ML SOPN Inject 3 mg as directed daily. 07/14/18  Yes [provider]  nitroGLYCERIN (NITROSTAT) 0.4 MG SL tablet Place 1 tablet (0.4 mg total) under the tongue every 5 (five) minutes as needed for chest pain. 08/23/21  Yes Almyra Deforest, PA  rosuvastatin (CRESTOR) 20 MG tablet Take 1 tablet (20 mg total) by mouth daily.  08/23/21  Yes Troy Sine, MD  sacubitril-valsartan (ENTRESTO) 24-26 MG Take 1 tablet by mouth 2 (two) times daily. 09/11/21  Yes Almyra Deforest, PA  testosterone cypionate (DEPOTESTOSTERONE CYPIONATE) 200 MG/ML injection Inject 0.5 mLs (100 mg total) into the muscle every 7 (seven) days. 07/11/21  Yes McKenzie, Candee Furbish, MD  NON FORMULARY at bedtime. CPAP    [provider]    Physical Exam: Vitals:    10/20/21 1150 10/20/21 1200 10/20/21 1230 10/20/21 1341  BP: (!) 150/89 (!) 151/92 (!) 152/89 (!) 154/90  Pulse: 91 76 81 85  Resp: (!) '23 14 16 16  '$ Temp: 98.2 F (36.8 C)   98.6 F (37 C)  TempSrc: Oral   Oral  SpO2: 96% 99% 96% 95%   General exam: Alert, awake, oriented x 3; at time of examination was not complaining of chest pain.  Reports breathing comfortable and denies palpitation. Respiratory system: Good air movement bilaterally.  Good saturation on room air. Cardiovascular system:RRR. No rubs or gallops; no JVD on exam. Gastrointestinal system: Abdomen is nondistended, soft and nontender. No organomegaly or masses felt. Normal bowel sounds heard. Central nervous system: Alert and oriented. No focal neurological deficits. Extremities: No cyanosis, clubbing or edema. Skin: No petechiae. Psychiatry: Judgement and insight appear normal. Mood & affect appropriate.   Data Reviewed: CBC: WBC 6.2, hemoglobin 14.1, platelet count 314 K -Comprehensive metabolic panel: Sodium 301, potassium 4.5, chloride 105, bicarb 24, BUN 18, creatinine 1.0; normal LFTs. High sensitive troponin 5>>4 D-dimer 0.31 Chest x-ray time: Demonstrating no acute cardiopulmonary process.   Assessment and Plan: * Chest pain - Heart score of 4 -Atypical in presentation but with some typical features and responded to nitroglycerin. -Case discussed with cardiology service who has recommended transfer to [facility] for further evaluation and management. -Will keep n.p.o. after midnight; heart healthy diet to be continued at this point. -Continue aspirin, carvedilol, Entresto, statin and PPI. -As needed nitroglycerin has been ordered. -2D echo has not been ordered as patient with recent echocardiogram in June of 2023. -will let cardiology to dictate for evaluation/images.  Systolic HF (heart failure) (HCC) - Heart healthy diet to be continued -Continue treatment with beta-blocker and  Entresto -Appears compensated -Most recent echo demonstrating ejection fraction 40-45% -Follow daily weights and strict intake and output.  GERD (gastroesophageal reflux disease) -continue PPI   Benign essential HTN - Stable overall -Continue treatment with carvedilol and Entresto. -Heart healthy diet discussed with patient.  Depression - Overall stable mood -Continue the use of BuSpar and Wellbutrin.  BPH (benign prostatic hyperplasia) - Continue patient follow-up with urology -Continue treatment with alfuzosin. -No complaints of urinary retention currently.  Hyperlipidemia - Continue statin  Obstructive sleep apnea on CPAP - Continue CPAP nightly.    Advance Care Planning:   Code Status: Full Code   Consults: Cardiology service  Family Communication: No family at bedside.  Severity of Illness: The appropriate patient status for this patient is OBSERVATION. Observation status is judged to be reasonable and necessary in order to provide the required intensity of service to ensure the patient's safety. The patient's presenting symptoms, physical exam findings, and initial radiographic and laboratory data in the context of their medical condition is felt to place them at decreased risk for further clinical deterioration. Furthermore, it is anticipated that the patient will be medically stable for discharge from the hospital within 2 midnights of admission.   Author: Barton Dubois, MD 10/20/2021 1:47 PM  For on call  review [email or web address].

## 2021-10-20 NOTE — ED Triage Notes (Signed)
Pt arrived via POV w c/o chest pain x approx 36 hours. States it is a tightness around his chest.

## 2021-10-20 NOTE — Assessment & Plan Note (Signed)
-   Continue patient follow-up with urology -Continue treatment with alfuzosin. -No complaints of urinary retention currently.

## 2021-10-20 NOTE — Assessment & Plan Note (Signed)
Continue CPAP nightly. °

## 2021-10-20 NOTE — Assessment & Plan Note (Signed)
-   Heart score of 4 -Atypical in presentation but with some typical features and responded to nitroglycerin. -Case discussed with cardiology service who has recommended transfer to [facility] for further evaluation and management. -Will keep n.p.o. after midnight; heart healthy diet to be continued at this point. -Continue aspirin, carvedilol, Entresto, statin and PPI. -As needed nitroglycerin has been ordered. -2D echo has not been ordered as patient with recent echocardiogram in June of 2023. -will let cardiology to dictate for evaluation/images.

## 2021-10-20 NOTE — Consult Note (Addendum)
Cardiology Consultation:   Patient ID: Stephen Walton MRN: [id number]; DOB: [birth year]  Admit date: 10/20/2021 Date of Consult: 10/20/2021  PCP:  [place] [place] [facility] Cardiologist:  Shelva Majestic, MD        Patient Profile:   Stephen Walton is a 66 y.o. male with a hx of HTN, HLD, GERD, OSA on cpap who is being seen 10/20/2021 for the evaluation of Chest pain at the request of Dr. Roderic Palau.  History of Present Illness:   Stephen Walton is a pleasant 31 year old gentleman seen today with his family at the bedside for chest pain.  He has been following closely with Almyra Deforest, PA and is a patient of Dr. Claiborne Billings.  Almyra Deforest, [place] has been evaluating him for chest pain.  Echocardiogram was felt to have a mildly reduced ejection fraction and coronary CTA felt to have mild CAD.  I have independently reviewed both of these tests please see impressions for independent interpretation.  He has had episodic chest pain that improved after starting Entresto which was initiated for mildly reduced ejection fraction noted on echocardiogram.  He notes that his blood pressure increases with anxiety.  He has a brother who had cardiac arrest in his 83s, and consultation notes previously note chest pain shortly after this event.  Work-up at that time included a normal stress nuclear study and grossly normal echocardiogram with moderate LVH.  Normal EKG and normal review of telemetry Troponins are negative and laboratory studies are grossly normal aside from a mildly decreased sodium of 134.   Past Medical History:  Diagnosis Date   Chest pain, atypical    H/O echocardiogram 10/04/2011   EF >55%;mild concentric LVH; normals systolic & diastolic dysfunction; mild aortic stenosis   History of nuclear stress test 10/04/2011   low risk; small amount of basal inferior bowel artifact   Hyperlipidemia    OSA on CPAP     Past Surgical History:  Procedure Laterality Date    COLONOSCOPY N/A 11/14/2020   Procedure: COLONOSCOPY;  Surgeon: Aviva Signs, MD;  Location: [facility];  Service: Gastroenterology;  Laterality: N/A;     Home Medications:  Prior to Admission medications   Medication Sig Start Date End Date Taking? Authorizing Provider  alfuzosin (UROXATRAL) 10 MG 24 hr tablet Take 1 tablet (10 mg total) by mouth in the morning and at bedtime. 07/11/21  Yes McKenzie, Candee Furbish, MD  aspirin 81 MG tablet Take 81 mg by mouth daily.   Yes [provider]  buPROPion (WELLBUTRIN XL) 300 MG 24 hr tablet Take 300 mg by mouth daily. 08/18/18  Yes [provider]  busPIRone (BUSPAR) 15 MG tablet Take 15 mg by mouth 2 (two) times daily.   Yes [provider]  carvedilol (COREG) 3.125 MG tablet Take 1 tablet (3.125 mg total) by mouth 2 (two) times daily. 09/11/21  Yes Meng, Isaac Laud, PA  fish oil-omega-3 fatty acids 1000 MG capsule Take 1 g by mouth daily.   Yes [provider]  Liraglutide -Weight Management (SAXENDA) 18 MG/3ML SOPN Inject 3 mg as directed daily. 07/14/18  Yes [provider]  nitroGLYCERIN (NITROSTAT) 0.4 MG SL tablet Place 1 tablet (0.4 mg total) under the tongue every 5 (five) minutes as needed for chest pain. 08/23/21  Yes Almyra Deforest, PA  rosuvastatin (CRESTOR) 20 MG tablet Take 1 tablet (20 mg total) by mouth daily. 08/23/21  Yes Troy Sine, MD  sacubitril-valsartan (ENTRESTO) 24-26 MG  Take 1 tablet by mouth 2 (two) times daily. 09/11/21  Yes Almyra Deforest, PA  testosterone cypionate (DEPOTESTOSTERONE CYPIONATE) 200 MG/ML injection Inject 0.5 mLs (100 mg total) into the muscle every 7 (seven) days. 07/11/21  Yes McKenzie, Candee Furbish, MD  NON FORMULARY at bedtime. CPAP    [provider]    Inpatient Medications: Scheduled Meds:  [START ON 10/21/2021] alfuzosin  10 mg Oral Q breakfast   aspirin EC  81 mg Oral Daily   buPROPion  300 mg Oral Daily   busPIRone  15 mg Oral BID   carvedilol  3.125 mg Oral BID WC    heparin  5,000 Units Subcutaneous Q8H   isosorbide mononitrate  30 mg Oral Daily   omega-3 acid ethyl esters  1 g Oral BID   pantoprazole  40 mg Oral Daily   rosuvastatin  20 mg Oral Daily   sacubitril-valsartan  1 tablet Oral BID   Continuous Infusions:  PRN Meds: acetaminophen, nitroGLYCERIN, ondansetron (ZOFRAN) IV  Allergies:    Allergies  Allergen Reactions   No Known Allergies     Social History:   Social History   Socioeconomic History   Marital status: Married    Spouse name: Not on file   Number of children: 4   Years of education: Not on file   Highest education level: Not on file  Occupational History   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  Vaping Use   Vaping Use: Never used  Substance and Sexual Activity   Alcohol use: Yes    Comment: 1-2 beers per month   Drug use: Never   Sexual activity: Not on file  Other Topics Concern   Not on file  Social History Narrative   Not on file   Social Determinants of Health   Financial Resource Strain: Not on file  Food Insecurity: Not on file  Transportation Needs: Not on file  Physical Activity: Not on file  Stress: Not on file  Social Connections: Not on file  Intimate Partner Violence: Not on file    Family History:    Family History  Problem Relation Age of Onset   Heart attack Brother      ROS:  Please see the history of present illness.   All other ROS reviewed and negative.     Physical Exam/Data:   Vitals:   10/20/21 1341 10/20/21 1347 10/20/21 1400 10/20/21 1652  BP: (!) 154/90 123/78  (!) 150/81  Pulse: 85 92  73  Resp: 16   11  Temp: 98.6 F (37 C)   98.4 F (36.9 C)  TempSrc: Oral   Oral  SpO2: 95% 94%  97%  Height:   '5\' 9"'$  (1.753 m)    No intake or output data in the 24 hours ending 10/20/21 1733    10/01/2021    3:48 PM 09/11/2021    3:15 PM  08/23/2021    2:43 PM  Last 3 Weights  Weight (lbs) 248 lb 248 lb 3.2 oz 247 lb 12.8 oz  Weight (kg) 112.492 kg 112.583 kg 112.401 kg     Body mass index is 36.62 kg/m.  Constitutional: No acute distress Eyes: sclera non-icteric, normal conjunctiva and lids ENMT: normal dentition, moist mucous membranes Cardiovascular: regular rhythm, normal rate, no murmur. S1 and S2 normal. No jugular venous  distention.  Respiratory: clear to auscultation bilaterally GI : normal bowel sounds, soft and nontender. No distention.   MSK: extremities warm, well perfused. No edema.  NEURO: grossly nonfocal exam, moves all extremities. PSYCH: alert and oriented x 3, normal mood and affect.   EKG:  The EKG was personally reviewed and demonstrates: Normal sinus rhythm Telemetry:  Telemetry was personally reviewed and demonstrates: Normal sinus rhythm  Relevant CV Studies: Independent review of CT performed in June and echocardiogram, no studies obtained this hospitalization thus far.  Laboratory Data:  High Sensitivity Troponin:   Recent Labs  Lab 10/20/21 0707 10/20/21 0909  TROPONINIHS 5 4     Chemistry Recent Labs  Lab 10/20/21 0707  NA 134*  K 4.5  CL 105  CO2 24  GLUCOSE 114*  BUN 18  CREATININE 1.00  CALCIUM 8.8*  GFRNONAA >60  ANIONGAP 5    Recent Labs  Lab 10/20/21 0707  PROT 7.3  ALBUMIN 4.0  AST 24  ALT 21  ALKPHOS 68  BILITOT 0.5   Lipids No results for input(s): "CHOL", "TRIG", "HDL", "LABVLDL", "LDLCALC", "CHOLHDL" in the last 168 hours.  Hematology Recent Labs  Lab 10/20/21 0707  WBC 6.2  RBC 6.46*  HGB 14.1  HCT 46.7  MCV 72.3*  MCH 21.8*  MCHC 30.2  RDW 21.5*  PLT 314   Thyroid No results for input(s): "TSH", "FREET4" in the last 168 hours.  BNPNo results for input(s): "BNP", "PROBNP" in the last 168 hours.  DDimer  Recent Labs  Lab 10/20/21 0910  DDIMER 0.31     Radiology/Studies:  [facility] Chest Port 1 View  Result Date: 10/20/2021 CLINICAL DATA:   Chest pain and tightness for 36 hours. EXAM: PORTABLE CHEST 1 VIEW COMPARISON:  None Available. FINDINGS: Normal cardiomediastinal contours. No pleural effusion or edema identified. No airspace opacities. The visualized osseous structures are unremarkable. IMPRESSION: No acute cardiopulmonary abnormalities. Electronically Signed   By: Kerby Moors M.D.   On: 10/20/2021 07:35     Assessment and Plan:   Principal Problem:   Chest pain Active Problems:   Obstructive sleep apnea on CPAP   Hyperlipidemia   BPH (benign prostatic hyperplasia)   Depression   Benign essential HTN   GERD (gastroesophageal reflux disease)   Systolic HF (heart failure) (HCC)  Chest pain -I independently reviewed the patient's prior imaging studies in the context of his current presentation with chest pain.  On his coronary CTA there is an at least moderate mid circumflex lesion, 50 to 69% stenosis.  Heart flow analysis sent in the context of current chest pain and this moderate lesion, with low likelihood of significant stenosis with an FFR of 0.83 beyond the mid circumflex lesion. -I have independently reviewed the images from his echocardiogram as well.  I feel his ejection fraction is closer to 50 to 55% on his study from September 04, 2021.  I do see where on apical images the ejection fraction may be considered borderline and I have shared with the patient that I feel his ejection fraction overall may be 50%, but likely not lower than that.  This likely represents low normal function --Blood pressure has been elevated particularly with times of stress.  I suspect he has hypertensive episodes which lead to chest pain in the setting of a moderate circumflex lesion or perhaps small vessel disease contributing to chest pain without evidence of ischemia.  We discussed good blood pressure control and angina management. -Initiate Imdur 30 mg daily  for antianginal effect, he had good relief of his chest pain with nitroglycerin  and notes that when he started [facility], his chest pain episodes were less frequent.  We discussed that blood pressure management may be the way to control his chest pain. -He is currently working with the office on patient assistance for [facility].  If his Delene Loll is approved, I would continue this medication for the time being since it is an excellent blood pressure medication as well.  However if his Delene Loll is not approved and proves to be cost prohibitive, would plan to transition to an Ace inhibitor or ARB that will be effective for blood pressure as well as low normal ejection fraction. -We discussed that if chest pain symptoms do not improve with management of angina, the mid circumflex vessel is approximately 2.5 mm proximal to the lesion and could be considered for coronary angiography with FFR and possible PCI for symptom relief.  He prefers a noninvasive strategy as outlined above and is in agreement with the plan.  We will keep his appointment with Almyra Deforest, PA August 28 and anticipate further decision making at that appointment.  Family at the bedside is in agreement with the plan as well.  Hypertension- For the time being, continue carvedilol, and Entresto.  We will add Imdur.  We will trend blood pressure overnight.  If still elevated but improvement in chest pain will further titrate blood pressure therapy.  His blood pressure at home was 113 over 70s, and he may have hypotension if therapy is uptitrated too quickly.  We will observe carefully.  Risk Assessment/Risk Scores:     HEAR Score (for undifferentiated chest pain) : 4          For questions or updates, please contact [place] Please consult [email or web address] for contact info under    Signed, Elouise Munroe, MD  10/20/2021 5:33 PM

## 2021-10-20 NOTE — ED Provider Notes (Signed)
[facility] EMERGENCY DEPARTMENT Provider Note   CSN: [id number] Arrival date & time: 10/20/21  2355     History  Chief Complaint  Patient presents with   Chest Pain    Stephen Walton is a 66 y.o. male.  Patient has a history of hypertension and hyperlipidemia.  He complains chest discomfort the last couple days with dyspnea on exertion.  He also has a history of cardiomyopathy.  He states he was supposed to see his cardiology PA on August 28 to discuss getting a cardiac cath.  The history is provided by the patient and medical records.  Chest Pain Pain location:  L chest Pain quality: aching   Pain radiates to:  Does not radiate Pain severity:  Moderate Onset quality:  Sudden Timing:  Intermittent Progression:  Waxing and waning Chronicity:  New Context: not breathing   Relieved by:  Nothing Worsened by:  Nothing Associated symptoms: no abdominal pain, no back pain, no cough, no fatigue and no headache        Home Medications Prior to Admission medications   Medication Sig Start Date End Date Taking? Authorizing Provider  alfuzosin (UROXATRAL) 10 MG 24 hr tablet Take 1 tablet (10 mg total) by mouth in the morning and at bedtime. 07/11/21   McKenzie, Candee Furbish, MD  aspirin 81 MG tablet Take 81 mg by mouth daily.    [provider]  buPROPion (WELLBUTRIN XL) 300 MG 24 hr tablet Take 300 mg by mouth daily. 08/18/18   [provider]  busPIRone (BUSPAR) 15 MG tablet Take 15 mg by mouth 2 (two) times daily.    [provider]  carvedilol (COREG) 3.125 MG tablet Take 1 tablet (3.125 mg total) by mouth 2 (two) times daily. 09/11/21   Almyra Deforest, PA  fish oil-omega-3 fatty acids 1000 MG capsule Take 1 g by mouth daily.    [provider]  Liraglutide -Weight Management (SAXENDA) 18 MG/3ML SOPN Inject 3 mg as directed daily. 07/14/18   [provider]  nitroGLYCERIN (NITROSTAT) 0.4 MG SL tablet Place 1 tablet (0.4 mg total) under the  tongue every 5 (five) minutes as needed for chest pain. 08/23/21   Almyra Deforest, PA  NON FORMULARY at bedtime. CPAP    [provider]  rosuvastatin (CRESTOR) 20 MG tablet Take 1 tablet (20 mg total) by mouth daily. 08/23/21   Troy Sine, MD  sacubitril-valsartan (ENTRESTO) 24-26 MG Take 1 tablet by mouth 2 (two) times daily. 09/11/21   Almyra Deforest, PA  testosterone cypionate (DEPOTESTOSTERONE CYPIONATE) 200 MG/ML injection Inject 0.5 mLs (100 mg total) into the muscle every 7 (seven) days. 07/11/21   McKenzie, Candee Furbish, MD      Allergies    No known allergies    Review of Systems   Review of Systems  Constitutional:  Negative for appetite change and fatigue.  HENT:  Negative for congestion, ear discharge and sinus pressure.   Eyes:  Negative for discharge.  Respiratory:  Negative for cough.   Cardiovascular:  Positive for chest pain.  Gastrointestinal:  Negative for abdominal pain and diarrhea.  Genitourinary:  Negative for frequency and hematuria.  Musculoskeletal:  Negative for back pain.  Skin:  Negative for rash.  Neurological:  Negative for seizures and headaches.  Psychiatric/Behavioral:  Negative for hallucinations.     Physical Exam Updated Vital Signs BP 122/72   Pulse 75   Temp 98.4 F (36.9 C) (Oral)   Resp (!) 23  SpO2 97%  Physical Exam Vitals and nursing note reviewed.  Constitutional:      Appearance: He is well-developed.  HENT:     Head: Normocephalic.     Nose: Nose normal.  Eyes:     General: No scleral icterus.    Conjunctiva/sclera: Conjunctivae normal.  Neck:     Thyroid: No thyromegaly.  Cardiovascular:     Rate and Rhythm: Normal rate and regular rhythm.     Heart sounds: No murmur heard.    No friction rub. No gallop.  Pulmonary:     Breath sounds: No stridor. No wheezing or rales.  Chest:     Chest wall: No tenderness.  Abdominal:     General: There is no distension.     Tenderness: There is no abdominal tenderness. There is no  rebound.  Musculoskeletal:        General: Normal range of motion.     Cervical back: Neck supple.  Lymphadenopathy:     Cervical: No cervical adenopathy.  Skin:    Findings: No erythema or rash.  Neurological:     Mental Status: He is alert and oriented to person, place, and time.     Motor: No abnormal muscle tone.     Coordination: Coordination normal.  Psychiatric:        Behavior: Behavior normal.     ED Results / Procedures / Treatments   Labs (all labs ordered are listed, but only abnormal results are displayed) Labs Reviewed  CBC WITH DIFFERENTIAL/PLATELET - Abnormal; Notable for the following components:      Result Value   RBC 6.46 (*)    MCV 72.3 (*)    MCH 21.8 (*)    RDW 21.5 (*)    All other components within normal limits  COMPREHENSIVE METABOLIC PANEL - Abnormal; Notable for the following components:   Sodium 134 (*)    Glucose, Bld 114 (*)    Calcium 8.8 (*)    All other components within normal limits  D-DIMER, QUANTITATIVE  TROPONIN I (HIGH SENSITIVITY)  TROPONIN I (HIGH SENSITIVITY)    EKG None  Radiology DG Chest Port 1 View  Result Date: 10/20/2021 CLINICAL DATA:  Chest pain and tightness for 36 hours. EXAM: PORTABLE CHEST 1 VIEW COMPARISON:  None Available. FINDINGS: Normal cardiomediastinal contours. No pleural effusion or edema identified. No airspace opacities. The visualized osseous structures are unremarkable. IMPRESSION: No acute cardiopulmonary abnormalities. Electronically Signed   By: Kerby Moors M.D.   On: 10/20/2021 07:35    Procedures Procedures    Medications Ordered in ED Medications  nitroGLYCERIN (NITROSTAT) SL tablet 0.4 mg (0.4 mg Sublingual Given 10/20/21 4098)    ED Course/ Medical Decision Making/ A&P I spoke with cardiology over [facility] and they requested the patient be admitted to medicine at [facility] with cardiology consult                         Medical Decision Making Amount and/or Complexity of Data  Reviewed Labs: ordered. Radiology: ordered.  Risk Prescription drug management. Decision regarding hospitalization.   This patient presents to the ED for concern of chest pain, this involves an extensive number of treatment options, and is a complaint that carries with it a high risk of complications and morbidity.  The differential diagnosis includes atypical chest pain, coronary artery disease   Co morbidities that complicate the patient evaluation  Hypertension and hyperlipidemia   Additional history obtained:  Additional  history obtained from friend External records from outside source obtained and reviewed including hospital record   Lab Tests:  I Ordered, and personally interpreted labs.  The pertinent results include: CBC unremarkable and troponin x2 negative   Imaging Studies ordered:  I ordered imaging studies including chest x-ray I independently visualized and interpreted imaging which showed negative I agree with the radiologist interpretation   Cardiac Monitoring: / EKG:  The patient was maintained on a cardiac monitor.  I personally viewed and interpreted the cardiac monitored which showed an underlying rhythm of: Normal sinus rhythm   Consultations Obtained:  I requested consultation with the cardiology and hospitalist,  and discussed lab and imaging findings as well as pertinent plan - they recommend: Admit to [facility] by hospitalist with cardiology consult   Problem List / ED Course / Critical interventions / Medication management  Hypertension and chest pain I ordered medication including nitroglycerin for chest pain Reevaluation of the patient after these medicines showed that the patient improved I have reviewed the patients home medicines and have made adjustments as needed   Social Determinants of Health:  None   Test / Admission - Considered:  Patient may need cardiac cath     Chest pain relieved by nitro and history of  cardiomyopathy hypertension and hyperlipidemia.  Will be admitted to medicine with cardiology consult        Final Clinical Impression(s) / ED Diagnoses Final diagnoses:  Nonspecific chest pain    Rx / DC Orders ED Discharge Orders     None         Milton Ferguson, MD 10/20/21 1718

## 2021-10-20 NOTE — Assessment & Plan Note (Signed)
-   Overall stable mood -Continue the use of BuSpar and Wellbutrin.

## 2021-10-21 DIAGNOSIS — I10 Essential (primary) hypertension: Secondary | ICD-10-CM | POA: Diagnosis not present

## 2021-10-21 DIAGNOSIS — R079 Chest pain, unspecified: Secondary | ICD-10-CM | POA: Diagnosis not present

## 2021-10-21 DIAGNOSIS — I200 Unstable angina: Secondary | ICD-10-CM

## 2021-10-21 DIAGNOSIS — I209 Angina pectoris, unspecified: Secondary | ICD-10-CM | POA: Diagnosis not present

## 2021-10-21 MED ORDER — ENOXAPARIN SODIUM 40 MG/0.4ML IJ SOSY: 40.0000 mg | PREFILLED_SYRINGE | INTRAMUSCULAR | Status: DC

## 2021-10-21 MED ORDER — ALFUZOSIN HCL ER 10 MG PO TB24
10.0000 mg | ORAL_TABLET | Freq: Every day | ORAL | Status: DC
  Administered 2021-10-21: 10 mg via ORAL
  Filled 2021-10-21: qty 1

## 2021-10-21 MED ORDER — ISOSORBIDE MONONITRATE ER 30 MG PO TB24: 30.0000 mg | ORAL_TABLET | Freq: Every day | ORAL | 2 refills | Status: DC

## 2021-10-21 NOTE — Progress Notes (Signed)
Progress Note  Patient Name: Stephen Walton Date of Encounter: 10/21/2021  [facility] Cardiologist: Shelva Majestic, MD   Subjective   No recurrent chest pain after starting Imdur, very mild headache.  Inpatient Medications    Scheduled Meds:  alfuzosin  10 mg Oral Q breakfast   aspirin EC  81 mg Oral Daily   buPROPion  300 mg Oral Daily   busPIRone  15 mg Oral BID   carvedilol  3.125 mg Oral BID WC   heparin  5,000 Units Subcutaneous Q8H   isosorbide mononitrate  30 mg Oral Daily   omega-3 acid ethyl esters  1 g Oral BID   pantoprazole  40 mg Oral Daily   rosuvastatin  20 mg Oral Daily   sacubitril-valsartan  1 tablet Oral BID   Continuous Infusions:  PRN Meds: acetaminophen, nitroGLYCERIN, ondansetron (ZOFRAN) IV   Vital Signs    Vitals:   10/21/21 0015 10/21/21 0504 10/21/21 0826 10/21/21 0827  BP: 106/65 104/61  115/64  Pulse: 72 71  68  Resp: 19 18    Temp: 98 F (36.7 C) 98 F (36.7 C) 98 F (36.7 C)   TempSrc: Oral Oral Oral   SpO2: 97% 97%    Height:        Intake/Output Summary (Last 24 hours) at 10/21/2021 0906 Last data filed at 10/20/2021 2213 Gross per 24 hour  Intake 240 ml  Output --  Net 240 ml      10/01/2021    3:48 PM 09/11/2021    3:15 PM 08/23/2021    2:43 PM  Last 3 Weights  Weight (lbs) 248 lb 248 lb 3.2 oz 247 lb 12.8 oz  Weight (kg) 112.492 kg 112.583 kg 112.401 kg      Telemetry    Sinus rhythm- Personally Reviewed  ECG    No new- Personally Reviewed  Physical Exam   GEN: No acute distress.   Neck: No JVD Cardiac: RRR, no murmurs, rubs, or gallops.  Respiratory: Clear to auscultation bilaterally. GI: Soft, nontender, non-distended  MS: No edema; No deformity. Neuro:  Nonfocal  Psych: Normal affect   Labs    High Sensitivity Troponin:   Recent Labs  Lab 10/20/21 0707 10/20/21 0909  TROPONINIHS 5 4     Chemistry Recent Labs  Lab 10/20/21 0707  NA 134*  K 4.5  CL 105  CO2 24  GLUCOSE 114*  BUN 18   CREATININE 1.00  CALCIUM 8.8*  PROT 7.3  ALBUMIN 4.0  AST 24  ALT 21  ALKPHOS 68  BILITOT 0.5  GFRNONAA >60  ANIONGAP 5    Lipids No results for input(s): "CHOL", "TRIG", "HDL", "LABVLDL", "LDLCALC", "CHOLHDL" in the last 168 hours.  Hematology Recent Labs  Lab 10/20/21 0707  WBC 6.2  RBC 6.46*  HGB 14.1  HCT 46.7  MCV 72.3*  MCH 21.8*  MCHC 30.2  RDW 21.5*  PLT 314   Thyroid No results for input(s): "TSH", "FREET4" in the last 168 hours.  BNPNo results for input(s): "BNP", "PROBNP" in the last 168 hours.  DDimer  Recent Labs  Lab 10/20/21 0910  DDIMER 0.31     Radiology    DG Chest Port 1 View  Result Date: 10/20/2021 CLINICAL DATA:  Chest pain and tightness for 36 hours. EXAM: PORTABLE CHEST 1 VIEW COMPARISON:  None Available. FINDINGS: Normal cardiomediastinal contours. No pleural effusion or edema identified. No airspace opacities. The visualized osseous structures are unremarkable. IMPRESSION: No acute cardiopulmonary abnormalities.  Electronically Signed   By: Kerby Moors M.D.   On: 10/20/2021 07:35    Cardiac Studies   None  Patient Profile     66 y.o. 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  Assessment & Plan    Principal Problem:   Chest pain Active Problems:   Obstructive sleep apnea on CPAP   Hyperlipidemia   BPH (benign prostatic hyperplasia)   Depression   Benign essential HTN   GERD (gastroesophageal reflux disease)   Systolic HF (heart failure) (HCC)  Chest pain HTN -please see initial consult note for full details of imaging review.  I feel his ejection fraction is likely low normal 50 to 55% and independent review of coronary CTA images demonstrate a moderate circumflex stenosis with mixed atherosclerotic plaque 50 to 69% with nonhemodynamically significant CT FFR assessment. I have initiated Imdur for antianginal  therapy.  He has follow-up with Almyra Deforest, PA August 28.  If he has continued or progressive chest pain, could consider cardiac catheterization for the circumflex lesion with invasive hemodynamics, the vessel is 2.5 mm proximal to the lesion and may be amenable to PCI.  However I think we will be successful with antianginal therapy.  He likely has a low normal ejection fraction, however does have hypertension and may benefit from remaining on Entresto from that standpoint if it is covered.  If not covered, would transition to ACE inhibitor or ARB at his next outpatient appointment.  Could obtain repeat echocardiogram and would recommend 3D EF for complete assessment of LV function.  Blood pressure better controlled today and parameters for blood pressure management home-going were given.  No other changes to medical therapy, patient is stable for hospital discharge from a cardiac standpoint today.  Discussed with patient and his wife at the bedside who are in agreement.       For questions or updates, please contact [place] Please consult [email or web address] for contact info under        Signed, Elouise Munroe, MD  10/21/2021, 9:06 AM

## 2021-10-21 NOTE — Discharge Summary (Signed)
DISCHARGE SUMMARY  Stephen Walton  MR#: [id number]  DOB:Aug 09, 1955  Date of Admission: 10/20/2021 Date of Discharge: 10/21/2021  Attending Physician:Yamin Swingler Hennie Duos, MD  Patient's [facility], [facility]  Consults: [facility]  Disposition: Discharge home  Follow-up Appts:  Follow-up Information     [facility] Follow up in 7 day(s).   Specialty: Family Medicine Contact information: [address] [place] [address] [phone]         Troy Sine, MD .   Specialty: Cardiology Contact information: [address] [place] [address] [phone]                 Discharge Diagnoses: Chest pain - angina  Chronic systolic CHF HTN Depression BPH  Hyperlipidemia Obstructive sleep apnea on CPAP  Initial presentation: 67 year old with a history of OSA on CPAP, HTN, HLD, GERD, BPH, chronic systolic CHF, and depression who [facility] to [facility] with complaints of 36 hours of continuous chest pain and shortness of breath with no aggravating or alleviating factors.  In the ER troponin was unrevealing and EKG was without acute changes.  The patient's case was discussed with cardiology who suggested transfer to [facility] for definitive cardiac evaluation.  Hospital Course:  Chest pain responded to nitroglycerin - continue aspirin, carvedilol, Entresto, statin and PPI -has been undergoing outpatient evaluation to include cardiac CT suggestive of mild CAD -Imdur added for antianginal effect -symptoms felt to be related to episodic hypertensive episodes in the setting of suspected moderate circumflex disease  Care was directed by Cardiology as follows: I feel his ejection fraction is likely low normal 50 to 55% and independent review of coronary CTA images demonstrate a moderate circumflex stenosis with mixed atherosclerotic plaque 50 to 69% with nonhemodynamically significant CT FFR  assessment. I have initiated Imdur for antianginal therapy.  He has follow-up with Almyra Deforest, PA August 28.  If he has continued or progressive chest pain, could consider cardiac catheterization for the circumflex lesion with invasive hemodynamics, the vessel is 2.5 mm proximal to the lesion and may be amenable to PCI.  However I think we will be successful with antianginal therapy.  He likely has a low normal ejection fraction, however does have hypertension and may benefit from remaining on Entresto from that standpoint if it is covered.  If not covered, would transition to ACE inhibitor or ARB at his next outpatient appointment.  Could obtain repeat echocardiogram and would recommend 3D EF for complete assessment of LV function.  Blood pressure better controlled today and parameters for blood pressure management home-going were given.  No other changes to medical therapy, patient is stable for hospital discharge from a cardiac standpoint today.  Discussed with patient and his wife at the bedside who are in agreement.   Chronic systolic CHF Continue beta-blocker and Entresto - most recent TTE notes EF 40-45%   HTN   Depression Continue usual BuSpar and Wellbutrin   BPH  continue alfuzosin   Hyperlipidemia Continue statin   Obstructive sleep apnea on CPAP Continue CPAP nightly.  Allergies as of 10/21/2021       Reactions   No Known Allergies         Medication List     TAKE these medications    alfuzosin 10 MG 24 hr tablet Commonly known as: UROXATRAL Take 1 tablet (10 mg total) by mouth in the morning and at bedtime.   aspirin 81 MG tablet Take 81 mg  by mouth daily.   buPROPion 300 MG 24 hr tablet Commonly known as: WELLBUTRIN XL Take 300 mg by mouth daily.   busPIRone 15 MG tablet Commonly known as: BUSPAR Take 15 mg by mouth 2 (two) times daily.   carvedilol 3.125 MG tablet Commonly known as: COREG Take 1 tablet (3.125 mg total) by mouth 2 (two) times daily.    Entresto 24-26 MG Generic drug: sacubitril-valsartan Take 1 tablet by mouth 2 (two) times daily.   fish oil-omega-3 fatty acids 1000 MG capsule Take 1 g by mouth daily.   isosorbide mononitrate 30 MG 24 hr tablet Commonly known as: IMDUR Take 1 tablet (30 mg total) by mouth daily. Start taking on: October 22, 2021   nitroGLYCERIN 0.4 MG SL tablet Commonly known as: NITROSTAT Place 1 tablet (0.4 mg total) under the tongue every 5 (five) minutes as needed for chest pain.   NON FORMULARY at bedtime. CPAP   rosuvastatin 20 MG tablet Commonly known as: CRESTOR Take 1 tablet (20 mg total) by mouth daily.   Saxenda 18 MG/3ML Sopn Generic drug: Liraglutide -Weight Management Inject 3 mg as directed daily.   testosterone cypionate 200 MG/ML injection Commonly known as: DEPOTESTOSTERONE CYPIONATE Inject 0.5 mLs (100 mg total) into the muscle every 7 (seven) days.        Day of Discharge BP 115/64   Pulse 68   Temp 98 F (36.7 C) (Oral)   Resp 18   Ht '5\' 9"'$  (1.753 m)   SpO2 97%   BMI 36.62 kg/m   Physical Exam: General: No acute respiratory distress Lungs: Clear to auscultation bilaterally without wheezes or crackles Cardiovascular: Regular rate and rhythm without murmur gallop or rub normal S1 and S2 Abdomen: Nontender, nondistended, soft, bowel sounds positive, no rebound, no ascites, no appreciable mass Extremities: No significant cyanosis, clubbing, or edema bilateral lower extremities  Basic Metabolic Panel: Recent Labs  Lab 10/20/21 0707  NA 134*  K 4.5  CL 105  CO2 24  GLUCOSE 114*  BUN 18  CREATININE 1.00  CALCIUM 8.8*   CBC: Recent Labs  Lab 10/20/21 0707  WBC 6.2  NEUTROABS 3.7  HGB 14.1  HCT 46.7  MCV 72.3*  PLT 314    Time spent in discharge (includes decision making & examination of pt): 30 minutes  10/21/2021, 12:28 PM   Cherene Altes, MD [facility] Office  [phone]

## 2021-10-29 ENCOUNTER — Ambulatory Visit
Admission: EM | Admit: 2021-10-29 | Discharge: 2021-10-29 | Disposition: A | Discharge status: Home / Self Care | Payer: Medicare Other | Attending: Nurse Practitioner | Admitting: Nurse Practitioner

## 2021-10-29 ENCOUNTER — Encounter: Payer: Self-pay | Admitting: Emergency Medicine

## 2021-10-29 DIAGNOSIS — M545 Low back pain, unspecified: Secondary | ICD-10-CM

## 2021-10-29 LAB — POCT URINALYSIS DIP (MANUAL ENTRY)
Blood, UA: NEGATIVE
Glucose, UA: NEGATIVE mg/dL
Leukocytes, UA: NEGATIVE
Nitrite, UA: NEGATIVE
Protein Ur, POC: 100 mg/dL — AB
Spec Grav, UA: >=1.03 — AB (ref 1.010–1.025)
Urobilinogen, UA: 1 E.U./dL
pH, UA: 5 (ref 5.0–8.0)

## 2021-10-29 MED ORDER — TIZANIDINE HCL 4 MG PO TABS: 4.0000 mg | ORAL_TABLET | Freq: Four times a day (QID) | ORAL | 0 refills | Status: DC | PRN

## 2021-10-29 NOTE — Discharge Instructions (Signed)
-   The urine sample today does not show any blood or signs of infection.  I do not think your pain is from a kidney stone -I suspect your pain is musculoskeletal and this should improve over the next few weeks.  Start Tylenol 500 to 1000 mg every 6 hours as needed for pain -You can also use heat or ice to help with pain -At nighttime, take tizanidine 4 mg to help with muscle pain -Start the back exercises as tolerated -Follow-up with primary care provider if symptoms persist or worsen despite treatment

## 2021-10-29 NOTE — ED Triage Notes (Addendum)
Pt presents with left lower back pain that comes and goes. States is work with sitting and standing. Pt states had the urge to urinated multiple times during the night last night but was unable to do so.

## 2021-10-29 NOTE — ED Provider Notes (Signed)
[facility]    CSN: [id number] Arrival date & time: 10/29/21  1439      History   Chief Complaint Chief Complaint  Patient presents with   Back Pain    HPI Stephen Walton is a 66 y.o. male.   Patient presents for back pain that started a couple of days ago.  He denies any recent fall, accident, injury, or trauma to the back.  Reports he "may have been lifting something heavy that I should not be lifting at my age."  Reports the pain is severe at times, worse with sitting or standing or getting out of his recliner.  Denies any pain radiating down his legs.  Has not taken anything for the pain so far.  Denies numbness or tingling down his legs or in his feet or toes, saddle anesthesia, new bowel or bladder incontinence, fever, nausea/vomiting, dysuria/urinary frequency, and hematuria.   Medical history significant for BPH; reports last night, he was up about 10 times throughout the night trying to use the bathroom and only able to urinate little dribbles.  Reports over the weekend, he was in the hospital for chest pain and they told him to start drinking more water.  Reports he has been working on it.    Past Medical History:  Diagnosis Date   Chest pain, atypical    H/O echocardiogram 10/04/2011   EF >55%;mild concentric LVH; normals systolic & diastolic dysfunction; mild aortic stenosis   History of nuclear stress test 10/04/2011   low risk; small amount of basal inferior bowel artifact   Hyperlipidemia    OSA on CPAP     Patient Active Problem List   Diagnosis Date Noted   Chest pain 10/20/2021   Depression 10/20/2021   Benign essential HTN 10/20/2021   GERD (gastroesophageal reflux disease) 41/74/0814   Systolic HF (heart failure) ([place]) 10/20/2021   Special screening for malignant neoplasms, colon    Diverticulosis, sigmoid    BPH (benign prostatic hyperplasia) 04/26/2020   Nocturia 04/26/2020   Prostate cancer ([place]) 03/24/2019   Hypogonadism male  03/24/2019   Anxiety 12/18/2013   Hyperlipidemia 04/30/2013   Obstructive sleep apnea on CPAP 09/28/2012   Atypical chest pain 09/28/2012   Obesity 48/18/5631   Metabolic syndrome 49/70/2637    Past Surgical History:  Procedure Laterality Date   COLONOSCOPY N/A 11/14/2020   Procedure: COLONOSCOPY;  Surgeon: Aviva Signs, MD;  [facility];  Service: Gastroenterology;  Laterality: N/A;       Home Medications    Prior to Admission medications   Medication Sig Start Date End Date Taking? Authorizing Provider  tiZANidine (ZANAFLEX) 4 MG tablet Take 1 tablet (4 mg total) by mouth every 6 (six) hours as needed for muscle spasms. Do not take with alcohol or while driving or operating heavy machinery 10/29/21  Yes Eulogio Bear, NP  alfuzosin (UROXATRAL) 10 MG 24 hr tablet Take 1 tablet (10 mg total) by mouth in the morning and at bedtime. 07/11/21   McKenzie, Candee Furbish, MD  aspirin 81 MG tablet Take 81 mg by mouth daily.    [provider]  buPROPion (WELLBUTRIN XL) 300 MG 24 hr tablet Take 300 mg by mouth daily. 08/18/18   [provider]  busPIRone (BUSPAR) 15 MG tablet Take 15 mg by mouth 2 (two) times daily.    [provider]  carvedilol (COREG) 3.125 MG tablet Take 1 tablet (3.125 mg total) by mouth 2 (two) times daily. 09/11/21  Almyra Deforest, [place]  fish oil-omega-3 fatty acids 1000 MG capsule Take 1 g by mouth daily.    [provider]  isosorbide mononitrate (IMDUR) 30 MG 24 hr tablet Take 1 tablet (30 mg total) by mouth daily. 10/22/21   Cherene Altes, MD  Liraglutide -Weight Management (SAXENDA) 18 MG/3ML SOPN Inject 3 mg as directed daily. 07/14/18   [provider]  nitroGLYCERIN (NITROSTAT) 0.4 MG SL tablet Place 1 tablet (0.4 mg total) under the tongue every 5 (five) minutes as needed for chest pain. 08/23/21   Almyra Deforest, PA  NON FORMULARY at bedtime. CPAP    [provider]  rosuvastatin (CRESTOR) 20 MG tablet Take  1 tablet (20 mg total) by mouth daily. 08/23/21   Troy Sine, MD  sacubitril-valsartan (ENTRESTO) 24-26 MG Take 1 tablet by mouth 2 (two) times daily. 09/11/21   Almyra Deforest, PA  testosterone cypionate (DEPOTESTOSTERONE CYPIONATE) 200 MG/ML injection Inject 0.5 mLs (100 mg total) into the muscle every 7 (seven) days. 07/11/21   McKenzie, Candee Furbish, MD    Family History Family History  Problem Relation Age of Onset   Heart attack Brother     Social History Social History   Tobacco Use   Smoking status: Former   Smokeless tobacco: Never   Tobacco comments:    quit 30 years ago.  Vaping Use   Vaping Use: Never used  Substance Use Topics   Alcohol use: Yes    Comment: 1-2 beers per month   Drug use: Never     Allergies   No known allergies   Review of Systems Review of Systems Per HPI  Physical Exam Triage Vital Signs ED Triage Vitals  Enc Vitals Group     BP 10/29/21 1530 101/62     Pulse Rate 10/29/21 1530 79     Resp 10/29/21 1530 17     Temp 10/29/21 1530 97.8 F (36.6 C)     Temp Source 10/29/21 1530 Oral     SpO2 10/29/21 1530 95 %     Weight --      Height --      Head Circumference --      Peak Flow --      Pain Score 10/29/21 1528 7     Pain Loc --      Pain Edu? --      Excl. in [place]? --    No data found.  Updated Vital Signs BP 101/62 (BP Location: Right Arm)   Pulse 79   Temp 97.8 F (36.6 C) (Oral)   Resp 17   SpO2 95%   Visual Acuity Right Eye Distance:   Left Eye Distance:   Bilateral Distance:    Right Eye Near:   Left Eye Near:    Bilateral Near:     Physical Exam Vitals and nursing note reviewed.  Constitutional:      General: He is not in acute distress.    Appearance: Normal appearance. He is not toxic-appearing.  HENT:     Head: Normocephalic and atraumatic.     Mouth/Throat:     Mouth: Mucous membranes are moist.     Pharynx: Oropharynx is clear.  Pulmonary:     Effort: Pulmonary effort is normal. No respiratory  distress.  Abdominal:     Tenderness: There is no right CVA tenderness or left CVA tenderness.  Musculoskeletal:     Lumbar back: Tenderness present. No swelling, edema, deformity, lacerations or bony tenderness. Normal  range of motion. Negative right straight leg raise test and negative left straight leg raise test.       Back:     Right lower leg: No edema.     Left lower leg: No edema.     Comments: Inspection: No swelling, obvious deformity or redness to lumbar spine Palpation: Lumbar spine tender to palpation in the area marked; no obvious deformities palpated ROM: Full ROM to lumbar spine, bilateral lower extremities Strength: 5/5 bilateral lower extremities Neurovascular: neurovascularly intact in left and right lower extremity   Skin:    General: Skin is warm and dry.     Capillary Refill: Capillary refill takes less than 2 seconds.     Coloration: Skin is not jaundiced or pale.     Findings: No erythema.  Neurological:     Mental Status: He is alert and oriented to person, place, and time.     Motor: No weakness.     Gait: Gait normal.  Psychiatric:        Behavior: Behavior is cooperative.      UC Treatments / Results  Labs (all labs ordered are listed, but only abnormal results are displayed) Labs Reviewed  POCT URINALYSIS DIP (MANUAL ENTRY) - Abnormal; Notable for the following components:      Result Value   Color, UA straw (*)    Bilirubin, UA small (*)    Ketones, POC UA small (15) (*)    Spec Grav, UA >=1.030 (*)    Protein Ur, POC =100 (*)    All other components within normal limits    EKG   Radiology No results found.  Procedures Procedures (including critical care time)  Medications Ordered in UC Medications - No data to display  Initial Impression / Assessment and Plan / UC Course  I have reviewed the triage vital signs and the nursing notes.  Pertinent labs & imaging results that were available during my care of the patient were  reviewed by me and considered in my medical decision making (see chart for details).    Patient is a very pleasant, well-appearing 66 year old male presenting for acute left-sided low back pain without sciatica today.  In triage, vital signs are stable, patient afebrile, normotensive, and not tachycardic.  He is oxygenating well on room air.  Urinalysis today is without signs of a kidney stone or urinary tract infection.  Discussed elevated specific gravity, continue work on increasing hydration.  Suspect back pain is of muscular origin.  Need to avoid NSAIDs given history of heart failure.  Start Tylenol 500 to 1000 mg every 6 hours as needed for pain.  Discussed back exercises, use of tizanidine at nighttime to help with pain.  Follow-up with primary care provider if symptoms persist or worsen despite treatment. Final Clinical Impressions(s) / UC Diagnoses   Final diagnoses:  Acute left-sided low back pain without sciatica     Discharge Instructions      - The urine sample today does not show any blood or signs of infection.  I do not think your pain is from a kidney stone -I suspect your pain is musculoskeletal and this should improve over the next few weeks.  Start Tylenol 500 to 1000 mg every 6 hours as needed for pain -You can also use heat or ice to help with pain -At nighttime, take tizanidine 4 mg to help with muscle pain -Start the back exercises as tolerated -Follow-up with primary care provider if symptoms persist or worsen despite  treatment    ED Prescriptions     Medication Sig Dispense Auth. Provider   tiZANidine (ZANAFLEX) 4 MG tablet Take 1 tablet (4 mg total) by mouth every 6 (six) hours as needed for muscle spasms. Do not take with alcohol or while driving or operating heavy machinery 30 tablet Eulogio Bear, NP      PDMP not reviewed this encounter.   Eulogio Bear, NP 10/29/21 [phone]

## 2021-11-05 ENCOUNTER — Telehealth: Payer: Self-pay | Admitting: Physician Assistant

## 2021-11-05 NOTE — Telephone Encounter (Signed)
I contacted patient assistance- they spoke with patient today advised that they needed POI from patient, it was strongly advised that he turn this information in, nothing is needed from the office at this time.   He needs samples until he gets them this information, will route to pharmacy team.   Thanks!

## 2021-11-05 NOTE — Telephone Encounter (Signed)
Patient calling the office for samples of medication:   1.  What medication and dosage are you requesting samples for? sacubitril-valsartan (ENTRESTO) 24-26 MG  2.  Are you currently out of this medication? Yes Patient applied for assistance and doesn't know where in the process he is at.

## 2021-11-06 NOTE — Telephone Encounter (Signed)
Pt is returning call. Requesting call back.  

## 2021-11-06 NOTE — Telephone Encounter (Signed)
We don't have any of the 24/26 mg today - should have by Friday

## 2021-11-06 NOTE — Telephone Encounter (Signed)
Called patient, LVM- advised of message below.

## 2021-11-07 NOTE — Telephone Encounter (Signed)
Patient stated [employer] [facility] [phone]) needs the prescription for Entresto 24-26. Sample bottle set aside for patient. Spoke with Dineen Kid. at [employer] who stated patient needs to send in proof of income and our clinic needs to fax prescription at [phone]. Done.

## 2021-11-12 ENCOUNTER — Ambulatory Visit: Payer: Medicare Other | Attending: Physician Assistant | Admitting: Physician Assistant

## 2021-11-12 ENCOUNTER — Encounter: Payer: Self-pay | Admitting: Physician Assistant

## 2021-11-12 VITALS — BP 132/82 | HR 67 | Ht 69.0 in | Wt 249.2 lb

## 2021-11-12 DIAGNOSIS — E785 Hyperlipidemia, unspecified: Secondary | ICD-10-CM | POA: Diagnosis present

## 2021-11-12 DIAGNOSIS — I2510 Atherosclerotic heart disease of native coronary artery without angina pectoris: Secondary | ICD-10-CM | POA: Insufficient documentation

## 2021-11-12 MED ORDER — VALSARTAN 40 MG PO TABS: 40.0000 mg | ORAL_TABLET | Freq: Two times a day (BID) | ORAL | 3 refills | Status: DC

## 2021-11-12 NOTE — Progress Notes (Unsigned)
Cardiology Office Note:    Date:  11/14/2021   ID:  GLORIA LAMBERTSON, DOB 12-29-1955, MRN [id number]  PCP:  [facility] [place] [facility] Cardiologist:  Shelva Majestic, MD     Referring MD: Cory Munch, PA-C   Chief Complaint  Patient presents with   Follow-up    Seen for Dr. Claiborne Billings    History of Present Illness:    Stephen Walton is a 66 y.o. male with a hx of OSA on CPAP, obesity, hyperlipidemia, and history of chest pain with negative work-up.  Patient previously had chest pain shortly after his brother had cardiac arrest.  Echocardiogram obtained at the time showed moderate LVH with normal systolic and diastolic function.  Myoview obtained in July 2013 showed normal perfusion.  I saw the patient on 08/23/2021 for precordial chest pain.  He had prolonged chest pain 4 days prior to the office visit that lasted entire day.  We obtained a coronary CT that showed mild CAD.  Echocardiogram however showed EF was borderline low.  I discussed with the patient regarding medical therapy versus definitive cardiac catheterization.  We eventually decided on a trial of medical therapy first.  I started him on carvedilol and Entresto.  He presents back today for up titration of heart failure therapy.  Since the last visit, patient was admitted to the hospital on 10/20/2021 and was seen by Dr. Margaretann Loveless who has independently reviewed the previous coronary CT and echocardiogram.  It was felt that her EF was low normal at 50 to 55%.  It was also felt the patient likely has a at least moderate disease in left circumflex artery, however FFR was negative.  Dr. Margaretann Loveless suspected his chest pain was more related to blood pressure spikes.  Imdur was added during this hospitalization.  However if the patient continues to have chest pain, it was recommended to consider coronary angiography with PCI.  Patient presents today for follow-up.  Unfortunately, he is still unable to get  assistance for [facility], we have decided to switch him to valsartan 40 mg twice a day dosing.  He is tolerating Imdur without significant headache.  He can follow-up with Dr. Claiborne Billings as previously scheduled.  Past Medical History:  Diagnosis Date   Chest pain, atypical    H/O echocardiogram 10/04/2011   EF >55%;mild concentric LVH; normals systolic & diastolic dysfunction; mild aortic stenosis   History of nuclear stress test 10/04/2011   low risk; small amount of basal inferior bowel artifact   Hyperlipidemia    OSA on CPAP     Past Surgical History:  Procedure Laterality Date   COLONOSCOPY N/A 11/14/2020   Procedure: COLONOSCOPY;  Surgeon: Aviva Signs, MD;  [facility];  Service: Gastroenterology;  Laterality: N/A;    Current Medications: Current Meds  Medication Sig   alfuzosin (UROXATRAL) 10 MG 24 hr tablet Take 1 tablet (10 mg total) by mouth in the morning and at bedtime.   aspirin 81 MG tablet Take 81 mg by mouth daily.   buPROPion (WELLBUTRIN XL) 300 MG 24 hr tablet Take 300 mg by mouth daily.   busPIRone (BUSPAR) 15 MG tablet Take 15 mg by mouth 2 (two) times daily.   carvedilol (COREG) 3.125 MG tablet Take 1 tablet (3.125 mg total) by mouth 2 (two) times daily.   fish oil-omega-3 fatty acids 1000 MG capsule Take 1 g by mouth daily.   isosorbide mononitrate (IMDUR) 30 MG 24 hr  tablet Take 1 tablet (30 mg total) by mouth daily.   Liraglutide -Weight Management (SAXENDA) 18 MG/3ML SOPN Inject 3 mg as directed daily.   nitroGLYCERIN (NITROSTAT) 0.4 MG SL tablet Place 1 tablet (0.4 mg total) under the tongue every 5 (five) minutes as needed for chest pain.   NON FORMULARY at bedtime. CPAP   rosuvastatin (CRESTOR) 20 MG tablet Take 1 tablet (20 mg total) by mouth daily.   testosterone cypionate (DEPOTESTOSTERONE CYPIONATE) 200 MG/ML injection Inject 0.5 mLs (100 mg total) into the muscle every 7 (seven) days.   tiZANidine (ZANAFLEX) 4 MG tablet Take 1 tablet (4 mg  total) by mouth every 6 (six) hours as needed for muscle spasms. Do not take with alcohol or while driving or operating heavy machinery   valsartan (DIOVAN) 40 MG tablet Take 1 tablet (40 mg total) by mouth 2 (two) times daily.   [DISCONTINUED] sacubitril-valsartan (ENTRESTO) 24-26 MG Take 1 tablet by mouth 2 (two) times daily.     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  Vaping Use   Vaping Use: Never used  Substance and Sexual Activity   Alcohol use: Yes    Comment: 1-2 beers per month   Drug use: Never   Sexual activity: Not on file  Other Topics Concern   Not on file  Social History Narrative   Not on file   Social Determinants of Health   Financial Resource Strain: Not on file  Food Insecurity: Not on file  Transportation Needs: Not on file  Physical Activity: Not on file  Stress: Not on file  Social Connections: Not on file     Family History: The patient's family history includes Heart attack in his brother.  ROS:   Please see the history of present illness.     All other systems reviewed and are negative.  EKGs/Labs/Other Studies Reviewed:    The following studies were reviewed today:  Coronary CT 08/28/2021 IMPRESSION: 1. Coronary calcium score of 70.6. This was 47th percentile for age-, sex, and race-matched controls.   2.  Normal coronary origin with right dominance.   3.  Mild atherosclerosis of the LAD and LCx.  CAD RADS 2.   4.  Recommend preventive therapy and risk factor modification.   Echo 09/04/2021  1. Left ventricular ejection fraction, by estimation, is 40 to 45%. The   left ventricle has mildly decreased function. The left ventricle has no  regional wall motion abnormalities. There is mild left ventricular  hypertrophy. Left ventricular diastolic  parameters were normal. The average left ventricular global longitudinal  strain is -13.7 %. The global longitudinal strain is abnormal.   2. Right ventricular systolic function is normal. The right ventricular  size is normal.   3. The mitral valve is normal in structure. Trivial mitral valve  regurgitation.   4. The aortic valve is normal in structure. Aortic valve regurgitation is  not visualized. No aortic stenosis is present.   5. The inferior vena cava is normal in size with greater  than 50%  respiratory variability, suggesting right atrial pressure of 3 mmHg.     EKG:  EKG is not ordered today.    Recent Labs: 10/20/2021: ALT 21; BUN 18; Creatinine, Ser 1.00; Hemoglobin 14.1; Platelets 314; Potassium 4.5; Sodium 134  Recent Lipid Panel    Component Value Date/Time   CHOL 110 05/05/2020 1209   TRIG 77 05/05/2020 1209   HDL 43 05/05/2020 1209   CHOLHDL 2.6 05/05/2020 1209   LDLCALC 51 05/05/2020 1209     Risk Assessment/Calculations:           Physical Exam:    VS:  BP 132/82   Pulse 67   Ht '5\' 9"'$  (1.753 m)   Wt 249 lb 3.2 oz (113 kg)   SpO2 98%   BMI 36.80 kg/m        Wt Readings from Last 3 Encounters:  11/12/21 249 lb 3.2 oz (113 kg)  10/01/21 248 lb (112.5 kg)  09/11/21 248 lb 3.2 oz (112.6 kg)     GEN:  Well nourished, well developed in no acute distress HEENT: Normal NECK: No JVD; No carotid bruits LYMPHATICS: No lymphadenopathy CARDIAC: RRR, no murmurs, rubs, gallops RESPIRATORY:  Clear to auscultation without rales, wheezing or rhonchi  ABDOMEN: Soft, non-tender, non-distended MUSCULOSKELETAL:  No edema; No deformity  SKIN: Warm and dry NEUROLOGIC:  Alert and oriented x 3 PSYCHIATRIC:  Normal affect   ASSESSMENT:    1. Coronary artery disease involving  native coronary artery of native heart without angina pectoris   2. Hyperlipidemia LDL goal <70    PLAN:    In order of problems listed above:  CAD: Denies significant chest discomfort.  Previous echocardiogram shows EF 40 to 45%, this was recently reviewed by Dr. Margaretann Loveless who felt the ejection fraction is more closer to 50 to 55%.  Patient is unable to afford Entresto, will change Entresto to valsartan.  If continues to have chest pain in the future, will consider cardiac catheterization.  Hyperlipidemia: Continue Crestor           Medication Adjustments/Labs and Tests Ordered: Current medicines are reviewed at length with the patient today.  Concerns regarding medicines are outlined above.  No orders of the defined types were placed in this encounter.  Meds ordered this encounter  Medications   valsartan (DIOVAN) 40 MG tablet    Sig: Take 1 tablet (40 mg total) by mouth 2 (two) times daily.    Dispense:  180 tablet    Refill:  3    Patient Instructions  Medication Instructions:  Your physician has recommended you make the following change in your medication STOP: Entresto  START: Valsartan '40mg'$  TWICE DAILY.  *If you need a refill on your cardiac medications before your next appointment, please call your pharmacy*   Lab Work: NONE  If you have labs (blood work) drawn today and your tests are completely normal, you will receive your results only by: [place] (if you have MyChart) OR A paper copy in the mail If you have any lab test that is abnormal or we need to change your treatment, we will call you to review the results.   Testing/Procedures: NONE   Follow-Up: At [facility], you and your health needs are our priority.  As part of our continuing mission to provide you with exceptional heart care, we have created designated Provider Care Teams.  These Care Teams include your primary Cardiologist (physician) and Advanced Practice Providers (APPs -  Physician Assistants and Nurse Practitioners) who all work together to provide you with the care you need, when you need it.  We recommend signing up for the patient portal called "MyChart".  Sign up information is provided on this After Visit Summary.  MyChart is used to connect with patients for Virtual Visits (Telemedicine).  Patients are able to view lab/test results, encounter notes, upcoming appointments, etc.  Non-urgent messages can be sent to your provider as well.   To learn more about what you can do with MyChart, go to [email or web address].    Other Instructions Keep Upcoming appointment with Dr. Claiborne Billings in November.   Important Information About Sugar         Hilbert Corrigan, [place]  11/14/2021 11:51 PM    [place]

## 2021-11-12 NOTE — Patient Instructions (Signed)
Medication Instructions:  Your physician has recommended you make the following change in your medication STOP: Entresto  START: Valsartan '40mg'$  TWICE DAILY.  *If you need a refill on your cardiac medications before your next appointment, please call your pharmacy*   Lab Work: NONE  If you have labs (blood work) drawn today and your tests are completely normal, you will receive your results only by: [place] (if you have MyChart) OR A paper copy in the mail If you have any lab test that is abnormal or we need to change your treatment, we will call you to review the results.   Testing/Procedures: NONE   Follow-Up: At [facility], you and your health needs are our priority.  As part of our continuing mission to provide you with exceptional heart care, we have created designated Provider Care Teams.  These Care Teams include your primary Cardiologist (physician) and Advanced Practice Providers (APPs -  Physician Assistants and Nurse Practitioners) who all work together to provide you with the care you need, when you need it.  We recommend signing up for the patient portal called "MyChart".  Sign up information is provided on this After Visit Summary.  MyChart is used to connect with patients for Virtual Visits (Telemedicine).  Patients are able to view lab/test results, encounter notes, upcoming appointments, etc.  Non-urgent messages can be sent to your provider as well.   To learn more about what you can do with MyChart, go to [email or web address].    Other Instructions Keep Upcoming appointment with Dr. Claiborne Billings in November.   Important Information About Sugar

## 2021-11-14 ENCOUNTER — Encounter: Payer: Self-pay | Admitting: Physician Assistant

## 2021-12-21 ENCOUNTER — Other Ambulatory Visit: Payer: Medicare Other

## 2021-12-21 DIAGNOSIS — C61 Malignant neoplasm of prostate: Secondary | ICD-10-CM

## 2021-12-21 DIAGNOSIS — E291 Testicular hypofunction: Secondary | ICD-10-CM

## 2021-12-26 ENCOUNTER — Ambulatory Visit (INDEPENDENT_AMBULATORY_CARE_PROVIDER_SITE_OTHER): Payer: Medicare Other | Admitting: Urology

## 2021-12-26 ENCOUNTER — Encounter: Payer: Self-pay | Admitting: Urology

## 2021-12-26 VITALS — BP 159/96 | HR 74

## 2021-12-26 DIAGNOSIS — N138 Other obstructive and reflux uropathy: Secondary | ICD-10-CM | POA: Diagnosis not present

## 2021-12-26 DIAGNOSIS — I2510 Atherosclerotic heart disease of native coronary artery without angina pectoris: Secondary | ICD-10-CM | POA: Diagnosis not present

## 2021-12-26 DIAGNOSIS — N401 Enlarged prostate with lower urinary tract symptoms: Secondary | ICD-10-CM

## 2021-12-26 DIAGNOSIS — R351 Nocturia: Secondary | ICD-10-CM

## 2021-12-26 DIAGNOSIS — E291 Testicular hypofunction: Secondary | ICD-10-CM

## 2021-12-26 DIAGNOSIS — C61 Malignant neoplasm of prostate: Secondary | ICD-10-CM | POA: Diagnosis not present

## 2021-12-26 MED ORDER — ALFUZOSIN HCL ER 10 MG PO TB24: 10.0000 mg | ORAL_TABLET | Freq: Two times a day (BID) | ORAL | 11 refills | Status: DC

## 2021-12-26 MED ORDER — TESTOSTERONE CYPIONATE 200 MG/ML IM SOLN: 100.0000 mg | INTRAMUSCULAR | 3 refills | Status: DC

## 2021-12-26 NOTE — Progress Notes (Signed)
12/26/2021 2:29 PM   Stephen Walton 07-Oct-1955 [id number]  Referring provider: [facility] [address] [place],  [place] [address]  Followup BPH, prostate cancer and hypogonadism   HPI: Stephen Walton is a 32GM here for followup for BPH, prostate cancer and hypogonadism. Starting 3-4 week ago he noted left pelvic pain which is worse with standing. Testosterone 358, hemoglobin 14.7, PSA 5.3. He injects '100mg'$  testosterone weekly IPSS 14 QOL 2 on uroxatral '10mg'$  BID. Urine stream strong. Nocturia 1-2x. No straining to urinate.   PMH: Past Medical History:  Diagnosis Date   Chest pain, atypical    H/O echocardiogram 10/04/2011   EF >55%;mild concentric LVH; normals systolic & diastolic dysfunction; mild aortic stenosis   History of nuclear stress test 10/04/2011   low risk; small amount of basal inferior bowel artifact   Hyperlipidemia    OSA on CPAP     Surgical History: Past Surgical History:  Procedure Laterality Date   COLONOSCOPY N/A 11/14/2020   Procedure: COLONOSCOPY;  Surgeon: Aviva Signs, MD;  Location[facility];  Service: Gastroenterology;  Laterality: N/A;    Home Medications:  Allergies as of 12/26/2021       Reactions   No Known Allergies         Medication List        Accurate as of December 26, 2021  2:29 PM. If you have any questions, ask your nurse or doctor.          alfuzosin 10 MG 24 hr tablet Commonly known as: UROXATRAL Take 1 tablet (10 mg total) by mouth in the morning and at bedtime.   aspirin 81 MG tablet Take 81 mg by mouth daily.   buPROPion 300 MG 24 hr tablet Commonly known as: WELLBUTRIN XL Take 300 mg by mouth daily.   busPIRone 15 MG tablet Commonly known as: BUSPAR Take 15 mg by mouth 2 (two) times daily.   carvedilol 3.125 MG tablet Commonly known as: COREG Take 1 tablet (3.125 mg total) by mouth 2 (two) times daily.   fish oil-omega-3 fatty acids 1000 MG capsule Take 1 g by mouth  daily.   isosorbide mononitrate 30 MG 24 hr tablet Commonly known as: IMDUR Take 1 tablet (30 mg total) by mouth daily.   nitroGLYCERIN 0.4 MG SL tablet Commonly known as: NITROSTAT Place 1 tablet (0.4 mg total) under the tongue every 5 (five) minutes as needed for chest pain.   NON FORMULARY at bedtime. CPAP   rosuvastatin 20 MG tablet Commonly known as: CRESTOR Take 1 tablet (20 mg total) by mouth daily.   Saxenda 18 MG/3ML Sopn Generic drug: Liraglutide -Weight Management Inject 3 mg as directed daily.   testosterone cypionate 200 MG/ML injection Commonly known as: DEPOTESTOSTERONE CYPIONATE Inject 0.5 mLs (100 mg total) into the muscle every 7 (seven) days.   tiZANidine 4 MG tablet Commonly known as: Zanaflex Take 1 tablet (4 mg total) by mouth every 6 (six) hours as needed for muscle spasms. Do not take with alcohol or while driving or operating heavy machinery   valsartan 40 MG tablet Commonly known as: Diovan Take 1 tablet (40 mg total) by mouth 2 (two) times daily.        Allergies:  Allergies  Allergen Reactions   No Known Allergies     Family History: Family History  Problem Relation Age of Onset   Heart attack Brother     Social History:  reports that he has  quit smoking. He has never used smokeless tobacco. He reports current alcohol use. He reports that he does not use drugs.  ROS: All other review of systems were reviewed and are negative except what is noted above in HPI  Physical Exam: BP (!) 159/96   Pulse 74   Constitutional:  Alert and oriented, No acute distress. HEENT: [place] AT, moist mucus membranes.  Trachea midline, no masses. Cardiovascular: No clubbing, cyanosis, or edema. Respiratory: Normal respiratory effort, no increased work of breathing. GI: Abdomen is soft, nontender, nondistended, no abdominal masses GU: No CVA tenderness. Circumcised phallus. No masses/lesions in right or left testis. No inguinal hernia.  Lymph: No  cervical or inguinal lymphadenopathy. Skin: No rashes, bruises or suspicious lesions. Neurologic: Grossly intact, no focal deficits, moving all 4 extremities. Psychiatric: Normal mood and affect.  Laboratory Data: Lab Results  Component Value Date   WBC 6.2 10/20/2021   HGB 14.1 10/20/2021   HCT 46.7 10/20/2021   MCV 72.3 (L) 10/20/2021   PLT 314 10/20/2021    Lab Results  Component Value Date   CREATININE 1.00 10/20/2021    Lab Results  Component Value Date   PSA 4.0 10/21/2019   PSA 3.9 03/22/2019    Lab Results  Component Value Date   TESTOSTERONE 866 07/04/2021    No results found for: "HGBA1C"  Urinalysis    Component Value Date/Time   APPEARANCEUR Clear 07/11/2021 1633   GLUCOSEU Negative 07/11/2021 1633   BILIRUBINUR small (A) 10/29/2021 1542   BILIRUBINUR Negative 07/11/2021 1633   KETONESUR small (15) (A) 10/29/2021 1542   PROTEINUR =100 (A) 10/29/2021 1542   PROTEINUR 2+ (A) 07/11/2021 1633   UROBILINOGEN 1.0 10/29/2021 1542   NITRITE Negative 10/29/2021 1542   NITRITE Negative 07/11/2021 1633   LEUKOCYTESUR Negative 10/29/2021 1542   LEUKOCYTESUR Negative 07/11/2021 1633    Lab Results  Component Value Date   LABMICR See below: 07/11/2021   WBCUA None seen 07/11/2021   LABEPIT None seen 07/11/2021   MUCUS Present 07/11/2021   BACTERIA None seen 07/11/2021    Pertinent Imaging:  No results found for this or any previous visit.  No results found for this or any previous visit.  No results found for this or any previous visit.  No results found for this or any previous visit.  Results for orders placed during the hospital encounter of 04/24/15  US Renal  Narrative CLINICAL DATA:  Proteinuria  EXAM: RENAL / URINARY TRACT ULTRASOUND COMPLETE  COMPARISON:  None.  FINDINGS: Right Walton:  Length: 12.7 cm. Echogenicity within normal limits. No mass or hydronephrosis visualized.  Left Walton:  Length: 12.0 cm. Echogenicity  within normal limits. No mass or hydronephrosis visualized.  Bladder:  Appears normal for degree of bladder distention.  IMPRESSION: Normal renal ultrasound.   Electronically Signed By: Stephen Walton M.D. On: 04/24/2015 09:56  No valid procedures specified. No results found for this or any previous visit.  No results found for this or any previous visit.   Assessment & Plan:    1. Benign prostatic hyperplasia with urinary obstruction -continue uroxatral '10mg'$   - Urinalysis, Routine w reflex microscopic  2. Prostate cancer ([place]) -RTC 6 months with PSA  3. Hypogonadism male -continue IM testosterone '100mg'$  every 7 days  4. Nocturia Continue uroxatral '10mg'$  BID   No follow-ups on file.  Nicolette Bang, MD  [facility] [place]

## 2021-12-26 NOTE — Patient Instructions (Signed)

## 2021-12-27 LAB — MICROSCOPIC EXAMINATION
Bacteria, UA: NONE SEEN
Epithelial Cells (non renal): NONE SEEN /hpf (ref 0–10)

## 2021-12-27 LAB — URINALYSIS, ROUTINE W REFLEX MICROSCOPIC
Bilirubin, UA: NEGATIVE
Glucose, UA: NEGATIVE
Ketones, UA: NEGATIVE
Leukocytes,UA: NEGATIVE
Nitrite, UA: NEGATIVE
RBC, UA: NEGATIVE
Specific Gravity, UA: 1.02 (ref 1.005–1.030)
Urobilinogen, Ur: 1 mg/dL (ref 0.2–1.0)
pH, UA: 6.5 (ref 5.0–7.5)

## 2021-12-28 LAB — COMPREHENSIVE METABOLIC PANEL
ALT: 20 IU/L (ref 0–44)
AST: 19 IU/L (ref 0–40)
Albumin/Globulin Ratio: 1.8 (ref 1.2–2.2)
Albumin: 4.4 g/dL (ref 3.9–4.9)
Alkaline Phosphatase: 86 IU/L (ref 44–121)
BUN/Creatinine Ratio: 17 (ref 10–24)
BUN: 19 mg/dL (ref 8–27)
Bilirubin Total: 0.6 mg/dL (ref 0.0–1.2)
CO2: 23 mmol/L (ref 20–29)
Calcium: 9.7 mg/dL (ref 8.6–10.2)
Chloride: 102 mmol/L (ref 96–106)
Creatinine, Ser: 1.15 mg/dL (ref 0.76–1.27)
Globulin, Total: 2.4 g/dL (ref 1.5–4.5)
Glucose: 85 mg/dL (ref 70–99)
Potassium: 5.5 mmol/L — ABNORMAL HIGH (ref 3.5–5.2)
Sodium: 139 mmol/L (ref 134–144)
Total Protein: 6.8 g/dL (ref 6.0–8.5)
eGFR: 70 mL/min/{1.73_m2} (ref >59)

## 2021-12-28 LAB — CBC WITH DIFFERENTIAL
Basophils Absolute: 0.1 10*3/uL (ref 0.0–0.2)
Basos: 1 %
EOS (ABSOLUTE): 0.6 10*3/uL — ABNORMAL HIGH (ref 0.0–0.4)
Eos: 7 %
Hematocrit: 47.2 % (ref 37.5–51.0)
Hemoglobin: 14.7 g/dL (ref 13.0–17.7)
Immature Grans (Abs): 0 10*3/uL (ref 0.0–0.1)
Immature Granulocytes: 0 %
Lymphocytes Absolute: 2.1 10*3/uL (ref 0.7–3.1)
Lymphs: 24 %
MCH: 23.7 pg — ABNORMAL LOW (ref 26.6–33.0)
MCHC: 31.1 g/dL — ABNORMAL LOW (ref 31.5–35.7)
MCV: 76 fL — ABNORMAL LOW (ref 79–97)
Monocytes Absolute: 0.9 10*3/uL (ref 0.1–0.9)
Monocytes: 11 %
Neutrophils Absolute: 4.9 10*3/uL (ref 1.4–7.0)
Neutrophils: 57 %
RBC: 6.21 x10E6/uL — ABNORMAL HIGH (ref 4.14–5.80)
RDW: 20.6 % — ABNORMAL HIGH (ref 11.6–15.4)
WBC: 8.6 10*3/uL (ref 3.4–10.8)

## 2021-12-28 LAB — TESTOSTERONE,FREE AND TOTAL
Testosterone, Free: 7.2 pg/mL (ref 6.6–18.1)
Testosterone: 358 ng/dL (ref 264–916)

## 2021-12-28 LAB — PSA: Prostate Specific Ag, Serum: 5.3 ng/mL — ABNORMAL HIGH (ref 0.0–4.0)

## 2022-01-02 ENCOUNTER — Other Ambulatory Visit: Payer: Medicare Other

## 2022-01-09 ENCOUNTER — Ambulatory Visit: Payer: Medicare Other | Admitting: Urology

## 2022-02-08 ENCOUNTER — Other Ambulatory Visit: Payer: Self-pay

## 2022-02-08 ENCOUNTER — Observation Stay (HOSPITAL_COMMUNITY)
Admission: EM | Admit: 2022-02-08 | Discharge: 2022-02-09 | Disposition: A | Discharge status: Home / Self Care | Payer: Medicare Other | Attending: Internal Medicine | Admitting: Internal Medicine

## 2022-02-08 ENCOUNTER — Encounter (HOSPITAL_COMMUNITY): Payer: Self-pay | Admitting: Emergency Medicine

## 2022-02-08 ENCOUNTER — Emergency Department (HOSPITAL_COMMUNITY): Payer: Medicare Other

## 2022-02-08 DIAGNOSIS — Z6836 Body mass index (BMI) 36.0-36.9, adult: Secondary | ICD-10-CM | POA: Diagnosis not present

## 2022-02-08 DIAGNOSIS — E669 Obesity, unspecified: Secondary | ICD-10-CM | POA: Insufficient documentation

## 2022-02-08 DIAGNOSIS — Z7982 Long term (current) use of aspirin: Secondary | ICD-10-CM | POA: Insufficient documentation

## 2022-02-08 DIAGNOSIS — I200 Unstable angina: Secondary | ICD-10-CM

## 2022-02-08 DIAGNOSIS — G4733 Obstructive sleep apnea (adult) (pediatric): Secondary | ICD-10-CM | POA: Insufficient documentation

## 2022-02-08 DIAGNOSIS — Z87891 Personal history of nicotine dependence: Secondary | ICD-10-CM | POA: Insufficient documentation

## 2022-02-08 DIAGNOSIS — I110 Hypertensive heart disease with heart failure: Secondary | ICD-10-CM | POA: Diagnosis not present

## 2022-02-08 DIAGNOSIS — E291 Testicular hypofunction: Secondary | ICD-10-CM | POA: Diagnosis not present

## 2022-02-08 DIAGNOSIS — I25110 Atherosclerotic heart disease of native coronary artery with unstable angina pectoris: Secondary | ICD-10-CM | POA: Diagnosis not present

## 2022-02-08 DIAGNOSIS — R0789 Other chest pain: Secondary | ICD-10-CM | POA: Diagnosis present

## 2022-02-08 DIAGNOSIS — K219 Gastro-esophageal reflux disease without esophagitis: Secondary | ICD-10-CM | POA: Insufficient documentation

## 2022-02-08 DIAGNOSIS — N400 Enlarged prostate without lower urinary tract symptoms: Secondary | ICD-10-CM | POA: Insufficient documentation

## 2022-02-08 DIAGNOSIS — R079 Chest pain, unspecified: Secondary | ICD-10-CM | POA: Diagnosis present

## 2022-02-08 DIAGNOSIS — E785 Hyperlipidemia, unspecified: Secondary | ICD-10-CM | POA: Diagnosis not present

## 2022-02-08 DIAGNOSIS — Z79899 Other long term (current) drug therapy: Secondary | ICD-10-CM | POA: Insufficient documentation

## 2022-02-08 DIAGNOSIS — F32A Depression, unspecified: Secondary | ICD-10-CM | POA: Insufficient documentation

## 2022-02-08 DIAGNOSIS — I5020 Unspecified systolic (congestive) heart failure: Secondary | ICD-10-CM | POA: Insufficient documentation

## 2022-02-08 DIAGNOSIS — I10 Essential (primary) hypertension: Secondary | ICD-10-CM | POA: Diagnosis present

## 2022-02-08 DIAGNOSIS — E8881 Metabolic syndrome: Secondary | ICD-10-CM | POA: Insufficient documentation

## 2022-02-08 LAB — BASIC METABOLIC PANEL
Anion gap: 7 (ref 5–15)
BUN: 21 mg/dL (ref 8–23)
CO2: 25 mmol/L (ref 22–32)
Calcium: 9.3 mg/dL (ref 8.9–10.3)
Chloride: 104 mmol/L (ref 98–111)
Creatinine, Ser: 1.3 mg/dL — ABNORMAL HIGH (ref 0.61–1.24)
GFR, Estimated: >60 mL/min (ref >60)
Glucose, Bld: 95 mg/dL (ref 70–99)
Potassium: 5.1 mmol/L (ref 3.5–5.1)
Sodium: 136 mmol/L (ref 135–145)

## 2022-02-08 LAB — TROPONIN I (HIGH SENSITIVITY)
Troponin I (High Sensitivity): 6 ng/L (ref <18)
Troponin I (High Sensitivity): 4 ng/L (ref <18)

## 2022-02-08 LAB — CBC
HCT: 49.8 % (ref 39.0–52.0)
Hemoglobin: 15.7 g/dL (ref 13.0–17.0)
MCH: 24.8 pg — ABNORMAL LOW (ref 26.0–34.0)
MCHC: 31.5 g/dL (ref 30.0–36.0)
MCV: 78.5 fL — ABNORMAL LOW (ref 80.0–100.0)
Platelets: 334 10*3/uL (ref 150–400)
RBC: 6.34 MIL/uL — ABNORMAL HIGH (ref 4.22–5.81)
RDW: 20.7 % — ABNORMAL HIGH (ref 11.5–15.5)
WBC: 10.7 10*3/uL — ABNORMAL HIGH (ref 4.0–10.5)
nRBC: 0 % (ref 0.0–0.2)

## 2022-02-08 LAB — MRSA NEXT GEN BY PCR, NASAL: MRSA by PCR Next Gen: NOT DETECTED

## 2022-02-08 LAB — LIPID PANEL
Cholesterol: 118 mg/dL (ref 0–200)
HDL: 53 mg/dL (ref >40)
LDL Cholesterol: 53 mg/dL (ref 0–99)
Total CHOL/HDL Ratio: 2.2 RATIO
Triglycerides: 61 mg/dL (ref <150)
VLDL: 12 mg/dL (ref 0–40)

## 2022-02-08 MED ORDER — ALFUZOSIN HCL ER 10 MG PO TB24
10.0000 mg | ORAL_TABLET | Freq: Two times a day (BID) | ORAL | Status: DC
  Administered 2022-02-08 – 2022-02-09 (×3): 10 mg via ORAL
  Filled 2022-02-08 (×4): qty 1

## 2022-02-08 MED ORDER — ISOSORBIDE MONONITRATE ER 30 MG PO TB24
30.0000 mg | ORAL_TABLET | Freq: Every day | ORAL | Status: DC
  Administered 2022-02-08 – 2022-02-09 (×2): 30 mg via ORAL
  Filled 2022-02-08 (×2): qty 1

## 2022-02-08 MED ORDER — ACETAMINOPHEN 325 MG PO TABS: 650.0000 mg | ORAL_TABLET | ORAL | Status: DC | PRN

## 2022-02-08 MED ORDER — ROSUVASTATIN CALCIUM 20 MG PO TABS
20.0000 mg | ORAL_TABLET | Freq: Every evening | ORAL | Status: DC
  Administered 2022-02-08: 20 mg via ORAL
  Filled 2022-02-08: qty 1

## 2022-02-08 MED ORDER — IRBESARTAN 75 MG PO TABS
75.0000 mg | ORAL_TABLET | Freq: Two times a day (BID) | ORAL | Status: DC
  Filled 2022-02-08: qty 1

## 2022-02-08 MED ORDER — OMEGA-3-ACID ETHYL ESTERS 1 G PO CAPS
1.0000 g | ORAL_CAPSULE | Freq: Every day | ORAL | Status: DC
  Administered 2022-02-08 – 2022-02-09 (×2): 1 g via ORAL
  Filled 2022-02-08 (×2): qty 1

## 2022-02-08 MED ORDER — BUSPIRONE HCL 15 MG PO TABS
15.0000 mg | ORAL_TABLET | Freq: Two times a day (BID) | ORAL | Status: DC
  Administered 2022-02-08 – 2022-02-09 (×3): 15 mg via ORAL
  Filled 2022-02-08: qty 1
  Filled 2022-02-08: qty 3
  Filled 2022-02-08: qty 1

## 2022-02-08 MED ORDER — CARVEDILOL 3.125 MG PO TABS
3.1250 mg | ORAL_TABLET | Freq: Two times a day (BID) | ORAL | Status: DC
  Administered 2022-02-08 – 2022-02-09 (×3): 3.125 mg via ORAL
  Filled 2022-02-08 (×3): qty 1

## 2022-02-08 MED ORDER — BUPROPION HCL ER (XL) 150 MG PO TB24
300.0000 mg | ORAL_TABLET | Freq: Every day | ORAL | Status: DC
  Administered 2022-02-08 – 2022-02-09 (×2): 300 mg via ORAL
  Filled 2022-02-08 (×3): qty 2

## 2022-02-08 MED ORDER — ONDANSETRON HCL 4 MG/2ML IJ SOLN: 4.0000 mg | Freq: Four times a day (QID) | INTRAMUSCULAR | Status: DC | PRN

## 2022-02-08 MED ORDER — HYDROMORPHONE HCL 1 MG/ML IJ SOLN: 0.5000 mg | INTRAMUSCULAR | Status: DC | PRN

## 2022-02-08 MED ORDER — ENOXAPARIN SODIUM 40 MG/0.4ML IJ SOSY: 40.0000 mg | PREFILLED_SYRINGE | INTRAMUSCULAR | Status: DC

## 2022-02-08 MED ORDER — SODIUM CHLORIDE 0.9 % IV SOLN: INTRAVENOUS | Status: AC

## 2022-02-08 MED ORDER — NITROGLYCERIN 0.4 MG SL SUBL
0.4000 mg | SUBLINGUAL_TABLET | SUBLINGUAL | Status: DC | PRN
  Administered 2022-02-08 (×2): 0.4 mg via SUBLINGUAL
  Filled 2022-02-08: qty 1

## 2022-02-08 MED ORDER — MAGNESIUM HYDROXIDE 400 MG/5ML PO SUSP
30.0000 mL | Freq: Every day | ORAL | Status: DC | PRN
  Administered 2022-02-08: 30 mL via ORAL
  Filled 2022-02-08: qty 30

## 2022-02-08 MED ORDER — ASPIRIN 81 MG PO CHEW
81.0000 mg | CHEWABLE_TABLET | Freq: Every day | ORAL | Status: DC
  Administered 2022-02-08 – 2022-02-09 (×2): 81 mg via ORAL
  Filled 2022-02-08 (×2): qty 1

## 2022-02-08 MED ORDER — ENOXAPARIN SODIUM 60 MG/0.6ML IJ SOSY
55.0000 mg | PREFILLED_SYRINGE | INTRAMUSCULAR | Status: DC
  Administered 2022-02-08: 55 mg via SUBCUTANEOUS
  Filled 2022-02-08 (×2): qty 0.6

## 2022-02-08 MED ORDER — ASPIRIN 81 MG PO CHEW
324.0000 mg | CHEWABLE_TABLET | Freq: Once | ORAL | Status: AC
  Administered 2022-02-08: 324 mg via ORAL
  Filled 2022-02-08: qty 4

## 2022-02-08 MED ORDER — PANTOPRAZOLE SODIUM 40 MG PO TBEC
40.0000 mg | DELAYED_RELEASE_TABLET | Freq: Every day | ORAL | Status: DC
  Administered 2022-02-08 – 2022-02-09 (×2): 40 mg via ORAL
  Filled 2022-02-08 (×2): qty 1

## 2022-02-08 NOTE — ED Notes (Signed)
ED Provider at bedside. 

## 2022-02-08 NOTE — H&P (Signed)
History and Physical  [place] TRR:[id number] DOB: 1955/04/25 DOA: 02/08/2022  PCP: [facility] [place]  Patient coming from: Home  Level of care: Telemetry Cardiac  I have personally briefly reviewed patient's old medical records in [place]  Chief Complaint: Chest Pain   HPI: Stephen Walton is a 66 year old male with history of OSA, hyperlipidemia, chest pain, coronary artery disease, hypogonadism on testosterone replacement, BPH and seen by cardiology last in the office in August 2023.  He has been on carvedilol Entresto and Imdur.  Due to insurance issues he was not able to pay for [facility] and subsequently switched to valsartan 40 mg twice daily.  Cardiology has said that if he continues to have chest pain they would consider cardiac catheterization.  Patient presents to the emergency department today complaining of chest pain that started at around 1 AM.  He took a nitro at 2 AM.  He was describing his pain as a midsternal chest pain.  He has been having intermittent elevations in his blood pressure.  The pain is not radiating.  He says that it feels like a pressure pain.  There is no tearing or ripping sensation.  No pleuritic pain.  No fever or chills.  His EKG did not show acute changes.  His high-sensitivity troponins have been reassuring.  Due to having ongoing symptoms of chest pain he will be admitted to [facility] or [facility] for cardiology consultation as service not available at [facility] at this time.    Past Medical History:  Diagnosis Date   Chest pain, atypical    H/O echocardiogram 10/04/2011   EF >55%;mild concentric LVH; normals systolic & diastolic dysfunction; mild aortic stenosis   History of nuclear stress test 10/04/2011   low risk; small amount of basal inferior bowel artifact   Hyperlipidemia    OSA on CPAP     Past Surgical History:  Procedure Laterality Date   COLONOSCOPY N/A 11/14/2020   Procedure:  COLONOSCOPY;  Surgeon: Aviva Signs, MD;  Location[facility];  Service: Gastroenterology;  Laterality: N/A;     reports that he has quit smoking. He has never used smokeless tobacco. He reports current alcohol use. He reports that he does not use drugs.  No Known Allergies  Family History  Problem Relation Age of Onset   Heart attack Brother     Prior to Admission medications   Medication Sig Start Date End Date Taking? Authorizing Provider  alfuzosin (UROXATRAL) 10 MG 24 hr tablet Take 1 tablet (10 mg total) by mouth in the morning and at bedtime. 12/26/21  Yes McKenzie, Candee Furbish, MD  aspirin 81 MG tablet Take 81 mg by mouth daily.   Yes [provider]  buPROPion (WELLBUTRIN XL) 300 MG 24 hr tablet Take 300 mg by mouth daily. 08/18/18  Yes [provider]  busPIRone (BUSPAR) 15 MG tablet Take 15 mg by mouth at bedtime.   Yes [provider]  carvedilol (COREG) 3.125 MG tablet Take 1 tablet (3.125 mg total) by mouth 2 (two) times daily. 09/11/21  Yes Meng, Isaac Laud, PA  fish oil-omega-3 fatty acids 1000 MG capsule Take 1 g by mouth daily.   Yes [provider]  isosorbide mononitrate (IMDUR) 30 MG 24 hr tablet Take 1 tablet (30 mg total) by mouth daily. 10/22/21  Yes Cherene Altes, MD  nitroGLYCERIN (NITROSTAT) 0.4 MG SL tablet Place 1 tablet (0.4 mg total) under the tongue  every 5 (five) minutes as needed for chest pain. 08/23/21  Yes Almyra Deforest, PA  rosuvastatin (CRESTOR) 20 MG tablet Take 1 tablet (20 mg total) by mouth daily. 08/23/21  Yes Troy Sine, MD  testosterone cypionate (DEPOTESTOSTERONE CYPIONATE) 200 MG/ML injection Inject 0.5 mLs (100 mg total) into the muscle every 7 (seven) days. 12/26/21  Yes McKenzie, Candee Furbish, MD  valsartan (DIOVAN) 40 MG tablet Take 1 tablet (40 mg total) by mouth 2 (two) times daily. 11/12/21  Yes Almyra Deforest, PA  NON FORMULARY at bedtime. CPAP    [provider]    Physical Exam: Vitals:   02/08/22  0747 02/08/22 0830 02/08/22 0900 02/08/22 0930  BP: 124/82 (!) 154/93 (!) 153/89 (!) 140/88  Pulse: 80 79 86 82  Resp: '14 20 14 17  '$ Temp: 98.3 F (36.8 C)     TempSrc: Oral     SpO2: 96% 97% 98% 97%  Weight:      Height:        Constitutional: NAD, calm, comfortable Eyes: PERRL, lids and conjunctivae normal ENMT: Mucous membranes are moist. Posterior pharynx clear of any exudate or lesions.Normal dentition.  Neck: normal, supple, no masses, no thyromegaly Respiratory: clear to auscultation bilaterally, no wheezing, no crackles. Normal respiratory effort. No accessory muscle use.  Cardiovascular: normal s1, s2 sounds, no murmurs / rubs / gallops. No extremity edema. 2+ pedal pulses. No carotid bruits.  Abdomen: no tenderness, no masses palpated. No hepatosplenomegaly. Bowel sounds positive.  Musculoskeletal: no clubbing / cyanosis. No joint deformity upper and lower extremities. Good ROM, no contractures. Normal muscle tone.  Skin: multiple tattoos seen; no rashes, lesions, ulcers. No induration Neurologic: CN 2-12 grossly intact. Sensation intact, DTR normal. Strength 5/5 in all 4.  Psychiatric: Normal judgment and insight. Alert and oriented x 3. Normal mood.   Labs on Admission: I have personally reviewed following labs and imaging studies  CBC: Recent Labs  Lab 02/08/22 0433  WBC 10.7*  HGB 15.7  HCT 49.8  MCV 78.5*  PLT 740   Basic Metabolic Panel: Recent Labs  Lab 02/08/22 0433  NA 136  K 5.1  CL 104  CO2 25  GLUCOSE 95  BUN 21  CREATININE 1.30*  CALCIUM 9.3   GFR: Estimated Creatinine Clearance: 69.3 mL/min (A) (by C-G formula based on SCr of 1.3 mg/dL (H)). Liver Function Tests: No results for input(s): "AST", "ALT", "ALKPHOS", "BILITOT", "PROT", "ALBUMIN" in the last 168 hours. No results for input(s): "LIPASE", "AMYLASE" in the last 168 hours. No results for input(s): "AMMONIA" in the last 168 hours. Coagulation Profile: No results for input(s): "INR",  "PROTIME" in the last 168 hours. Cardiac Enzymes: No results for input(s): "CKTOTAL", "CKMB", "CKMBINDEX", "TROPONINI" in the last 168 hours. BNP (last 3 results) No results for input(s): "PROBNP" in the last 8760 hours. HbA1C: No results for input(s): "HGBA1C" in the last 72 hours. CBG: No results for input(s): "GLUCAP" in the last 168 hours. Lipid Profile: Recent Labs    02/08/22 0547  CHOL 118  HDL 53  LDLCALC 53  TRIG 61  CHOLHDL 2.2   Thyroid Function Tests: No results for input(s): "TSH", "T4TOTAL", "FREET4", "T3FREE", "THYROIDAB" in the last 72 hours. Anemia Panel: No results for input(s): "VITAMINB12", "FOLATE", "FERRITIN", "TIBC", "IRON", "RETICCTPCT" in the last 72 hours. Urine analysis:    Component Value Date/Time   APPEARANCEUR Clear 12/26/2021 1436   GLUCOSEU Negative 12/26/2021 1436   BILIRUBINUR Negative 12/26/2021 1436   KETONESUR small (15) (  A) 10/29/2021 1542   PROTEINUR 1+ (A) 12/26/2021 1436   UROBILINOGEN 1.0 10/29/2021 1542   NITRITE Negative 12/26/2021 1436   LEUKOCYTESUR Negative 12/26/2021 1436    Radiological Exams on Admission: DG Chest Port 1 View  Result Date: 02/08/2022 CLINICAL DATA:  66 year old male with chest pain onset 0130 hours. EXAM: PORTABLE CHEST 1 VIEW COMPARISON:  Portable chest 10/20/2021 and earlier. FINDINGS: Portable AP upright view at 0409 hours. Lower lung volumes. Mild diffuse crowding of lung markings now. Mediastinal contours are stable and within normal limits. Visualized tracheal air column is within normal limits. No pneumothorax. No convincing pulmonary edema or acute pulmonary opacity. No acute osseous abnormality identified. Negative visible bowel gas. IMPRESSION: Lower lung volumes, otherwise no acute cardiopulmonary abnormality. Electronically Signed   By: Genevie Ann M.D.   On: 02/08/2022 04:25    EKG: Independently reviewed. Sinus, no acute ST-T wave changes seen.   Assessment/Plan Principal Problem:   Chest  pain Active Problems:   Obstructive sleep apnea on CPAP   Atypical chest pain   Obesity   Metabolic syndrome   Hyperlipidemia   Hypogonadism male   BPH (benign prostatic hyperplasia)   Depression   Benign essential HTN   GERD (gastroesophageal reflux disease)   Systolic HF (heart failure) (HCC)   Unstable angina (HCC)   Chest Pain / [place] - Pt presents with recurrent chest pain / pressure and evidence of CAD on his prior coronary CT - initial work-up has been reassuring - Following trend HS troponin - continue supportive measures  - resume home cardiac medications - Admit to [facility] for inpatient cardiology consultation for further recommendations - consult cardi[facility]  - IV pain meds ordered if needed - gentle hydration overnight given mild bump in creatinine - repeat EKG for recurrence of CP   OSA - will offer nightly CPAP  Hyperlipidemia - check fasting lipids - continue rosuvastatin daily   GERD - protonix ordered for GI protection   HFrEF - improved with recent TTE - continue carvedilol - entresto was changed outpatient to valsartan 40 mg BID  BPH - resume home meds, no current symptoms reported  Hypogonadism  - he is on supplemental testosterone injections  DVT prophylaxis: enoxaparin  Code Status: Full   Family Communication: wife at bedside updated   Disposition Plan: admit to [place] called:   Admission status: OBS  Level of care: Telemetry Cardiac Irwin Brakeman MD [facility] How to contact the [facility] Attending or Consulting provider 7A - 7P or covering provider during after hours 7P -7A, for this patient?  Check the care team in [facility] and look for a) attending/consulting TR[facility] provider listed and b) the [facility] team listed Log into [email or web address] and use [place]'s universal password to access. If you do not have the password, please contact the hospital operator. Locate the [facility] provider you are looking for under [facility] and page to a number that you can be directly reached. If you still have difficulty reaching the provider, please page the [facility] (Director on Call) for the Hospitalists listed on amion for assistance.   If 7PM-7AM, please contact night-coverage [email or web address] Password [facility]  02/08/2022, 10:19 AM

## 2022-02-08 NOTE — ED Triage Notes (Signed)
Pt arrives POV c/o chest pain that started about 0130, pt took 1 nitro at 0210. Pt states mid sternal chest pain has continued, pt ttok 2nd nitro at 0240. Pt states he kept checking his BP at home and noticed that it was continuing to increase.

## 2022-02-08 NOTE — ED Notes (Signed)
EKG completed and given to Dr. Wickline 

## 2022-02-08 NOTE — Hospital Course (Addendum)
66 year old male with history of OSA, hyperlipidemia, chest pain, coronary artery disease, hypogonadism on testosterone replacement, BPH and seen by cardiology last in the office in August 2023.  He has been on carvedilol Entresto and Imdur.  Due to insurance issues he was not able to pay for [facility] and subsequently switched to valsartan 40 mg twice daily.  Cardiology has said that if he continues to have chest pain they would consider cardiac catheterization.  Patient presents to the emergency department today complaining of chest pain that started at around 1 AM.  He took a nitro at 2 AM.  He was describing his pain as a midsternal chest pain.  He has been having intermittent elevations in his blood pressure.  The pain is not radiating.  He says that it feels like a pressure pain.  There is no tearing or ripping sensation.  No pleuritic pain.  No fever or chills.  His EKG did not show acute changes.  His high-sensitivity troponins have been reassuring.  Due to having ongoing symptoms of chest pain he will be admitted to [facility] or [facility] for cardiology consultation as service not available at [facility] at this time.

## 2022-02-08 NOTE — ED Provider Notes (Signed)
[facility] EMERGENCY DEPARTMENT Provider Note   CSN: [id number] Arrival date & time: 02/08/22  0349     History  Chief Complaint  Patient presents with   Chest Pain    Stephen Walton is a 66 y.o. male.  The history is provided by the patient and the spouse.  Chest Pain Associated symptoms: shortness of breath   Associated symptoms: no diaphoresis, no fever, no numbness and no weakness    Patient with history of mild CHF, hypertension presents with chest pain.  Patient reports he woke up and started noticing chest pressure.  He checked his blood pressure and it was significantly elevated.  He took at least 2 nitroglycerin without any relief.  He reports mild shortness of breath.  The pain felt like pressure it was not radiating, no tearing or ripping sensation.  He reports brief sharp episode of pain but no pleuritic pain. No fevers or vomiting. He reports his been seen by cardiology before and he was informed he may have a "blockage "  No arm or leg weakness.   Past Medical History:  Diagnosis Date   Chest pain, atypical    H/O echocardiogram 10/04/2011   EF >55%;mild concentric LVH; normals systolic & diastolic dysfunction; mild aortic stenosis   History of nuclear stress test 10/04/2011   low risk; small amount of basal inferior bowel artifact   Hyperlipidemia    OSA on CPAP     Home Medications Prior to Admission medications   Medication Sig Start Date End Date Taking? Authorizing Provider  alfuzosin (UROXATRAL) 10 MG 24 hr tablet Take 1 tablet (10 mg total) by mouth in the morning and at bedtime. 12/26/21   McKenzie, Candee Furbish, MD  aspirin 81 MG tablet Take 81 mg by mouth daily.    [provider]  buPROPion (WELLBUTRIN XL) 300 MG 24 hr tablet Take 300 mg by mouth daily. 08/18/18   [provider]  busPIRone (BUSPAR) 15 MG tablet Take 15 mg by mouth 2 (two) times daily.    [provider]  carvedilol (COREG) 3.125 MG tablet Take 1 tablet  (3.125 mg total) by mouth 2 (two) times daily. 09/11/21   Almyra Deforest, PA  fish oil-omega-3 fatty acids 1000 MG capsule Take 1 g by mouth daily.    [provider]  isosorbide mononitrate (IMDUR) 30 MG 24 hr tablet Take 1 tablet (30 mg total) by mouth daily. 10/22/21   Cherene Altes, MD  Liraglutide -Weight Management (SAXENDA) 18 MG/3ML SOPN Inject 3 mg as directed daily. 07/14/18   [provider]  nitroGLYCERIN (NITROSTAT) 0.4 MG SL tablet Place 1 tablet (0.4 mg total) under the tongue every 5 (five) minutes as needed for chest pain. 08/23/21   Almyra Deforest, PA  NON FORMULARY at bedtime. CPAP    [provider]  rosuvastatin (CRESTOR) 20 MG tablet Take 1 tablet (20 mg total) by mouth daily. 08/23/21   Troy Sine, MD  testosterone cypionate (DEPOTESTOSTERONE CYPIONATE) 200 MG/ML injection Inject 0.5 mLs (100 mg total) into the muscle every 7 (seven) days. 12/26/21   McKenzie, Candee Furbish, MD  tiZANidine (ZANAFLEX) 4 MG tablet Take 1 tablet (4 mg total) by mouth every 6 (six) hours as needed for muscle spasms. Do not take with alcohol or while driving or operating heavy machinery 10/29/21   Eulogio Bear, NP  valsartan (DIOVAN) 40 MG tablet Take 1 tablet (40 mg total) by mouth 2 (two) times daily. 11/12/21  Almyra Deforest, [place]      Allergies    No known allergies    Review of Systems   Review of Systems  Constitutional:  Negative for diaphoresis and fever.  Respiratory:  Positive for shortness of breath.   Cardiovascular:  Positive for chest pain.  Neurological:  Negative for weakness and numbness.    Physical Exam Updated Vital Signs BP (!) 143/102   Pulse 83   Temp 97.8 F (36.6 C) (Oral)   Resp 18   Ht 1.753 m ('5\' 9"'$ )   Wt 113 kg   SpO2 97%   BMI 36.79 kg/m  Physical Exam CONSTITUTIONAL: Well developed/well nourished, appears anxious HEAD: Normocephalic/atraumatic EYES: EOMI ENMT: Mucous membranes moist NECK: supple no meningeal signs CV: S1/S2 noted,  no murmurs/rubs/gallops noted LUNGS: Lungs are clear to auscultation bilaterally, no apparent distress ABDOMEN: soft, nontender, no rebound or guarding, bowel sounds noted throughout abdomen GU:no cva tenderness NEURO: Pt is awake/alert/appropriate, moves all extremitiesx4.  No facial droop.   EXTREMITIES: pulses normal/equalx4, full ROM, no calf tenderness SKIN: warm, color normal PSYCH: no abnormalities of mood noted, alert and oriented to situation  ED Results / Procedures / Treatments   Labs (all labs ordered are listed, but only abnormal results are displayed) Labs Reviewed  BASIC METABOLIC PANEL - Abnormal; Notable for the following components:      Result Value   Creatinine, Ser 1.30 (*)    All other components within normal limits  CBC - Abnormal; Notable for the following components:   WBC 10.7 (*)    RBC 6.34 (*)    MCV 78.5 (*)    MCH 24.8 (*)    RDW 20.7 (*)    All other components within normal limits  TROPONIN I (HIGH SENSITIVITY)  TROPONIN I (HIGH SENSITIVITY)    EKG EKG Interpretation  Date/Time:  Friday February 08 2022 04:00:33 EST Ventricular Rate:  82 PR Interval:  179 QRS Duration: 116 QT Interval:  373 QTC Calculation: 436 R Axis:   95 Text Interpretation: Sinus rhythm Incomplete right bundle branch block No significant change since last tracing Confirmed by Ripley Fraise [phone]) on 02/08/2022 4:11:53 AM  Radiology DG Chest Port 1 View  Result Date: 02/08/2022 CLINICAL DATA:  66 year old male with chest pain onset 0130 hours. EXAM: PORTABLE CHEST 1 VIEW COMPARISON:  Portable chest 10/20/2021 and earlier. FINDINGS: Portable AP upright view at 0409 hours. Lower lung volumes. Mild diffuse crowding of lung markings now. Mediastinal contours are stable and within normal limits. Visualized tracheal air column is within normal limits. No pneumothorax. No convincing pulmonary edema or acute pulmonary opacity. No acute osseous abnormality identified.  Negative visible bowel gas. IMPRESSION: Lower lung volumes, otherwise no acute cardiopulmonary abnormality. Electronically Signed   By: Genevie Ann M.D.   On: 02/08/2022 04:25    Procedures Procedures    Medications Ordered in ED Medications  nitroGLYCERIN (NITROSTAT) SL tablet 0.4 mg (0.4 mg Sublingual Given 02/08/22 0557)  aspirin chewable tablet 324 mg (324 mg Oral Given 02/08/22 0427)    ED Course/ Medical Decision Making/ A&P Clinical Course as of 02/08/22 4431  Fri Feb 08, 2022  0518 Patient with previous history of chest pain, but reports this is the worst pain has had.  Is now improved with nitro.  Per cardiology records, they recommended further evaluation including cardiac cath if his chest pain recurs.  Patient is overall feeling improved, no acute ST changes.  Labs are pending at this time [DW]  [phone]  Creatinine(!): 1.30 Mild renal insufficiency [DW]  0621 Patient improving, but does still have mild chest pressure.  Initial troponin negative.  However previous cardiology evaluation revealed mild CAD and they recommended cardiac cath if pain  Returns [DW]  0622 Discussed with Dr. Josph Macho with [facility] for admission [DW]    Clinical Course User Index [DW] Ripley Fraise, MD                           Medical Decision Making Amount and/or Complexity of Data Reviewed Labs: ordered. Decision-making details documented in ED Course.  Risk OTC drugs. Prescription drug management. Decision regarding hospitalization.   This patient presents to the ED for concern of chest pain, this involves an extensive number of treatment options, and is a complaint that carries with it a high risk of complications and morbidity.  The differential diagnosis includes but is not limited to acute coronary syndrome, aortic dissection, pulmonary embolism, pericarditis, pneumothorax, pneumonia, myocarditis, pleurisy, esophageal rupture    Comorbidities that complicate the patient evaluation: Patient's  presentation is complicated by their history of obesity, hyperlipidemia, mild CHF    Additional history obtained: Additional history obtained from spouse Records reviewed previous admission documents and cardiology records  Lab Tests: I Ordered, and personally interpreted labs.  The pertinent results include:  renal insufficiency  Imaging Studies ordered: I ordered imaging studies including X-ray chest   I independently visualized and interpreted imaging which showed no acute findings I agree with the radiologist interpretation  Cardiac Monitoring: The patient was maintained on a cardiac monitor.  I personally viewed and interpreted the cardiac monitor which showed an underlying rhythm of:  sinus rhythm  Medicines ordered and prescription drug management: I ordered medication including aspirin and nitroglycerin for chest pain Reevaluation of the patient after these medicines showed that the patient    improved   Critical Interventions:  aspirin, NTG  Consultations Obtained: I requested consultation with the admitting physician [facility] , and discussed  findings as well as pertinent plan - they recommend: will admit, will need cardiology consult  Reevaluation: After the interventions noted above, I reevaluated the patient and found that they have :improved  Complexity of problems addressed: Patient's presentation is most consistent with  acute presentation with potential threat to life or bodily function  Disposition: After consideration of the diagnostic results and the patient's response to treatment,  I feel that the patent would benefit from admission   .    Patient reports pain is similar to previous episodes of angina.  Given his overall improvement, I have low suspicion for PE or aortic dissection at this time.  Patient will be admitted for unstable angina        Final Clinical Impression(s) / ED Diagnoses Final diagnoses:  Unstable angina [facility])    Rx / DC  Orders ED Discharge Orders     None         Ripley Fraise, MD 02/08/22 [phone]

## 2022-02-08 NOTE — ED Notes (Signed)
Breakfast tray given. °

## 2022-02-08 NOTE — TOC Progression Note (Signed)
  Transition of Care [facility]) Screening Note   Patient Details  Name: JAMORION GOMILLION Date of Birth: 14-May-1955   Transition of Care [facility]) CM/SW Contact:    Shade Flood, LCSW Phone Number: 02/08/2022, 10:06 AM    Transition of Care Department [facility]) has reviewed patient and no TOC needs have been identified at this time. We will continue to monitor patient advancement through interdisciplinary progression rounds. If new patient transition needs arise, please place a TOC consult.

## 2022-02-08 NOTE — Progress Notes (Signed)
Patient's home CPAP setup for him and ready for use. Plugged into red outlet. Patient states he can get on by himself.

## 2022-02-08 NOTE — Plan of Care (Signed)

## 2022-02-08 NOTE — ED Notes (Signed)
[phone]  [phone] 

## 2022-02-09 DIAGNOSIS — I25118 Atherosclerotic heart disease of native coronary artery with other forms of angina pectoris: Secondary | ICD-10-CM

## 2022-02-09 DIAGNOSIS — I10 Essential (primary) hypertension: Secondary | ICD-10-CM

## 2022-02-09 DIAGNOSIS — R072 Precordial pain: Secondary | ICD-10-CM | POA: Diagnosis not present

## 2022-02-09 DIAGNOSIS — I25110 Atherosclerotic heart disease of native coronary artery with unstable angina pectoris: Secondary | ICD-10-CM | POA: Diagnosis not present

## 2022-02-09 DIAGNOSIS — R0789 Other chest pain: Secondary | ICD-10-CM | POA: Diagnosis not present

## 2022-02-09 LAB — BASIC METABOLIC PANEL
Anion gap: 9 (ref 5–15)
BUN: 19 mg/dL (ref 8–23)
CO2: 28 mmol/L (ref 22–32)
Calcium: 9.4 mg/dL (ref 8.9–10.3)
Chloride: 103 mmol/L (ref 98–111)
Creatinine, Ser: 1.4 mg/dL — ABNORMAL HIGH (ref 0.61–1.24)
GFR, Estimated: 55 mL/min — ABNORMAL LOW (ref >60)
Glucose, Bld: 104 mg/dL — ABNORMAL HIGH (ref 70–99)
Potassium: 4.9 mmol/L (ref 3.5–5.1)
Sodium: 140 mmol/L (ref 135–145)

## 2022-02-09 MED ORDER — CARVEDILOL 6.25 MG PO TABS: 6.2500 mg | ORAL_TABLET | Freq: Two times a day (BID) | ORAL | 0 refills | Status: DC

## 2022-02-09 MED ORDER — CARVEDILOL 6.25 MG PO TABS: 6.2500 mg | ORAL_TABLET | Freq: Two times a day (BID) | ORAL | Status: DC

## 2022-02-09 NOTE — Consult Note (Addendum)
Cardiology Consultation   Patient ID: Stephen Walton MRN: [id number]; DOB: 19-Nov-1955  Admit date: 02/08/2022 Date of Consult: 02/09/2022  PCP:  [place], [place] [facility] Cardiologist:  Shelva Majestic, MD        Patient Profile:   Stephen Walton is a 66 y.o. male with a hx of OSA on CPAP, obesity, hyperlipidemia and recently diagnosed CAD who is being seen 02/09/2022 for the evaluation of chest pain at the request of Dr. Sloan Leiter.  History of Present Illness:   Stephen Walton is a pleasant 66 year old male with past medical history of OSA on CPAP, obesity, hyperlipidemia and recently diagnosed CAD.  Patient was previously evaluated for chest pain shortly after his brother had a cardiac arrest.  Echocardiogram obtained at the time showed moderate LVH, normal systolic and diastolic function.  Myoview obtained in July 2013 showed normal perfusion.  I saw the patient for the first time in June 2023 for evaluation of precordial chest pain.  4 days prior to the visit, he had prolonged chest pain that lasted the entire day.  We obtained a coronary CT that showed coronary calcium score 70.6 which placed the patient at 47th percentile for age and sex matched control, mild atherosclerosis of LAD and left circumflex.  Echocardiogram obtained on 09/04/2021 showed EF 40 to 45%, trivial MR.  I discussed with the patient medical therapy versus definitive cardiac catheterization, we eventually decided on a trial of medical therapy first.  I started him on carvedilol and Entresto.  Unfortunately he was admitted to the hospital in August 2023 with chest pain and was seen by Dr. Margaretann Loveless who had independently reviewed at the previous coronary CT and echocardiogram.  It was felt that his EF was low normal at 50 to 55% on echocardiogram and he has at least moderate disease 50-69% in the left circumflex artery with nonhemodynamically significant CT FFR assessment.  Dr. Margaretann Loveless  felt his chest pain was more related to blood pressure spikes.  Imdur was added during the hospitalization.  However if the patient continued to have chest pain, it was recommended to consider coronary angiography with PCI.  I last saw the patient on 11/13/2021, he was unable to get medication assistance for Entresto, therefore we decided to switch him to valsartan 40 mg twice a day dosing.  Since the last visit, he has been doing good without any recurrent chest pain.  His blood pressure on the valsartan has been borderline in the 130s.  However he will occasionally does have spikes in the blood pressure up to the 150s range.  He was in his usual state of health until around 1:30 AM on 02/08/2022.  He woke up from sleep with substernal chest pressure.  He checked his blood pressure which was 145/95.  He took a single nitroglycerin and his systolic blood pressure went down to 107 mmHg. Due to persistent chest pain, he took a second nitroglycerin and his blood pressure spiked.  He eventually sought medical attention at [facility].  On arrival his blood pressure was 210/111.  He was given a third nitroglycerin and his blood pressure subsequently improved.  Interestingly, his blood pressure normalized without further intervention.  Serial troponin was negative x 2.  EKG shows no acute changes.  In total, his chest pain lasted roughly an hour before going away.  Cholesterol was very well-controlled.  White blood cell count 10.7, however patient recently underwent steroid injection in the hip.  Past Medical History:  Diagnosis Date   Chest pain, atypical    H/O echocardiogram 10/04/2011   EF >55%;mild concentric LVH; normals systolic & diastolic dysfunction; mild aortic stenosis   History of nuclear stress test 10/04/2011   low risk; small amount of basal inferior bowel artifact   Hyperlipidemia    OSA on CPAP     Past Surgical History:  Procedure Laterality Date   COLONOSCOPY N/A 11/14/2020    Procedure: COLONOSCOPY;  Surgeon: Aviva Signs, MD;  Location[facility];  Service: Gastroenterology;  Laterality: N/A;     Home Medications:  Prior to Admission medications   Medication Sig Start Date End Date Taking? Authorizing Provider  alfuzosin (UROXATRAL) 10 MG 24 hr tablet Take 1 tablet (10 mg total) by mouth in the morning and at bedtime. 12/26/21  Yes McKenzie, Candee Furbish, MD  aspirin 81 MG tablet Take 81 mg by mouth daily.   Yes [provider]  buPROPion (WELLBUTRIN XL) 300 MG 24 hr tablet Take 300 mg by mouth daily. 08/18/18  Yes [provider]  busPIRone (BUSPAR) 15 MG tablet Take 15 mg by mouth at bedtime.   Yes [provider]  carvedilol (COREG) 3.125 MG tablet Take 1 tablet (3.125 mg total) by mouth 2 (two) times daily. 09/11/21  Yes Meng, Isaac Laud, PA  fish oil-omega-3 fatty acids 1000 MG capsule Take 1 g by mouth daily.   Yes [provider]  isosorbide mononitrate (IMDUR) 30 MG 24 hr tablet Take 1 tablet (30 mg total) by mouth daily. 10/22/21  Yes Cherene Altes, MD  nitroGLYCERIN (NITROSTAT) 0.4 MG SL tablet Place 1 tablet (0.4 mg total) under the tongue every 5 (five) minutes as needed for chest pain. 08/23/21  Yes Almyra Deforest, PA  rosuvastatin (CRESTOR) 20 MG tablet Take 1 tablet (20 mg total) by mouth daily. 08/23/21  Yes Troy Sine, MD  testosterone cypionate (DEPOTESTOSTERONE CYPIONATE) 200 MG/ML injection Inject 0.5 mLs (100 mg total) into the muscle every 7 (seven) days. 12/26/21  Yes McKenzie, Candee Furbish, MD  valsartan (DIOVAN) 40 MG tablet Take 1 tablet (40 mg total) by mouth 2 (two) times daily. 11/12/21  Yes Almyra Deforest, PA  NON FORMULARY at bedtime. CPAP    [provider]    Inpatient Medications: Scheduled Meds:  alfuzosin  10 mg Oral BID   aspirin  81 mg Oral Daily   buPROPion  300 mg Oral Daily   busPIRone  15 mg Oral BID   carvedilol  3.125 mg Oral BID WC   enoxaparin (LOVENOX) injection  55 mg Subcutaneous Q24H    isosorbide mononitrate  30 mg Oral Daily   omega-3 acid ethyl esters  1 g Oral Daily   pantoprazole  40 mg Oral Q0600   rosuvastatin  20 mg Oral QPM   Continuous Infusions:  PRN Meds: acetaminophen, HYDROmorphone (DILAUDID) injection, magnesium hydroxide, nitroGLYCERIN, ondansetron (ZOFRAN) IV  Allergies:   No Known Allergies  Social History:   Social History   Socioeconomic History   Marital status: Married    Spouse name: Not on file   Number of children: 4   Years of education: Not on file   Highest education level: Not on file  Occupational History   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  Vaping Use   Vaping Use:  Never used  Substance and Sexual Activity   Alcohol use: Yes    Comment: 1-2 beers per month   Drug use: Never   Sexual activity: Not on file  Other Topics Concern   Not on file  Social History Narrative   Not on file   Social Determinants of Health   Financial Resource Strain: Not on file  Food Insecurity: Not on file  Transportation Needs: Not on file  Physical Activity: Not on file  Stress: Not on file  Social Connections: Not on file  Intimate Partner Violence: Not on file    Family History:    Family History  Problem Relation Age of Onset   Heart attack Brother      ROS:  Please see the history of present illness.   All other ROS reviewed and negative.     Physical Exam/Data:   Vitals:   02/08/22 2000 02/09/22 0019 02/09/22 0452 02/09/22 0741  BP: 125/83 119/77 111/72 128/87  Pulse: 76 68 68 73  Resp: '14 12 16 19  '$ Temp: 97.9 F (36.6 C) 97.8 F (36.6 C) 98.1 F (36.7 C) 98.3 F (36.8 C)  TempSrc: Oral Oral Oral Oral  SpO2: 96% 96% 94% 98%  Weight:      Height:        Intake/Output Summary (Last 24 hours) at 02/09/2022 0817 Last data filed at 02/09/2022 0322 Gross per 24 hour  Intake 634.4 ml   Output 380 ml  Net 254.4 ml      02/08/2022    6:50 PM 02/08/2022    4:00 AM 11/12/2021    2:16 PM  Last 3 Weights  Weight (lbs) 246 lb 14.6 oz 249 lb 1.9 oz 249 lb 3.2 oz  Weight (kg) 112 kg 113 kg 113.036 kg     Body mass index is 36.46 kg/m.  General:  Well nourished, well developed, in no acute distress HEENT: normal Neck: no JVD Vascular: No carotid bruits; Distal pulses 2+ bilaterally Cardiac:  normal S1, S2; RRR; no murmur  Lungs:  clear to auscultation bilaterally, no wheezing, rhonchi or rales  Abd: soft, nontender, no hepatomegaly  Ext: no edema Musculoskeletal:  No deformities, BUE and BLE strength normal and equal Skin: warm and dry  Neuro:  CNs 2-12 intact, no focal abnormalities noted Psych:  Normal affect   EKG:  The EKG was personally reviewed and demonstrates: Normal sinus rhythm, no significant ST-T wave changes Telemetry:  Telemetry was personally reviewed and demonstrates: Normal sinus rhythm, no significant ventricular ectopy.  Relevant CV Studies:  Echo 09/04/2021  1. Left ventricular ejection fraction, by estimation, is 40 to 45%. The  left ventricle has mildly decreased function. The left ventricle has no  regional wall motion abnormalities. There is mild left ventricular  hypertrophy. Left ventricular diastolic  parameters were normal. The average left ventricular global longitudinal  strain is -13.7 %. The global longitudinal strain is abnormal.   2. Right ventricular systolic function is normal. The right ventricular  size is normal.   3. The mitral valve is normal in structure. Trivial mitral valve  regurgitation.   4. The aortic valve is normal in structure. Aortic valve regurgitation is  not visualized. No aortic stenosis is present.   5. The inferior vena cava is normal in size with greater than 50%  respiratory variability, suggesting right atrial pressure of 3 mmHg.   Laboratory Data:  High Sensitivity Troponin:   Recent Labs  Lab  02/08/22 0433 02/08/22 0547  TROPONINIHS 6 4     Chemistry Recent Labs  Lab 02/08/22 0433 02/09/22 0022  NA 136 140  K 5.1 4.9  CL 104 103  CO2 25 28  GLUCOSE 95 104*  BUN 21 19  CREATININE 1.30* 1.40*  CALCIUM 9.3 9.4  GFRNONAA >60 55*  ANIONGAP 7 9    No results for input(s): "PROT", "ALBUMIN", "AST", "ALT", "ALKPHOS", "BILITOT" in the last 168 hours. Lipids  Recent Labs  Lab 02/08/22 0547  CHOL 118  TRIG 61  HDL 53  LDLCALC 53  CHOLHDL 2.2    Hematology Recent Labs  Lab 02/08/22 0433  WBC 10.7*  RBC 6.34*  HGB 15.7  HCT 49.8  MCV 78.5*  MCH 24.8*  MCHC 31.5  RDW 20.7*  PLT 334   Thyroid No results for input(s): "TSH", "FREET4" in the last 168 hours.  BNPNo results for input(s): "BNP", "PROBNP" in the last 168 hours.  DDimer No results for input(s): "DDIMER" in the last 168 hours.   Radiology/Studies:  DG Chest Port 1 View  Result Date: 02/08/2022 CLINICAL DATA:  66 year old male with chest pain onset 0130 hours. EXAM: PORTABLE CHEST 1 VIEW COMPARISON:  Portable chest 10/20/2021 and earlier. FINDINGS: Portable AP upright view at 0409 hours. Lower lung volumes. Mild diffuse crowding of lung markings now. Mediastinal contours are stable and within normal limits. Visualized tracheal air column is within normal limits. No pneumothorax. No convincing pulmonary edema or acute pulmonary opacity. No acute osseous abnormality identified. Negative visible bowel gas. IMPRESSION: Lower lung volumes, otherwise no acute cardiopulmonary abnormality. Electronically Signed   By: Genevie Ann M.D.   On: 02/08/2022 04:25     Assessment and Plan:   Chest pain with blood pressure spikes  -Blood pressure 220/111 on arrival, high blood pressure self resolved without additional medication.  He is currently maintained on home blood pressure medication and his systolic blood pressure has been in the 110s to 120s.  Serial troponin negative despite 1 hour of chest pain.  -Will discuss  with MD regarding either continue observation versus cardiac catheterization for definitive evaluation.  Patient does have known moderate disease in the left circumflex vessel  CAD: I last saw the patient in June 2023 for evaluation of chest pain, coronary CT showed mild CAD, however image was independently reviewed by Dr. Margaretann Loveless in August who felt patient has at least moderate disease in the left circumflex vessel.  Hyperlipidemia: Well-controlled on current therapy.  Obstructive sleep apnea on CPAP therapy   Risk Assessment/Risk Scores:     TIMI Risk Score for Unstable Angina or Non-ST Elevation MI:   The patient's TIMI risk score is 4, which indicates a 20% risk of all cause mortality, new or recurrent myocardial infarction or need for urgent revascularization in the next 14 days.          For questions or updates, please contact [place] Please consult [email or web address] for contact info under    Signed, Almyra Deforest, [place]  02/09/2022 8:17 AM  I have seen and examined the patient along with Almyra Deforest, PA .  I have reviewed the chart, notes and new data.  I agree with PA/NP's note.  Key new complaints: Currently asymptomatic.  Chest pain woke him from sleep, but then he was mostly worried about the increasing blood pressure which made him go to the emergency room. Key examination changes: Obese, but also appears muscular and fit.  Otherwise normal cardiovascular exam. Key new findings / data: Normal  cardiac enzymes and no ischemic changes on ECG despite protracted pain.  Moderate nonobstructive CAD on CT. I have reviewed his echo and agree that the ejection fraction was underestimated.  It is probably right around 55%.  PLAN: He has never had overt congestive heart failure despite the reportedly low EF.  I think the priority here is to prevent the adrenergic surges that are causing his symptomatic episodes.  Recommend increasing his carvedilol and stopping the valsartan, which  is in a tiny dose anyway. He has not been taking his isosorbide mononitrate for months.  It gave him a headache and made him dizzy. Ultimately we may need to perform coronary angiography to clear the air, but I do not think he is experiencing an acute coronary syndrome.  [place] will sign off.   Medication Recommendations: Stop valsartan and stop isosorbide mononitrate.  Increase carvedilol to 6.25 mg twice daily.  Continue aspirin 81 mg daily and rosuvastatin 20 mg daily OK to take an extra half tablet of carvedilol (3.125 mg) as needed for symptomatic spikes in blood pressure Other recommendations (labs, testing, etc):  n/a Follow up as an outpatient: Has an appointment this Tuesday with Dr. Ellouise Newer   Sanda Klein, MD, [place] [phone] 02/09/2022, 9:43 AM

## 2022-02-09 NOTE — Discharge Summary (Signed)
Physician Discharge Summary  Stephen Walton HFW:[id number] DOB: Jun 30, 1955 DOA: 02/08/2022  PCP: [facility]  Admit date: 02/08/2022 Discharge date: 02/09/2022  Admitted From: Home Disposition: Home  Recommendations for Outpatient Follow-up:  Follow up with PCP in 1-2 weeks Cardiology follow-up is already scheduled for next week    Discharge Condition: Stable CODE STATUS: Full code Diet recommendation: Low-salt diet  Discharge summary: 66 year old gentleman with history of obstructive sleep apnea on CPAP, obesity, hyperlipidemia and recently diagnosed coronary artery disease with abnormal coronary CT scan present to the ER with left precordial chest pain and elevated blood pressures of 210/111 that improved with 2 nitroglycerin in the emergency room.  Serial troponins negative.  EKG was without any acute changes.  Acute coronary syndrome ruled out.  Patient currently stable.  Plan: Cardiology recommended outpatient follow-up and further work-up.  He already has a scheduled follow-up with Dr. Claiborne Billings next week. Patient is going to stop valsartan and isosorbide mononitrate.  Dose of carvedilol was increased to 6.25 mg twice daily.  Patient is already on aspirin 81 mg daily and Crestor 20 mg daily. Asymptomatic so discharged home with planned outpatient follow-up.   Discharge Diagnoses:  Principal Problem:   Chest pain Active Problems:   Obstructive sleep apnea on CPAP   Atypical chest pain   Obesity   Metabolic syndrome   Hyperlipidemia   Hypogonadism male   BPH (benign prostatic hyperplasia)   Depression   Benign essential HTN   GERD (gastroesophageal reflux disease)   Systolic HF (heart failure) (HCC)   Unstable angina [facility])    Discharge Instructions  Discharge Instructions     Diet - low sodium heart healthy   Complete by: As directed    Increase activity slowly   Complete by: As directed       Allergies as of 02/09/2022   No Known  Allergies      Medication List     STOP taking these medications    isosorbide mononitrate 30 MG 24 hr tablet Commonly known as: IMDUR   valsartan 40 MG tablet Commonly known as: Diovan       TAKE these medications    alfuzosin 10 MG 24 hr tablet Commonly known as: UROXATRAL Take 1 tablet (10 mg total) by mouth in the morning and at bedtime.   aspirin 81 MG tablet Take 81 mg by mouth daily.   buPROPion 300 MG 24 hr tablet Commonly known as: WELLBUTRIN XL Take 300 mg by mouth daily.   busPIRone 15 MG tablet Commonly known as: BUSPAR Take 15 mg by mouth at bedtime.   carvedilol 6.25 MG tablet Commonly known as: COREG Take 1 tablet (6.25 mg total) by mouth 2 (two) times daily. What changed:  medication strength how much to take   fish oil-omega-3 fatty acids 1000 MG capsule Take 1 g by mouth daily.   nitroGLYCERIN 0.4 MG SL tablet Commonly known as: NITROSTAT Place 1 tablet (0.4 mg total) under the tongue every 5 (five) minutes as needed for chest pain.   NON FORMULARY at bedtime. CPAP   rosuvastatin 20 MG tablet Commonly known as: CRESTOR Take 1 tablet (20 mg total) by mouth daily.   testosterone cypionate 200 MG/ML injection Commonly known as: DEPOTESTOSTERONE CYPIONATE Inject 0.5 mLs (100 mg total) into the muscle every 7 (seven) days.        No Known Allergies  Consultations: Cardiology   Procedures/Studies: DG Chest Port 1 View  Result Date: 02/08/2022 CLINICAL DATA:  66 year old male with chest pain onset 0130 hours. EXAM: PORTABLE CHEST 1 VIEW COMPARISON:  Portable chest 10/20/2021 and earlier. FINDINGS: Portable AP upright view at 0409 hours. Lower lung volumes. Mild diffuse crowding of lung markings now. Mediastinal contours are stable and within normal limits. Visualized tracheal air column is within normal limits. No pneumothorax. No convincing pulmonary edema or acute pulmonary opacity. No acute osseous abnormality identified.  Negative visible bowel gas. IMPRESSION: Lower lung volumes, otherwise no acute cardiopulmonary abnormality. Electronically Signed   By: Genevie Ann M.D.   On: 02/08/2022 04:25   (Echo, Carotid, EGD, Colonoscopy, ERCP)    Subjective: Patient seen and examined.  Denies any complaints.  No more chest pain since coming to the emergency room yesterday.    Discharge Exam: Vitals:   02/09/22 0452 02/09/22 0741  BP: 111/72 128/87  Pulse: 68 73  Resp: 16 19  Temp: 98.1 F (36.7 C) 98.3 F (36.8 C)  SpO2: 94% 98%   Vitals:   02/08/22 2000 02/09/22 0019 02/09/22 0452 02/09/22 0741  BP: 125/83 119/77 111/72 128/87  Pulse: 76 68 68 73  Resp: '14 12 16 19  '$ Temp: 97.9 F (36.6 C) 97.8 F (36.6 C) 98.1 F (36.7 C) 98.3 F (36.8 C)  TempSrc: Oral Oral Oral Oral  SpO2: 96% 96% 94% 98%  Weight:      Height:        General: Pt is alert, awake, not in acute distress Cardiovascular: RRR, S1/S2 +, no rubs, no gallops Respiratory: CTA bilaterally, no wheezing, no rhonchi Abdominal: Soft, NT, ND, bowel sounds + Extremities: no edema, no cyanosis    The results of significant diagnostics from this hospitalization (including imaging, microbiology, ancillary and laboratory) are listed below for reference.     Microbiology: Recent Results (from the past 240 hour(s))  MRSA Next Gen by PCR, Nasal     Status: None   Collection Time: 02/08/22  6:46 PM   Specimen: Nasal Mucosa; Nasal Swab  Result Value Ref Range Status   MRSA by PCR Next Gen NOT DETECTED NOT DETECTED Final    Comment: (NOTE) The GeneXpert MRSA Assay (FDA approved for NASAL specimens only), is one component of a comprehensive MRSA colonization surveillance program. It is not intended to diagnose MRSA infection nor to guide or monitor treatment for MRSA infections. Test performance is not FDA approved in patients less than 19 years old. Performed at [facility], [place] [address], [place], [place] [address]      Labs: BNP  (last 3 results) No results for input(s): "BNP" in the last 8760 hours. Basic Metabolic Panel: Recent Labs  Lab 02/08/22 0433 02/09/22 0022  NA 136 140  K 5.1 4.9  CL 104 103  CO2 25 28  GLUCOSE 95 104*  BUN 21 19  CREATININE 1.30* 1.40*  CALCIUM 9.3 9.4   Liver Function Tests: No results for input(s): "AST", "ALT", "ALKPHOS", "BILITOT", "PROT", "ALBUMIN" in the last 168 hours. No results for input(s): "LIPASE", "AMYLASE" in the last 168 hours. No results for input(s): "AMMONIA" in the last 168 hours. CBC: Recent Labs  Lab 02/08/22 0433  WBC 10.7*  HGB 15.7  HCT 49.8  MCV 78.5*  PLT 334   Cardiac Enzymes: No results for input(s): "CKTOTAL", "CKMB", "CKMBINDEX", "TROPONINI" in the last 168 hours. BNP: Invalid input(s): "POCBNP" CBG: No results for input(s): "GLUCAP" in the last 168 hours. D-Dimer No results for input(s): "DDIMER" in the last 72 hours. Hgb A1c No results  for input(s): "HGBA1C" in the last 72 hours. Lipid Profile Recent Labs    02/08/22 0547  CHOL 118  HDL 53  LDLCALC 53  TRIG 61  CHOLHDL 2.2   Thyroid function studies No results for input(s): "TSH", "T4TOTAL", "T3FREE", "THYROIDAB" in the last 72 hours.  Invalid input(s): "FREET3" Anemia work up No results for input(s): "VITAMINB12", "FOLATE", "FERRITIN", "TIBC", "IRON", "RETICCTPCT" in the last 72 hours. Urinalysis    Component Value Date/Time   APPEARANCEUR Clear 12/26/2021 1436   GLUCOSEU Negative 12/26/2021 1436   BILIRUBINUR Negative 12/26/2021 1436   KETONESUR small (15) (A) 10/29/2021 1542   PROTEINUR 1+ (A) 12/26/2021 1436   UROBILINOGEN 1.0 10/29/2021 1542   NITRITE Negative 12/26/2021 1436   LEUKOCYTESUR Negative 12/26/2021 1436   Sepsis Labs Recent Labs  Lab 02/08/22 0433  WBC 10.7*   Microbiology Recent Results (from the past 240 hour(s))  MRSA Next Gen by PCR, Nasal     Status: None   Collection Time: 02/08/22  6:46 PM   Specimen: Nasal Mucosa; Nasal Swab   Result Value Ref Range Status   MRSA by PCR Next Gen NOT DETECTED NOT DETECTED Final    Comment: (NOTE) The GeneXpert MRSA Assay (FDA approved for NASAL specimens only), is one component of a comprehensive MRSA colonization surveillance program. It is not intended to diagnose MRSA infection nor to guide or monitor treatment for MRSA infections. Test performance is not FDA approved in patients less than 18 years old. Performed at [facility], [place] [address] [place], [place] [address]      Time coordinating discharge:  32 minutes  SIGNED:   Barb Merino, MD  [facility] 02/09/2022, 11:05 AM

## 2022-02-11 ENCOUNTER — Ambulatory Visit: Payer: Medicare Other | Admitting: Cardiovascular Disease

## 2022-02-12 ENCOUNTER — Ambulatory Visit: Payer: Medicare Other | Attending: Cardiovascular Disease | Admitting: Cardiovascular Disease

## 2022-02-12 ENCOUNTER — Encounter: Payer: Self-pay | Admitting: Cardiovascular Disease

## 2022-02-12 DIAGNOSIS — I25118 Atherosclerotic heart disease of native coronary artery with other forms of angina pectoris: Secondary | ICD-10-CM | POA: Diagnosis not present

## 2022-02-12 DIAGNOSIS — I428 Other cardiomyopathies: Secondary | ICD-10-CM

## 2022-02-12 DIAGNOSIS — G4733 Obstructive sleep apnea (adult) (pediatric): Secondary | ICD-10-CM | POA: Diagnosis not present

## 2022-02-12 DIAGNOSIS — F419 Anxiety disorder, unspecified: Secondary | ICD-10-CM | POA: Diagnosis present

## 2022-02-12 DIAGNOSIS — E785 Hyperlipidemia, unspecified: Secondary | ICD-10-CM

## 2022-02-12 DIAGNOSIS — I200 Unstable angina: Secondary | ICD-10-CM

## 2022-02-12 DIAGNOSIS — R072 Precordial pain: Secondary | ICD-10-CM | POA: Diagnosis present

## 2022-02-12 MED ORDER — AMLODIPINE BESYLATE 5 MG PO TABS: 5.0000 mg | ORAL_TABLET | Freq: Every day | ORAL | 3 refills | Status: DC

## 2022-02-12 MED ORDER — CARVEDILOL 6.25 MG PO TABS: 6.2500 mg | ORAL_TABLET | Freq: Two times a day (BID) | ORAL | 1 refills | Status: DC

## 2022-02-12 NOTE — Patient Instructions (Addendum)
Medication Instructions:  START Carvedilol 6.25 mg twice daily  START Amlodipine 5 mg daily   *If you need a refill on your cardiac medications before your next appointment, please call your pharmacy*   Follow-Up: At [facility], you and your health needs are our priority.  As part of our continuing mission to provide you with exceptional heart care, we have created designated Provider Care Teams.  These Care Teams include your primary Cardiologist (physician) and Advanced Practice Providers (APPs -  Physician Assistants and Nurse Practitioners) who all work together to provide you with the care you need, when you need it.  We recommend signing up for the patient portal called "MyChart".  Sign up information is provided on this After Visit Summary.  MyChart is used to connect with patients for Virtual Visits (Telemedicine).  Patients are able to view lab/test results, encounter notes, upcoming appointments, etc.  Non-urgent messages can be sent to your provider as well.   To learn more about what you can do with MyChart, go to [email or web address].    Your next appointment:   January 2nd, 2024 @ 2:45 PM  The format for your next appointment:   In Person  Provider:   Almyra Deforest, PA-C    Then, Shelva Majestic, MD will plan to see you again April 30th at 9:20 AM

## 2022-02-12 NOTE — Progress Notes (Signed)
Patient ID: Stephen Walton, male   DOB: 1955-07-12, 66 y.o.   MRN: [id number]        HPI: Stephen Walton is a 66 y.o. male who presents for a cardiology evaluation and follow-up of his overnight hospitalization.  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 He has a history of obesity, sleep apnea on CPAP therapy, and in 2013 developed episodes of chest pain with some atypical features. This occurred shortly after his brother suffered a cardiac arrest. An echo Doppler study revealed mild concentric LVH with normal systolic and diastolic function. He did have mild aortic valve sclerosis without stenosis. A nuclear perfusion study done on 10/04/2011 showed normal perfusion.  He has the metabolic syndrome. In March 2014 an NMR lipoprofile showed an increase LDL particle number at 1647 with an LDL of 132. He had 736 small LDL particles. His insulin resistance was 53. HDL was 53. Normal HDL particle #37.2. He has been on simvastatin 20 mg since that time. Subsequent blood work showed marked improvement with a total cholesterol 146 triglycerides 114 HDL 49 LDL cholesterol 74. Stephen Walton walks at least 5 miles daily. However, despite this has been difficult for him  to lose weight. He denies recurrent chest pain.   He admits to 100% CPAP use.  He is the father of Stephen Walton, who previously had worked for [employer] in [place].  Over the past 2 years, he has gained approximately 10 additional pounds of weight despite his exercise. He has had recent laboratory done by Stephen Walton in [place]. He denies recurrent episodes of chest pain. He denies palpitations. He denies presyncope or syncope.  When I saw him several years ago his brother recently died with a myocardial infarction.  Stephen Walton had gained some weight but had lost 20 poundsafter he  was given phentermine, which assisted him in his weight  loss by a former physician at [facility] and he had seen a nutritionist.  After the physician left the new physician who had taken her place would not reinstitute this therapy.  He continues to walk approximately 5 miles per day at work at [employer].  He had gained significant weight.  When he was placed on Lexapro for depression but due to this.  He stopped taking the Lexapro and lost 20 pounds as noted above.  I saw him, since his CPAP machine was very old, and was not downloadable, I had recommended he undergo a follow-up sleep study.  This was done in October 2016.  During that study , he was felt to have increased upper airway resistance, but did not meet criteria for obstructive sleep apnea.  Last year, he apparently underwent a home study, which he brought with him and I reviewed today.  This showed an AHI of 14.3 with an RDI of 20.5 in June 2017.  Since then he has been on a CPAP auto unit which was supplied by [employer].  He had blood work at [facility] by Stephen Walton a Stephen Walton which I reviewed, which was obtained last April 2017.  Hemoglobin 16.9, hematocrit 50.5.  BUN 15, creatinine 1.02.  Glucose 90.  Total cholesterol 146, triglycerides 75, HDL 50, LDL 76.  TSH was 1.18.  I saw him in February 2015 he complained of weight gain.  He is now 20 pounds above what he had weighed 2016.  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  His shift is from 6 PM to 6 AM.  He typically walks 5 miles at [facility].  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  He admits to some occasional left ankle swelling.  He was started on 6 Zander for appetite control by Stephen Walton at [facility].  This has resulted in weight loss of approximately 16 pounds.  I saw him in May 2019 at which time he was not sleeping adequate duration due to his night shift.  He continues to use CPAP.  He brought a download from his app to the  office today.  He is averaging 5 hours and 57 minutes of sleep per night.  AHI is excellent at 1.4.  At times he does have a mask leak.  He recently underwent laboratory at [facility] on June 04, 2017.  Hemoglobin was 14.2 hematocrit 44.8.  MCV low at 75.  Renal function was normal with a creatinine 1.09.  Total cholesterol 131, triglycerides 76, HDL 47, LDL 69.  Hemoglobin A1c was 5.8.  He is followed every 6 months by Stephen Walton.  PSA was 6.7.  He also is on testosterone injections.  He tells me he has been undergoing blood donation every 2 months.    I evaluated him on September 16, 2018 in a telemedicine visit. At that time [place].  [name] had died.  He wasusing CPAP therapy.  His supply company is [employer] in [place].  He has a ResMed CPAP machine and has been using a Quatro full facemask.  He admits compliance.  His daughter Stephen Walton had interrogated his unit and his AHI is 2.48.  Over the last 30 days his average sleep duration was 6 hours and 7 minutes.  He underwent laboratory at work by Stephen Walton as well as an EKG which apparently showed normal sinus rhythm with a QT interval at 376.  BUN 21 creatinine 1.01.  Most recent lipid studies have shown total cholesterol 174, triglycerides 90, LDL 105, HDL 51 on his current dose of simvastatin.  He denies any chest pain or shortness of breath.  His peak weight was 260 and he has reduced his weight to 237 and continues to be on Saxenda.  He sees Stephen Walton for his BPH.  At times he does admit to some mild lower extremity fluid retention at the end of the day.   I last saw him in the office on March 30, 2019.  Over the prior 6 months he was without chest pain.  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 He plans to retire at the end of June.  He had just purchased a new [place].  He had recent laboratory at [facility] on March 22, 2019 which showed a hemoglobin  15.8, hematocrit 50.6, MCV 76.  Creatinine 1.02.  His anxiety is better on medications.  He continues to get supplies from [employer].  I obtained a new download of his CPAP unit.  He is meeting compliance standards with average usage at 6 hours and 18 minutes.  His auto unit was set at a minimum pressure of 5 and maximum of 20, 95th percentile pressure was 11.7 with a maximum average pressure of 13.2.  AHI was 2.1.  He had recently purchased a new ResMed travel CPAP unit  which he plans to use on trips after his retirement.  He has a new Quatro full facemask.    Since I last saw him, he has been evaluated by Stephen Deforest, PA on several occasions.  He had experienced some chest pain in June 2023.  A coronary CT showed a calcium score of 70.6 placing him at the 47th percentile.  An echo cardiogram on September 04, 2021 reportedly showed an EF of 40 to 45%.  He was started on carvedilol and Entresto.  He was hospitalized with chest pain in August 2023 and was seen by Dr. Margaretann Walton and by her review his EF was 50 to 55% on echo and he had 50 to 69% stenosis in the circumflex with nonhemodynamically significant CT FFR assessment.  Subsequently, he had been reevaluated by Stephen Walton.  The day after Thanksgiving, he presented to the hospital after experiencing nocturnal chest pain with marked blood pressure elevation at 210/111.  Chest pain responded to nitroglycerin.  He was hospitalized overnight and seen in consultation with Stephen Walton/Stephen Walton.  Apparently he has been off his isosorbide for several months.  His blood pressure normalized quickly in the hospital.  Troponins were negative.  As result, it was recommended that he increase carvedilol from 3.125 mg twice a day to 6.25 mg twice a day, discontinue low-dose valsartan and since he was not taking isosorbide continue to be off of this.  Since hospital discharge on February 09, 2022 he feels well.  He has not filled the prescription for the increased dose of  carvedilol and did not realize he can double up his 3.125 mg pill and take that twice a day.  As result he presents to the office today on carvedilol 3.125 twice daily.  Of note, he tells me the week before he went to the hospital he had undergone a steroid injection in his left hip.  He continues to be on aspirin.  He is on rosuvastatin 20 mg in addition to omega-3 fatty acid.  He is retired.  He continues to use CPAP for excellent compliance.  A download was obtained from October 29 through February 11, 2022 which shows excellent compliance and benefit with average use at 8 hours and 16 minutes AHI 1.0 with his pressure range set between 10 and 14 cm of water.  He presents for cardiology evaluation.     Past Medical History:  Diagnosis Date   Chest pain, atypical    H/O echocardiogram 10/04/2011   EF >55%;mild concentric LVH; normals systolic & diastolic dysfunction; mild aortic stenosis   History of nuclear stress test 10/04/2011   low risk; small amount of basal inferior bowel artifact   Hyperlipidemia    OSA on CPAP     Past Surgical History:  Procedure Laterality Date   COLONOSCOPY N/A 11/14/2020   Procedure: COLONOSCOPY;  Surgeon: Aviva Signs, MD;  Location: [facility];  Service: Gastroenterology;  Laterality: N/A;    No Known Allergies   Current Outpatient Medications  Medication Sig Dispense Refill   alfuzosin (UROXATRAL) 10 MG 24 hr tablet Take 1 tablet (10 mg total) by mouth in the morning and at bedtime. 60 tablet 11   amLODipine (NORVASC) 5 MG tablet Take 1 tablet (5 mg total) by mouth daily. 90 tablet 3   aspirin 81 MG tablet Take 81 mg by mouth daily.     buPROPion (WELLBUTRIN XL) 300 MG 24 hr tablet Take 300 mg by mouth daily.     busPIRone (BUSPAR)  15 MG tablet Take 15 mg by mouth at bedtime.     fish oil-omega-3 fatty acids 1000 MG capsule Take 1 g by mouth daily.     nitroGLYCERIN (NITROSTAT) 0.4 MG SL tablet Place 1 tablet (0.4 mg total) under the tongue every 5  (five) minutes as needed for chest pain. 25 tablet 2   NON FORMULARY at bedtime. CPAP     rosuvastatin (CRESTOR) 20 MG tablet Take 1 tablet (20 mg total) by mouth daily. 90 tablet 3   testosterone cypionate (DEPOTESTOSTERONE CYPIONATE) 200 MG/ML injection Inject 0.5 mLs (100 mg total) into the muscle every 7 (seven) days. 5 mL 3   carvedilol (COREG) 6.25 MG tablet Take 1 tablet (6.25 mg total) by mouth 2 (two) times daily. 180 tablet 1   No current facility-administered medications for this visit.    Socially he is married has 4 children. 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 There is no tobacco use having quit over 30 years ago. There is nosignificant alcohol use  ROS General: Negative; No fevers, chills, or night sweats;  HEENT: Negative; No changes in vision or hearing, sinus congestion, difficulty swallowing Pulmonary: Negative; No cough, wheezing, shortness of breath, hemoptysis Cardiovascular: Negative; No chest pain, presyncope, syncope, palpitations GI: Negative; No nausea, vomiting, diarrhea, or abdominal pain GU: Enlarged prostate, status Walton biopsy; No dysuria, hematuria, or difficulty voiding Musculoskeletal: Negative; no myalgias, joint pain, or weakness Hematologic/Oncology: Negative; no easy bruising, bleeding Endocrine: Positive for metabolic syndrome; no heat/cold intolerance; no diabetes Neuro: Negative; no changes in balance, headaches Skin: Negative; No rashes or skin lesions Psychiatric: Depression following his brother's death Sleep: Positive for obstructive sleep apnea on CPAP with 100% compliance; recent breakthrough snoring, no daytime sleepiness, hypersomnolence, bruxism, restless legs, hypnogognic hallucinations, no cataplexy Other comprehensive 14 point system review is negative   Physical Exam BP (!) 163/96   Pulse 72   Ht _0  (1.753 m)   Wt 248 lb 12.8 oz (112.9 kg)   SpO2 96%   BMI 36.74 kg/m    Repeat  blood pressure by me was 160/86  Wt Readings from Last 3 Encounters:  02/12/22 248 lb 12.8 oz (112.9 kg)  02/08/22 246 lb 14.6 oz (112 kg)  11/12/21 249 lb 3.2 oz (113 kg)   General: Alert, oriented, no distress.  Mesomorphic Skin: normal turgor, no rashes, warm and dry; multiple tattoos of his previous canine [place] HEENT: Normocephalic, atraumatic. Pupils equal round and reactive to light; sclera anicteric; extraocular muscles intact; Nose without nasal septal hypertrophy Mouth/Parynx benign; Mallinpatti scale 3 Neck: Thick neck no JVD, no carotid bruits; normal carotid upstroke Lungs: clear to ausculatation and percussion; no wheezing or rales Chest wall: without tenderness to palpitation Heart: PMI not displaced, RRR, s1 s2 normal, 1/6 systolic murmur, no diastolic murmur, no rubs, gallops, thrills, or heaves Abdomen: soft, nontender; no hepatosplenomehaly, BS+; abdominal aorta nontender and not dilated by palpation. Back: no CVA tenderness Pulses 2+ Musculoskeletal: full range of motion, normal strength, no joint deformities Extremities: no clubbing cyanosis or edema, Homan's sign negative  Neurologic: grossly nonfocal; Cranial nerves grossly wnl Psychologic: Normal mood and affect   February 12, 2022 ECG (independently read by me): Normal sinus rhythm at 72 bpm.  Rightward axis.  Incomplete right bundle branch block.  Normal intervals.  No ectopy  January limits 2, 2021 ECG (independently read by me): Normal sinus rhythm at 79 bpm.  Normal intervals.  No  ectopy.  No ST segment changes  May 2019 ECG (independently read by me): Normal sinus rhythm at 69 bpm.  No ectopy.  Normal intervals.  February 2018 ECG (independently read by me): Sinus rhythm at 85 bpm.  Rightward axis.  No signal ST changes.  September 2016 ECG (independently read by me): NSR at 76; normal intervals  ECG (independently read by me): Normal sinus rhythm at 77 beats per minute. No ectopy. Normal  intervals.  Prior ECG of 09/28/2012: Sinus rhythm at 61 beats per minute; normal intervals  LABS:  I reviewed recent blood work by Dr. Julianne Walton, M.D., from 07/14/2014.   Hemoglobin 17.3, hematocrit 51.6.  Fasting glucose 92.  BUN 17, currently 0.9.  Normal LFTs.  Total cluster 153, triglycerides 90, HDL 52, LDL 83.  PSA 2.1.  TSH 1.12.  I also reviewed the blood work from April 2017, which is noted above.  Laboratory from [facility] from June 04, 2017 was reviewed as dictated above in HPI  Laboratory from March 22, 2019 was reviewed  Recent hospitalizations in August and November 2023 and evaluations by Stephen Deforest, PA were reviewed  BMET     Latest Ref Rng & Units 02/09/2022   12:22 AM 02/08/2022    4:33 AM 12/21/2021    9:31 AM  BMP  Glucose 70 - 99 mg/dL 104  95  85   BUN 8 - 23 mg/dL _0 Creatinine 0.61 - 1.24 mg/dL 1.40  1.30  1.15   BUN/Creat Ratio 10 - 24   17   Sodium 135 - 145 mmol/L 140  136  139   Potassium 3.5 - 5.1 mmol/L 4.9  5.1  5.5   Chloride 98 - 111 mmol/L 103  104  102   CO2 22 - 32 mmol/L _1 Calcium 8.9 - 10.3 mg/dL 9.4  9.3  9.7        Latest Ref Rng & Units 12/21/2021    9:31 AM 10/20/2021    7:07 AM 04/19/2020    9:48 AM  Hepatic Function  Total Protein 6.0 - 8.5 g/dL 6.8  7.3  6.6   Albumin 3.9 - 4.9 g/dL 4.4  4.0  4.2   AST 0 - 40 IU/L _2 ALT 0 - 44 IU/L _3 Alk Phosphatase 44 - 121 IU/L 86  68  82   Total Bilirubin 0.0 - 1.2 mg/dL 0.6  0.5  0.5     CBC     Latest Ref Rng & Units 02/08/2022    4:33 AM 12/21/2021    9:31 AM 10/20/2021    7:07 AM  CBC  WBC 4.0 - 10.5 K/uL 10.7  8.6  6.2   Hemoglobin 13.0 - 17.0 g/dL 15.7  14.7  14.1   Hematocrit 39.0 - 52.0 % 49.8  47.2  46.7   Platelets 150 - 400 K/uL 334   314     BNP No results found for: "PROBNP"  Lipid Panel     Component Value Date/Time   CHOL 118 02/08/2022 0547   CHOL 110 05/05/2020 1209   TRIG 61 02/08/2022 0547   HDL 53  02/08/2022 0547   HDL 43 05/05/2020 1209   CHOLHDL 2.2 02/08/2022 0547   VLDL 12 02/08/2022 0547   LDLCALC 53 02/08/2022 0547   LDLCALC 51 05/05/2020 1209     RADIOLOGY: No  results found.  IMPRESSION:  1. Coronary artery disease involving native coronary artery of native heart with other form of angina pectoris ([place])   2. Hyperlipidemia LDL goal <70   3. OSA on CPAP   4. NICM (nonischemic cardiomyopathy) ([place])   5. Anxiety   6. Precordial pain    ASSESSMENT AND PLAN: Stephen. 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  He is had difficulty with weight gain over the past several years.  He has seen a nutritionist and has been trying to carefully watch what he eats.  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  However, since his [place] past he does not have a dog with him.  He retired in June 2021.   He has a history of obstructive sleep apnea, anxiety, obesity, and hyperlipidemia.  He has continued to use CPAP with excellent compliance and a download from October 29 through February 11, 2022 shows average use at 8 hours and 16 minutes per night.  AHI is excellent at 1.0 with his 95th percentile pressure 11.4 maximum average pressure 12.2.  He has had issues with blood pressure and had experienced episodic chest discomfort.  He underwent coronary CTA in August 2023 which showed 58 to 69% stenosis in the circumflex coronary artery with negative FFR.  Echo Doppler study was reinterpreted and showed EF in the 50 to 55% range.  He recently had been on low-dose valsartan in addition to carvedilol 3.125 mg twice a day.  He presented to [facility] after awakening from sleep with chest pressure and marked blood pressure elevation.  Cardiac enzymes were negative.  During his overnight hospitalization since he had not been taking isosorbide for several months and his blood pressure had  stabilized it was recommended further titration of beta-blocker with carvedilol to 6.25 mg twice a day was warranted and that at that time he should not take the valsartan.  Since being discharged from the hospital on November 25, he has only taking the 3.125 twice daily dose of carvedilol.  His blood pressure today is elevated.  I have recommended the addition of amlodipine 5 mg which will serve both blood pressure improvement as well as potential anti-ischemic benefit and have recommended he double up his 3.125 twice daily carvedilol dose and have given him a prescription for the 6.25 mg regimen.  He will monitor his blood pressure.  I reviewed laboratory from November 24 which showed excellent LDL cholesterol at 53.  Creatinine was 1.4.  Potassium 4.9.  He continues to be on rosuvastatin 20 mg in addition to omega-3 fatty acid 1 g daily.  He continues to be on aspirin.  He continues to take Wellbutrin and buspirone for for his secondary number of these dictated that he can always dictated from today history of anxiety I have recommended he see how Stephen Post, PA in 3 to 4 weeks for further assessment and he may need medication titration depending upon blood pressure result.  I will see him in 3 months for cardiology reevaluation or sooner as needed.    Troy Sine, MD, [facility]  02/12/2022 11:22 AM

## 2022-02-13 NOTE — Addendum Note (Signed)
Addended by: Hinton Dyer on: 02/13/2022 04:34 PM   Modules accepted: Orders

## 2022-03-01 ENCOUNTER — Telehealth: Payer: Self-pay | Admitting: Cardiovascular Disease

## 2022-03-01 MED ORDER — AMLODIPINE BESYLATE 2.5 MG PO TABS: 2.5000 mg | ORAL_TABLET | Freq: Every day | ORAL | 3 refills | Status: DC

## 2022-03-01 NOTE — Telephone Encounter (Signed)
Pt c/o medication issue:  1. Name of Medication: amLODipine (NORVASC) 5 MG tablet   2. How are you currently taking this medication (dosage and times per day)? Take 1 tablet (5 mg total) by mouth daily. - Oral   3. Are you having a reaction (difficulty breathing--STAT)?   4. What is your medication issue? Pt states for the past two night he has woken up out of his sleep and it feels like his heart is pounding out of his chest. He stated that it lasted for about an hour continuously. Pt recorded his bp/hr below.   Patient c/o Palpitations:  High priority if patient c/o lightheadedness, shortness of breath, or chest pain  How long have you had palpitations/irregular HR/ Afib? Are you having the symptoms now? Two nights, no symptoms at the moment   Are you currently experiencing lightheadedness, SOB or CP? No   Do you have a history of afib (atrial fibrillation) or irregular heart rhythm? No   Have you checked your BP or HR? (document readings if available):   145/85 hr 83 - last night  5) Are you experiencing any other symptoms? Upset stomach  ?

## 2022-03-01 NOTE — Telephone Encounter (Signed)
Patient stated that for the past 2 nights, he was awakened with intense palpitations. They were not associated with SOB.  BP 130s/ 80s, sat 96%. He believes the palpitations are from taking amlodipine. He takes it at night because it makes him dizzy. Spoke with Dr. Gwenlyn Found (DOD) who ordered for patient to take amlodipine 2.'5mg'$  each night. Patient advised and verbalized understanding. He will continue to monitor BP. Gave him cardiology on-call number [phone] in case he needs help over the weekend.

## 2022-03-19 ENCOUNTER — Encounter: Payer: Self-pay | Admitting: Physician Assistant

## 2022-03-19 ENCOUNTER — Ambulatory Visit: Payer: Medicare Other | Attending: Physician Assistant | Admitting: Physician Assistant

## 2022-03-19 VITALS — BP 135/91 | HR 75 | Ht 69.0 in | Wt 250.2 lb

## 2022-03-19 DIAGNOSIS — I2510 Atherosclerotic heart disease of native coronary artery without angina pectoris: Secondary | ICD-10-CM | POA: Diagnosis present

## 2022-03-19 DIAGNOSIS — R0989 Other specified symptoms and signs involving the circulatory and respiratory systems: Secondary | ICD-10-CM | POA: Diagnosis present

## 2022-03-19 DIAGNOSIS — E785 Hyperlipidemia, unspecified: Secondary | ICD-10-CM | POA: Diagnosis present

## 2022-03-19 NOTE — Patient Instructions (Signed)
Medication Instructions:   Your physician recommends that you continue on your current medications as directed. Please refer to the Current Medication list given to you today.  *If you need a refill on your cardiac medications before your next appointment, please call your pharmacy*  Lab Work: NONE ordered at this time of appointment   If you have labs (blood work) drawn today and your tests are completely normal, you will receive your results only by: [place] (if you have MyChart) OR A paper copy in the mail If you have any lab test that is abnormal or we need to change your treatment, we will call you to review the results.  Testing/Procedures: NONE ordered at this time of appointment   Follow-Up: At [facility], you and your health needs are our priority.  As part of our continuing mission to provide you with exceptional heart care, we have created designated Provider Care Teams.  These Care Teams include your primary Cardiologist (physician) and Advanced Practice Providers (APPs -  Physician Assistants and Nurse Practitioners) who all work together to provide you with the care you need, when you need it.  Your next appointment:   As previous    The format for your next appointment:   In Person  Provider:   Shelva Majestic, MD     Other Instructions  Important Information About Sugar

## 2022-03-19 NOTE — Progress Notes (Signed)
Cardiology Office Note:    Date:  03/19/2022   ID:  Stephen Walton, DOB 08-30-1955, MRN [id number]  PCP:  [facility] [place] [facility] Cardiologist:  Shelva Majestic, MD     Referring MD: [facility]   Chief Complaint  Patient presents with   Follow-up    Seen for Dr. Claiborne Billings    History of Present Illness:    Stephen Walton is a 67 y.o. male with a hx of OSA on CPAP, obesity, hyperlipidemia, and history of chest pain with negative work-up.  Patient previously had chest pain shortly after his brother had cardiac arrest.  Echocardiogram obtained at the time showed moderate LVH with normal systolic and diastolic function.  Myoview obtained in July 2013 showed normal perfusion.  I saw the patient on 08/23/2021 for precordial chest pain.  He had prolonged chest pain 4 days prior to the office visit that lasted entire day.  We obtained a coronary CT that showed mild CAD.  Echocardiogram however showed EF was borderline low.  I discussed with the patient regarding medical therapy versus definitive cardiac catheterization.  We eventually decided on a trial of medical therapy first.  I started him on carvedilol and Entresto.  Due to inability to get medication assistance, he was eventually switched to valsartan.  Patient was admitted to the hospital on 10/20/2021 and was seen by Dr. Margaretann Loveless who has independently reviewed the previous coronary CT and echocardiogram.  It was felt that her EF was low normal at 50 to 55%.  It was also felt the patient likely has a at least moderate disease in left circumflex artery, however FFR was negative.  Dr. Margaretann Loveless suspected his chest pain was more related to blood pressure spikes.  Imdur was added during this hospitalization.  However if the patient continues to have chest pain, it was recommended to consider coronary angiography with PCI.  More recently, patient was readmitted to the hospital in November 2023 with  nocturnal chest pain with markedly elevated blood pressure of 210/111.  Chest pain responded to nitroglycerin.  He was off of Imdur for several months.  Blood pressure quickly normalized in the hospital.  Troponin was negative.  Carvedilol was increased to 6.25 mg twice a day.  Valsartan was discontinued.  Previous Imdur was not restarted due to headache and dizziness.  Since discharge, patient was seen by Dr. Claiborne Billings on 02/12/2022 we will add amlodipine 5 mg daily to his medical regimen.  He called our office on 12/15 to report palpitation, he was seen to be instructed to reduce amlodipine to 2.5 mg every night.  Patient presents today for follow-up.  Blood pressure is fairly controlled.  He has occasional chest twinges that last only a second or 2, however none persistent.  Symptom does not occur with physical activity.  He has no lower extremity edema, orthopnea or PND.  He is doing well and can follow-up with Dr. Claiborne Billings in April.  Past Medical History:  Diagnosis Date   Chest pain, atypical    H/O echocardiogram 10/04/2011   EF >55%;mild concentric LVH; normals systolic & diastolic dysfunction; mild aortic stenosis   History of nuclear stress test 10/04/2011   low risk; small amount of basal inferior bowel artifact   Hyperlipidemia    OSA on CPAP     Past Surgical History:  Procedure Laterality Date   COLONOSCOPY N/A 11/14/2020   Procedure: COLONOSCOPY;  Surgeon: Aviva Signs, MD;  Location:  [facility];  Service: Gastroenterology;  Laterality: N/A;    Current Medications: Current Meds  Medication Sig   alfuzosin (UROXATRAL) 10 MG 24 hr tablet Take 1 tablet (10 mg total) by mouth in the morning and at bedtime.   amLODipine (NORVASC) 2.5 MG tablet Take 1 tablet (2.5 mg total) by mouth daily.   aspirin 81 MG tablet Take 81 mg by mouth daily.   buPROPion (WELLBUTRIN XL) 300 MG 24 hr tablet Take 300 mg by mouth daily.   busPIRone (BUSPAR) 15 MG tablet Take 15 mg by mouth at bedtime.    carvedilol (COREG) 6.25 MG tablet Take 1 tablet (6.25 mg total) by mouth 2 (two) times daily.   fish oil-omega-3 fatty acids 1000 MG capsule Take 1 g by mouth daily.   nitroGLYCERIN (NITROSTAT) 0.4 MG SL tablet Place 1 tablet (0.4 mg total) under the tongue every 5 (five) minutes as needed for chest pain.   NON FORMULARY at bedtime. CPAP   rosuvastatin (CRESTOR) 20 MG tablet Take 1 tablet (20 mg total) by mouth daily.   testosterone cypionate (DEPOTESTOSTERONE CYPIONATE) 200 MG/ML injection Inject 0.5 mLs (100 mg total) into the muscle every 7 (seven) days.     Allergies:   Patient has no known allergies.   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  Vaping Use   Vaping Use: Never used  Substance and Sexual Activity   Alcohol use: Yes    Comment: 1-2 beers per month   Drug use: Never   Sexual activity: Not on file  Other Topics Concern   Not on file  Social History Narrative   Not on file   Social Determinants of Health   Financial Resource Strain: Not on file  Food Insecurity: Not on file  Transportation Needs: Not on file  Physical Activity: Not on file  Stress: Not on file  Social Connections: Not on file     Family History: The patient's family history includes Heart attack in his brother.  ROS:   Please see the history of present illness.     All other systems reviewed and are negative.  EKGs/Labs/Other Studies Reviewed:    The following studies were reviewed today:  Echo 09/04/2021 1. Left ventricular ejection fraction, by estimation, is 40 to 45%. The  left ventricle has mildly decreased function. The left ventricle has no   regional wall motion abnormalities. There is mild left ventricular  hypertrophy. Left ventricular diastolic  parameters were normal. The average left ventricular global longitudinal  strain is -13.7 %. The global longitudinal strain is abnormal.   2. Right ventricular systolic function is normal. The right ventricular  size is normal.   3. The mitral valve is normal in structure. Trivial mitral valve  regurgitation.   4. The aortic valve is normal in structure. Aortic valve regurgitation is  not visualized. No aortic stenosis is present.   5. The inferior vena cava is normal in size with greater than 50%  respiratory variability, suggesting right atrial pressure of 3 mmHg.    Coronary CT 08/27/2021 FINDINGS:  Image quality: Excellent.   Noise artifact is: Limited.   Coronary Arteries:  Normal coronary origin.  Right dominance.   Left main: The left main is a large caliber vessel with a normal take off from the left coronary cusp that trifurcates into a LAD, LCX, and ramus intermedius. There is no plaque or stenosis.   Left anterior descending artery: The LAD gives off 2 patent diagonal branches. There is mild calcified plaque in the proximal to mid LAD with associated stenosis of 25-49%.   Ramus intermedius: Very small and patent with no evidence of plaque or stenosis.   Left circumflex artery: The LCX is non-dominant and gives off 2 patent obtuse marginal branches. There is mild calcified plaque in the mid LCx with associated stenosis of 25-49%.   Right coronary artery: The RCA is dominant with normal take off from the right coronary cusp. There is no evidence of plaque or stenosis. The RCA terminates as a PDA and right posterolateral branch without evidence of plaque or stenosis.   Right Atrium: Right atrial size is within normal limits.   Right Ventricle: The right ventricular cavity is within normal limits.   Left Atrium: Left atrial size is normal in size with no  left atrial appendage filling defect.   Left Ventricle: The ventricular cavity size is within normal limits. There are no stigmata of prior infarction. There is no abnormal filling defect.   Pulmonary arteries: Normal in size without proximal filling defect.   Pulmonary veins: Normal pulmonary venous drainage.   Pericardium: Normal thickness with no significant effusion or calcium present.   Cardiac valves: The aortic valve is trileaflet without significant calcification. The mitral valve is normal structure without significant calcification.   Aorta: Normal caliber with no significant disease.   Extra-cardiac findings: See attached radiology report for non-cardiac structures.   IMPRESSION: 1. Coronary calcium score of 70.6. This was 47th percentile for age-, sex, and race-matched controls.   2.  Normal coronary origin with right dominance.   3.  Mild atherosclerosis of the LAD and LCx.  CAD RADS 2.   4.  Recommend preventive therapy and risk factor modification.     EKG:  EKG is not ordered today.    Recent Labs: 12/21/2021: ALT 20 02/08/2022: Hemoglobin 15.7; Platelets 334 02/09/2022: BUN 19; Creatinine, Ser 1.40; Potassium 4.9; Sodium 140  Recent Lipid Panel    Component Value Date/Time   CHOL 118 02/08/2022 0547   CHOL 110 05/05/2020 1209   TRIG 61 02/08/2022 0547   HDL 53 02/08/2022 0547   HDL 43 05/05/2020 1209   CHOLHDL 2.2 02/08/2022 0547   VLDL 12 02/08/2022 0547   LDLCALC 53 02/08/2022 0547   LDLCALC 51 05/05/2020 1209     Risk Assessment/Calculations:           Physical Exam:    VS:  BP (!) 135/91   Pulse 75   Ht '5\' 9"'$  (1.753 m)   Wt 250 lb 3.2 oz (113.5 kg)   SpO2 95%   BMI 36.95 kg/m        Wt Readings from Last 3 Encounters:  03/19/22 250 lb 3.2 oz (113.5 kg)  02/12/22 248 lb 12.8 oz (112.9 kg)  02/08/22 246 lb 14.6 oz (112 kg)     GEN:  Well nourished, well developed in no acute distress HEENT: Normal NECK: No JVD; No  carotid bruits LYMPHATICS: No lymphadenopathy CARDIAC: RRR, no murmurs, rubs, gallops RESPIRATORY:  Clear to auscultation without rales, wheezing or  rhonchi  ABDOMEN: Soft, non-tender, non-distended MUSCULOSKELETAL:  No edema; No deformity  SKIN: Warm and dry NEUROLOGIC:  Alert and oriented x 3 PSYCHIATRIC:  Normal affect   ASSESSMENT:    1. Coronary artery disease involving native coronary artery of native heart without angina pectoris   2. Labile hypertension   3. Hyperlipidemia LDL goal <70    PLAN:    In order of problems listed above:  CAD: Patient has moderate disease in the left circumflex territory.  He previously had chest pain with blood pressure spikes.  His chest pain has been well-controlled.  He will notice occasional chest twinges that only last 1 to 2 seconds each, this is unlikely to be cardiac.  He does not have any exertional chest discomfort  Labile hypertension: Blood pressure seems to be very well-controlled after addition of amlodipine.  He could not tolerate the 5 mg amlodipine as he had increased palpitation, amlodipine was later reduced to 2.5 mg daily which he tolerated well  Hyperlipidemia: On Lipitor.           Medication Adjustments/Labs and Tests Ordered: Current medicines are reviewed at length with the patient today.  Concerns regarding medicines are outlined above.  No orders of the defined types were placed in this encounter.  No orders of the defined types were placed in this encounter.   Patient Instructions  Medication Instructions:   Your physician recommends that you continue on your current medications as directed. Please refer to the Current Medication list given to you today.  *If you need a refill on your cardiac medications before your next appointment, please call your pharmacy*  Lab Work: NONE ordered at this time of appointment   If you have labs (blood work) drawn today and your tests are completely normal, you will  receive your results only by: [place] (if you have MyChart) OR A paper copy in the mail If you have any lab test that is abnormal or we need to change your treatment, we will call you to review the results.  Testing/Procedures: NONE ordered at this time of appointment   Follow-Up: At [facility], you and your health needs are our priority.  As part of our continuing mission to provide you with exceptional heart care, we have created designated Provider Care Teams.  These Care Teams include your primary Cardiologist (physician) and Advanced Practice Providers (APPs -  Physician Assistants and Nurse Practitioners) who all work together to provide you with the care you need, when you need it.  Your next appointment:   As previous    The format for your next appointment:   In Person  Provider:   Shelva Majestic, MD     Other Instructions  Important Information About Sugar         Hilbert Corrigan, [place]  03/19/2022 9:16 PM    [place]

## 2022-04-02 NOTE — Telephone Encounter (Signed)
Seen in office on 01/02 with PA.  Thank you!

## 2022-04-15 ENCOUNTER — Telehealth: Payer: Self-pay | Admitting: Cardiovascular Disease

## 2022-04-15 ENCOUNTER — Other Ambulatory Visit: Payer: Self-pay | Admitting: Physician Assistant

## 2022-04-15 NOTE — Telephone Encounter (Signed)
*  STAT* If patient is at the pharmacy, call can be transferred to refill team.   1. Which medications need to be refilled? (please list name of each medication and dose if known)  carvedilol (COREG) 3.25 MG tablet  2. Which pharmacy/location (including street and city if local pharmacy) is medication to be sent to? [facility] [address] [place]   3. Do they need a 30 day or 90 day supply?  90 day supply

## 2022-04-16 ENCOUNTER — Other Ambulatory Visit: Payer: Self-pay | Admitting: Physician Assistant

## 2022-04-16 MED ORDER — CARVEDILOL 6.25 MG PO TABS: 6.2500 mg | ORAL_TABLET | Freq: Two times a day (BID) | ORAL | 1 refills | Status: DC

## 2022-05-01 ENCOUNTER — Other Ambulatory Visit: Payer: Self-pay | Admitting: Cardiovascular Disease

## 2022-05-01 MED ORDER — METOPROLOL SUCCINATE ER 50 MG PO TB24: 50.0000 mg | ORAL_TABLET | Freq: Every day | ORAL | 1 refills | Status: DC

## 2022-05-01 NOTE — Addendum Note (Signed)
Addended by: Rockne Menghini on: 05/01/2022 08:19 AM   Modules accepted: Orders

## 2022-05-27 NOTE — Progress Notes (Signed)
 " Cardiology Office Note:    Date:  05/28/2022   ID:  Stephen Walton, DOB 19-Feb-1956, MRN [id number]  PCP:  [facility]   [facility] Providers Cardiologist:  Debby Sor, MD     Referring MD: [facility]   CC: follow up for CAD  History of Present Illness:    Stephen Walton is a 67 y.o. male with a hx of CAD, hypertension, systolic heart failure, OSA on CPAP, GERD, prostate cancer, BPH, depression, HLD, anxiety.  Initially establish care with Dr. Sor for evaluation of chest pain.  Echo at this time revealed mild LVH with normal systolic and diastolic function.  He had a nuclear perfusion study completed in 2013 which showed normal perfusion.  He had a coronary CT in June 2023 which showed a calcium  score of 70.6, placing him in the 47 percentile for age, sex, race matched controls.  He was admitted to the hospital in August of 2023 and was seen by Dr. Loni who has independently reviewed the previous coronary CT and echocardiogram.  It was felt that his EF was low normal at 50 to 55%.  It was also felt the patient likely has a at least moderate disease in left circumflex artery, however FFR was negative.  Dr. Loni suspected his chest pain was more related to blood pressure spikes.  Imdur  was added during this hospitalization. However if the patient continues to have chest pain, it was recommended to consider coronary angiography with PCI.   Most recently he was evaluated by Scot Ford, PA on 03/19/22, at that time he was doing well from a cardiac perspective.  He did endorse occasional chest  twinges that would last for 1 to 2 seconds.  He presents today for follow-up of his CAD.  He continues to intermittent episodes of chest pain, chest pain has mixed features, can occur when he is sleeping and is relieved with changing positions.  He endorsed yesterday he dug a hole to plant a tree without incident of chest pain however the remainder  of the day he just felt  terrible. He feels strongly that his metoprolol  and amlodipine  are contributing to his feelings of malaise and fatigue. He has also struggled with ED -- previously changed from Coreg  > metoprolol  without change in ED symptoms. He recently stopped his amlodipine  three days ago, he has felt better since stopping his amlodipine . He also endorses palpitations that have awoken him at night. He denies dyspnea, pnd, orthopnea, n, v, dizziness, syncope, edema, weight gain, or early satiety.    Past Medical History:  Diagnosis Date   CAD (coronary artery disease) 10/2021   moderate disease in left circumflex artery,   Chest pain, atypical    Erectile dysfunction    2024   H/O echocardiogram 10/04/2011   EF >55%;mild concentric LVH; normals systolic & diastolic dysfunction; mild aortic stenosis   Heart failure with mildly reduced ejection fraction (HFmrEF) (HCC) 2023   History of nuclear stress test 10/04/2011   low risk; small amount of basal inferior bowel artifact   Hyperlipidemia    Hypertension    OSA on CPAP    OSA on CPAP     Past Surgical History:  Procedure Laterality Date   COLONOSCOPY N/A 11/14/2020   Procedure: COLONOSCOPY;  Surgeon: Mavis Anes, MD;  Location: [facility];  Service: Gastroenterology;  Laterality: N/A;    Current Medications: Current Meds  Medication Sig   alfuzosin  (UROXATRAL ) 10 MG  24 hr tablet Take 1 tablet (10 mg total) by mouth in the morning and at bedtime.   amLODipine  (NORVASC ) 2.5 MG tablet Take 1 tablet (2.5 mg total) by mouth daily.   aspirin  81 MG tablet Take 81 mg by mouth daily.   buPROPion  (WELLBUTRIN  XL) 300 MG 24 hr tablet Take 300 mg by mouth daily.   busPIRone  (BUSPAR ) 15 MG tablet Take 15 mg by mouth at bedtime.   fish oil-omega-3 fatty acids  1000 MG capsule Take 1 g by mouth daily.   metoprolol  succinate (TOPROL -XL) 50 MG 24 hr tablet TAKE 1 TABLET(50 MG) BY MOUTH DAILY WITH OR IMMEDIATELY FOLLOWING A MEAL    nitroGLYCERIN  (NITROSTAT ) 0.4 MG SL tablet Place 1 tablet (0.4 mg total) under the tongue every 5 (five) minutes as needed for chest pain.   NON FORMULARY at bedtime. CPAP   ranolazine  (RANEXA ) 500 MG 12 hr tablet Take 1 tablet (500 mg total) by mouth 2 (two) times daily.   rosuvastatin  (CRESTOR ) 20 MG tablet Take 1 tablet (20 mg total) by mouth daily.   Semaglutide (OZEMPIC, 0.25 OR 0.5 MG/DOSE, ) Inject 0.5 mg into the skin once a week.   testosterone  cypionate (DEPOTESTOSTERONE CYPIONATE) 200 MG/ML injection Inject 0.5 mLs (100 mg total) into the muscle every 7 (seven) days.   valsartan  (DIOVAN ) 40 MG tablet Take 1 tablet (40 mg total) by mouth 2 (two) times daily.     Allergies:   Patient has no known allergies.   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  Vaping Use   Vaping Use: Never used  Substance and Sexual Activity   Alcohol use: Yes    Comment: 1-2 beers per month   Drug use: Never   Sexual activity: Not on file  Other Topics Concern   Not on file  Social History Narrative   Not on file   Social Determinants of Health   Financial Resource Strain: Not on file  Food Insecurity: Not on file  Transportation Needs: Not on file  Physical Activity: Not on file  Stress: Not on file  Social Connections: Not on file     Family History: The patient's family history includes Heart attack in his brother.  ROS:   Please see the history of present illness.    All other systems reviewed and are negative.  EKGs/Labs/Other Studies Reviewed:    The following studies were reviewed today:  Coronary CT 08/28/2021 IMPRESSION: 1. Coronary  calcium  score of 70.6. This was 47th percentile for age-, sex, and race-matched controls.   2.  Normal coronary origin with right dominance.   3.  Mild atherosclerosis of the LAD and LCx.  CAD RADS 2.   4.  Recommend preventive therapy and risk factor modification.       Echo 09/04/2021 1. Left ventricular ejection fraction, by estimation, is 40 to 45%. The  left ventricle has mildly decreased function. The left ventricle has no  regional wall motion abnormalities. There is mild left ventricular  hypertrophy. Left ventricular diastolic  parameters were normal. The average left ventricular  global longitudinal  strain is -13.7 %. The global longitudinal strain is abnormal.   2. Right ventricular systolic function is normal. The right ventricular  size is normal.   3. The mitral valve is normal in structure. Trivial mitral valve  regurgitation.   4. The aortic valve is normal in structure. Aortic valve regurgitation is  not visualized. No aortic stenosis is present.   5. The inferior vena cava is normal in size with greater than 50%  respiratory variability, suggesting right atrial pressure of 3 mmHg.   EKG:  EKG is not ordered today.  Recent Labs: 12/21/2021: ALT 20 02/08/2022: Hemoglobin 15.7; Platelets 334 02/09/2022: BUN 19; Creatinine, Ser 1.40; Potassium 4.9; Sodium 140  Recent Lipid Panel    Component Value Date/Time   CHOL 118 02/08/2022 0547   CHOL 110 05/05/2020 1209   TRIG 61 02/08/2022 0547   HDL 53 02/08/2022 0547   HDL 43 05/05/2020 1209   CHOLHDL 2.2 02/08/2022 0547   VLDL 12 02/08/2022 0547   LDLCALC 53 02/08/2022 0547   LDLCALC 51 05/05/2020 1209     Risk Assessment/Calculations:                Physical Exam:    VS:  BP 118/68 (BP Location: Left Arm, Patient Position: Sitting, Cuff Size: Large)   Pulse 81   Ht 5' 9 (1.753 m)   Wt 251 lb (113.9 kg)   SpO2 94%   BMI 37.07 kg/m     Wt Readings from Last 3 Encounters:  05/28/22 251 lb (113.9  kg)  03/19/22 250 lb 3.2 oz (113.5 kg)  02/12/22 248 lb 12.8 oz (112.9 kg)     GEN: Appears anxious,  Well nourished, well developed in no acute distress HEENT: Normal NECK: No JVD; No carotid bruits LYMPHATICS: No lymphadenopathy CARDIAC: RRR, no murmurs, rubs, gallops RESPIRATORY:  Clear to auscultation without rales, wheezing or rhonchi  ABDOMEN: Soft, non-tender, non-distended MUSCULOSKELETAL:  No edema; No deformity  SKIN: Warm and dry NEUROLOGIC:  Alert and oriented x 3 PSYCHIATRIC:  Normal affect   ASSESSMENT:    1. Coronary artery disease involving native coronary artery of native heart without angina pectoris   2. Hyperlipidemia LDL goal <70   3. OSA on CPAP   4. NICM (nonischemic cardiomyopathy) (HCC)   5. Palpitations   6. Erectile dysfunction, unspecified erectile dysfunction type    PLAN:    In order of problems listed above:  CAD -mild nonobstructive CAD noted on coronary CTA on 08/28/2021, he continues to have chest pain/twinges, the pain has mixed features and can occur at rest and relieved with positional changes. He has not chest pain with exertion. He was unable to tolerate Imdur  d/t HA however his symptoms were much improved on Imdur . Will start Ranexa  500 mg twice/day. Continue ASA, rosuvastatin . He wants to stop his BB to see if this is causing his ED, discussed tapering off of metoprolol .   NICM -echo on 09/04/2021 revealed an EF of 40 to 45%, re-reviewed by Dr. Loni during hospital admission in August and felt to be closer to 50-55%. NYHA class 1, euvolemic.   Palpitations - reports new palpitations that are bothersome, will arrange 7 day Zio. Check CBC, BMET. Recent TSH was normal.   Hypertension -blood pressure today is well controlled @ 118/68, he stopped his amlodipine  and wants to transition off of BB per above. Will start valsartan  40 mg twice daily.   OSA on CPAP -compliant with CPAP.  HLD -  LDL on 02/08/2022 was well-controlled at 53, continue  rosuvastatin .   Erectile dysfunction - New since start of his BB and amlodipine . He stopped his amlodipine . Previously Coreg  was changed to metoprolol  but he states the ED has persisted and would like to stop.    Disposition - CBC, BMET, 7 day Zio, Start Ranexa  500 mg BID. Titrate metoprolol  off at the request of the pt.          Medication Adjustments/Labs and Tests Ordered: Current medicines are reviewed at length with the patient today.  Concerns regarding medicines are outlined above.  Orders Placed This Encounter  Procedures   CBC   Basic metabolic panel   LONG TERM MONITOR (3-14 DAYS)   Meds ordered this encounter  Medications   valsartan  (DIOVAN ) 40 MG tablet    Sig: Take 1 tablet (40 mg total) by mouth 2 (two) times daily.    Dispense:  60 tablet    Refill:  6   ranolazine  (RANEXA ) 500 MG 12 hr tablet    Sig: Take 1 tablet (500 mg total) by mouth 2 (two) times daily.    Dispense:  30 tablet    Refill:  6    Patient Instructions  Medication Instructions:  START VALSARTAN  40MG  TWICE DAILY  START RANEXA  500MG  TWICE DAILY *If you need a refill on your cardiac medications before your next appointment, please call your pharmacy*  Lab Work: CBC AND BMET TODAY If you have labs (blood work) drawn today and your tests are completely normal, you will receive your results only by: MyChart Message (if you have MyChart) OR A paper copy in the mail If you have any lab test that is abnormal or we need to change your treatment, we will call you to review the results.  Testing/Procedures: Your physician has requested you wear a ZIO patch monitor for 7 days. -SEE BELOW   Follow-Up: At [facility], you and your health needs are our priority.  As part of our continuing mission to provide you with exceptional heart care, we have created designated Provider Care Teams.  These Care Teams include your primary Cardiologist (physician) and Advanced Practice Providers (APPs -   Physician Assistants and Nurse Practitioners) who all work together to provide you with the care you need, when you need it.  We recommend signing up for the patient portal called MyChart.  Sign up information is provided on this After Visit Summary.  MyChart is used to connect with patients for Virtual Visits (Telemedicine).  Patients are able to view lab/test results, encounter notes, upcoming appointments, etc.  Non-urgent messages can be sent to your provider as well.   To learn more about what you can do with MyChart, go to [email or web address].    Your next appointment:   END OF APRIL  Provider:   Debby Sor, MD     Other Instructions  PLEASE WEAN OFF YOU METOPROLOL : EVERY-OTHER-DAY FOR 1 WEEK THEN  EVERY THIRD DAY FOR 1 WEEK THEN STOP    ZIO XT- Long Term Monitor Instructions  Your physician has requested you wear a ZIO patch monitor for 7 days.  This is a single patch monitor. [employer] supplies one patch monitor per enrollment. Additional stickers are not available. Please do not apply patch if you will be having a Nuclear Stress Test,  Echocardiogram, Cardiac CT, MRI, or Chest Xray during the period you would be wearing the  monitor. The patch cannot be worn during these tests. You cannot remove and  re-apply the  ZIO XT patch monitor.  Your ZIO patch monitor will be mailed 3 day USPS to your address on file. It may take 3-5 days  to receive your monitor after you have been enrolled.  Once you have received your monitor, please review the enclosed instructions. Your monitor  has already been registered assigning a specific monitor serial # to you.  Billing and Patient Assistance Program Information  We have supplied [employer] with any of your insurance information on file for billing purposes. [employer] offers a sliding scale Patient Assistance Program for patients that do not have  insurance, or whose insurance does not completely cover the cost of the ZIO monitor.  You  must apply for the Patient Assistance Program to qualify for this discounted rate.  To apply, please call [employer] at [phone], select option 4, select option 2, ask to apply for  Patient Assistance Program. Meredeth will ask your household income, and how many people  are in your household. They will quote your out-of-pocket cost based on that information.  [employer] will also be able to set up a 15-month, interest-free payment plan if needed.  Applying the monitor   Shave hair from upper left chest.  Hold abrader disc by orange tab. Rub abrader in 40 strokes over the upper left chest as  indicated in your monitor instructions.  Clean area with 4 enclosed alcohol pads. Let dry.  Apply patch as indicated in monitor instructions. Patch will be placed under collarbone on left  side of chest with arrow pointing upward.  Rub patch adhesive wings for 2 minutes. Remove white label marked 1. Remove the white  label marked 2. Rub patch adhesive wings for 2 additional minutes.  While looking in a mirror, press and release button in center of patch. A small green light will  flash 3-4 times. This will be your only indicator that the monitor has been turned on.  Do not shower for the first 24 hours. You may shower after the first 24 hours.  Press the button if you feel a symptom. You will hear a small click. Record Date, Time and  Symptom in the Patient Logbook.  When you are ready to remove the patch, follow instructions on the last 2 pages of Patient  Logbook. Stick patch monitor onto the last page of Patient Logbook.  Place Patient Logbook in the blue and white box. Use locking tab on box and tape box closed  securely. The blue and white box has prepaid postage on it. Please place it in the mailbox as  soon as possible. Your physician should have your test results approximately 7 days after the  monitor has been mailed back to [facility].  Call [facility] Customer Care at [phone]  if you have questions regarding  your ZIO XT patch monitor. Call them immediately if you see an orange light blinking on your  monitor.  If your monitor falls off in less than 4 days, contact our Monitor department at [phone].  If your monitor becomes loose or falls off after 4 days call [employer] at [phone] for  suggestions on securing your monitor     Signed, Delon JAYSON Hoover, NP  05/28/2022 12:45 PM    [facility] "

## 2022-05-28 ENCOUNTER — Ambulatory Visit (INDEPENDENT_AMBULATORY_CARE_PROVIDER_SITE_OTHER): Payer: Medicare Other

## 2022-05-28 ENCOUNTER — Ambulatory Visit: Payer: Medicare Other | Attending: Cardiovascular Disease | Admitting: Cardiology

## 2022-05-28 ENCOUNTER — Encounter: Payer: Self-pay | Admitting: General Practice

## 2022-05-28 VITALS — BP 118/68 | HR 81 | Ht 69.0 in | Wt 251.0 lb

## 2022-05-28 DIAGNOSIS — I428 Other cardiomyopathies: Secondary | ICD-10-CM | POA: Diagnosis present

## 2022-05-28 DIAGNOSIS — E785 Hyperlipidemia, unspecified: Secondary | ICD-10-CM | POA: Diagnosis present

## 2022-05-28 DIAGNOSIS — N529 Male erectile dysfunction, unspecified: Secondary | ICD-10-CM

## 2022-05-28 DIAGNOSIS — R002 Palpitations: Secondary | ICD-10-CM

## 2022-05-28 DIAGNOSIS — G4733 Obstructive sleep apnea (adult) (pediatric): Secondary | ICD-10-CM | POA: Diagnosis present

## 2022-05-28 DIAGNOSIS — I2510 Atherosclerotic heart disease of native coronary artery without angina pectoris: Secondary | ICD-10-CM | POA: Diagnosis present

## 2022-05-28 MED ORDER — VALSARTAN 40 MG PO TABS: 40.0000 mg | ORAL_TABLET | Freq: Two times a day (BID) | ORAL | 6 refills | Status: DC

## 2022-05-28 MED ORDER — RANOLAZINE ER 500 MG PO TB12: 500.0000 mg | ORAL_TABLET | Freq: Two times a day (BID) | ORAL | 6 refills | Status: DC

## 2022-05-28 NOTE — Patient Instructions (Addendum)
Medication Instructions:  START VALSARTAN '40MG'$  TWICE DAILY  START RANEXA '500MG'$  TWICE DAILY *If you need a refill on your cardiac medications before your next appointment, please call your pharmacy*  Lab Work: CBC AND BMET TODAY If you have labs (blood work) drawn today and your tests are completely normal, you will receive your results only by: Stephen Walton (if you have MyChart) OR A paper copy in the mail If you have any lab test that is abnormal or we need to change your treatment, we will call you to review the results.  Testing/Procedures: Your physician has requested you wear a ZIO patch monitor for 7 days. -SEE BELOW   Follow-Up: At [facility], you and your health needs are our priority.  As part of our continuing mission to provide you with exceptional heart care, we have created designated Provider Care Teams.  These Care Teams include your primary Cardiologist (physician) and Advanced Practice Providers (APPs -  Physician Assistants and Nurse Practitioners) who all work together to provide you with the care you need, when you need it.  We recommend signing up for the patient portal called "MyChart".  Sign up information is provided on this After Visit Summary.  MyChart is used to connect with patients for Virtual Visits (Telemedicine).  Patients are able to view lab/test results, encounter notes, upcoming appointments, etc.  Non-urgent messages can be sent to your provider as well.   To learn more about what you can do with MyChart, go to [email or web address].    Your next appointment:   [place]  Provider:   Shelva Majestic, MD     Other Instructions  PLEASE WEAN OFF YOU METOPROLOL: EVERY-OTHER-DAY FOR 1 WEEK THEN  EVERY THIRD DAY FOR 1 WEEK THEN STOP    ZIO XT- Long Term Monitor Instructions  Your physician has requested you wear a ZIO patch monitor for 7 days.  This is a single patch monitor. [employer] supplies one patch monitor per enrollment.  Additional stickers are not available. Please do not apply patch if you will be having a Nuclear Stress Test,  Echocardiogram, Cardiac CT, MRI, or Chest Xray during the period you would be wearing the  monitor. The patch cannot be worn during these tests. You cannot remove and re-apply the  ZIO XT patch monitor.  Your ZIO patch monitor will be mailed 3 day USPS to your address on file. It may take 3-5 days  to receive your monitor after you have been enrolled.  Once you have received your monitor, please review the enclosed instructions. Your monitor  has already been registered assigning a specific monitor serial # to you.  Billing and Patient Assistance Program Information  We have supplied [employer] with any of your insurance information on file for billing purposes. [employer] offers a sliding scale Patient Assistance Program for patients that do not have  insurance, or whose insurance does not completely cover the cost of the ZIO monitor.  You must apply for the Patient Assistance Program to qualify for this discounted rate.  To apply, please call [employer] at [phone], select option 4, select option 2, ask to apply for  Patient Assistance Program. [name] will ask your household income, and how many people  are in your household. They will quote your out-of-pocket cost based on that information.  [employer] will also be able to set up a 86-month interest-free payment plan if needed.  Applying the monitor   Shave hair from upper left chest.  Hold abrader  disc by orange tab. Rub abrader in 40 strokes over the upper left chest as  indicated in your monitor instructions.  Clean area with 4 enclosed alcohol pads. Let dry.  Apply patch as indicated in monitor instructions. Patch will be placed under collarbone on left  side of chest with arrow pointing upward.  Rub patch adhesive wings for 2 minutes. Remove white label marked "1". Remove the white  label marked "2". Rub patch adhesive wings  for 2 additional minutes.  While looking in a mirror, press and release button in center of patch. A small green light will  flash 3-4 times. This will be your only indicator that the monitor has been turned on.  Do not shower for the first 24 hours. You may shower after the first 24 hours.  Press the button if you feel a symptom. You will hear a small click. Record Date, Time and  Symptom in the Patient Logbook.  When you are ready to remove the patch, follow instructions on the last 2 pages of Patient  Logbook. Stick patch monitor onto the last page of Patient Logbook.  Place Patient Logbook in the blue and white box. Use locking tab on box and tape box closed  securely. The blue and white box has prepaid postage on it. Please place it in the mailbox as  soon as possible. Your physician should have your test results approximately 7 days after the  monitor has been mailed back to [facility].  Call [place] at [phone] if you have questions regarding  your ZIO XT patch monitor. Call them immediately if you see an orange light blinking on your  monitor.  If your monitor falls off in less than 4 days, contact our Monitor department at [phone].  If your monitor becomes loose or falls off after 4 days call [employer] at [phone] for  suggestions on securing your monitor

## 2022-05-28 NOTE — Progress Notes (Unsigned)
Enrolled patient for a 7 day Zio XT monitor to be mailed to patients home  Dr Claiborne Billings to read

## 2022-05-29 ENCOUNTER — Encounter: Payer: Self-pay | Admitting: Urology

## 2022-05-29 LAB — CBC
Hematocrit: 53.7 % — ABNORMAL HIGH (ref 37.5–51.0)
Hemoglobin: 17.1 g/dL (ref 13.0–17.7)
MCH: 26.3 pg — ABNORMAL LOW (ref 26.6–33.0)
MCHC: 31.8 g/dL (ref 31.5–35.7)
MCV: 83 fL (ref 79–97)
Platelets: 329 x10E3/uL (ref 150–450)
RBC: 6.51 x10E6/uL — ABNORMAL HIGH (ref 4.14–5.80)
RDW: 16.3 % — ABNORMAL HIGH (ref 11.6–15.4)
WBC: 12.2 x10E3/uL — ABNORMAL HIGH (ref 3.4–10.8)

## 2022-05-29 LAB — BASIC METABOLIC PANEL WITH GFR
BUN/Creatinine Ratio: 13 (ref 10–24)
BUN: 15 mg/dL (ref 8–27)
CO2: 23 mmol/L (ref 20–29)
Calcium: 9.2 mg/dL (ref 8.6–10.2)
Chloride: 103 mmol/L (ref 96–106)
Creatinine, Ser: 1.17 mg/dL (ref 0.76–1.27)
Glucose: 82 mg/dL (ref 70–99)
Potassium: 5 mmol/L (ref 3.5–5.2)
Sodium: 140 mmol/L (ref 134–144)
eGFR: 68 mL/min/1.73

## 2022-05-30 ENCOUNTER — Ambulatory Visit (INDEPENDENT_AMBULATORY_CARE_PROVIDER_SITE_OTHER): Payer: Medicare Other

## 2022-05-30 ENCOUNTER — Telehealth: Payer: Self-pay | Admitting: *Deleted

## 2022-05-30 DIAGNOSIS — I428 Other cardiomyopathies: Secondary | ICD-10-CM

## 2022-05-30 DIAGNOSIS — N138 Other obstructive and reflux uropathy: Secondary | ICD-10-CM

## 2022-05-30 DIAGNOSIS — C61 Malignant neoplasm of prostate: Secondary | ICD-10-CM

## 2022-05-30 DIAGNOSIS — I2510 Atherosclerotic heart disease of native coronary artery without angina pectoris: Secondary | ICD-10-CM

## 2022-05-30 DIAGNOSIS — N401 Enlarged prostate with lower urinary tract symptoms: Secondary | ICD-10-CM

## 2022-05-30 LAB — URINALYSIS, ROUTINE W REFLEX MICROSCOPIC
Bilirubin, UA: NEGATIVE
Glucose, UA: NEGATIVE
Ketones, UA: NEGATIVE
Leukocytes,UA: NEGATIVE
Nitrite, UA: NEGATIVE
RBC, UA: NEGATIVE
Specific Gravity, UA: 1.03 (ref 1.005–1.030)
Urobilinogen, Ur: 2 mg/dL — ABNORMAL HIGH (ref 0.2–1.0)
pH, UA: 5.5 (ref 5.0–7.5)

## 2022-05-30 LAB — MICROSCOPIC EXAMINATION: Bacteria, UA: NONE SEEN

## 2022-05-30 NOTE — Telephone Encounter (Signed)
Called and made patient aware of ua results.  UA results reviewed with MD No txt is needed at this time.  Patient states he just wanted to rule out infection.

## 2022-05-30 NOTE — Telephone Encounter (Signed)
Released  result to  mychart for review.  Also , RN called patient - no answer - voicemail-  left detailed  lab result message on secure  voicemail.    Requesting patient to read mychart and  giving details of results. BMP ordered for 3 weeks  - - labs are non fasting . Informed patient lab have been place in system -he may come anytime in 3 weeks to do lab.  BMP order placed.  Any question  may call the office

## 2022-05-30 NOTE — Telephone Encounter (Signed)
-----   Message from Trudi Ida, NP sent at 05/29/2022  8:10 AM EDT ----- Stephen Walton, Your blood count appears to be at your baseline. Your white blood cells were slightly elevated. Since you have BPH and prostate cancer, you may want to touch base with your PCP since you are leaving for a cruise soon. May be nothing to be concerned with, but they may just want to check your urine to make sure nothing is going on prior to your cruise.  The rest of your blood work was normal.  I do want to repeat your BMET in 3 weeks (after you get back from cruise), just to make sure the valsartan is doing ok with your kidneys.  Best, Anderson Malta

## 2022-05-30 NOTE — Telephone Encounter (Signed)
See telephone encounter.

## 2022-06-01 LAB — URINE CULTURE

## 2022-06-08 DIAGNOSIS — R002 Palpitations: Secondary | ICD-10-CM | POA: Diagnosis not present

## 2022-06-27 ENCOUNTER — Other Ambulatory Visit: Payer: Medicare Other

## 2022-06-28 ENCOUNTER — Other Ambulatory Visit: Payer: Medicare Other

## 2022-07-04 ENCOUNTER — Other Ambulatory Visit: Payer: Medicare Other

## 2022-07-04 NOTE — Progress Notes (Signed)
Cardiology Office Note:    Date:  07/05/2022   ID:  Stephen Walton, DOB [birth year], MRN [id number]  PCP:  [facility]  [facility] Providers Cardiologist:  Nicki Guadalajara, MD    Referring MD: [facility]   Chief Complaint:  Hypertension    Patient Profile:  CAD 08/28/21 cardiac CTA with Coronary calcium score of 70.6. This was 47th percentile for age-, sex, and race-matched controls. Mild atherosclerosis of the LAD and LCx. CAD RADS 2.    Hypertension Patient with ED on multiple beta blockers. Palpitations with CCB. Not taking medication at this time. Systolic heart failure TTE 09/04/21 noted LVEF 40-45%. During August 2023 admission, independent review by Dr. Jacques Navy felt EF closer to 50% Hyperlipidemia Lab Results  Component Value Date   CHOL 118 02/08/2022   HDL 53 02/08/2022   LDLCALC 53 02/08/2022   TRIG 61 02/08/2022   CHOLHDL 2.2 02/08/2022    OSA on CPAP ED   Cardiac Studies & Procedures     STRESS TESTS  NM MYOCAR MULTI W/SPECT W 09/25/2012   ECHOCARDIOGRAM  ECHOCARDIOGRAM COMPLETE 09/04/2021  Narrative ECHOCARDIOGRAM REPORT    Patient Name:   Stephen Walton Date of Exam: 09/04/2021 Medical Rec #:  [id number]         Height:       69.0 in Accession #:    [id number]        Weight:       247.8 lb Date of Birth:  1956-01-28         BSA:          2.263 m Patient Age:    66 years          BP:           140/85 mmHg Patient Gender: M                 HR:           76 bpm. Exam Location:  Outpatient  Procedure: 2D Echo, Strain Analysis, Cardiac Doppler and Color Doppler  Indications:    Precordial pain  History:        Patient has prior history of Echocardiogram examinations. Risk Factors:Former Smoker and Dyslipidemia. Prostate cancer.  Sonographer:    Jeryl Columbia RDCS Referring Phys: [phone] HAO MENG  IMPRESSIONS   1. Left ventricular ejection fraction, by estimation, is 40 to 45%. The left  ventricle has mildly decreased function. The left ventricle has no regional wall motion abnormalities. There is mild left ventricular hypertrophy. Left ventricular diastolic parameters were normal. The average left ventricular global longitudinal strain is -13.7 %. The global longitudinal strain is abnormal. 2. Right ventricular systolic function is normal. The right ventricular size is normal. 3. The mitral valve is normal in structure. Trivial mitral valve regurgitation. 4. The aortic valve is normal in structure. Aortic valve regurgitation is not visualized. No aortic stenosis is present. 5. The inferior vena cava is normal in size with greater than 50% respiratory variability, suggesting right atrial pressure of 3 mmHg.  FINDINGS Left Ventricle: Left ventricular ejection fraction, by estimation, is 40 to 45%. The left ventricle has mildly decreased function. The left ventricle has no regional wall motion abnormalities. The average left ventricular global longitudinal strain is -13.7 %. The global longitudinal strain is abnormal. The left ventricular internal cavity size was normal in size. There is mild left ventricular hypertrophy. Left ventricular diastolic parameters were normal.  Right Ventricle: The right  ventricular size is normal. Right vetricular wall thickness was not well visualized. Right ventricular systolic function is normal.  Left Atrium: Left atrial size was normal in size.  Right Atrium: Right atrial size was normal in size.  Pericardium: There is no evidence of pericardial effusion.  Mitral Valve: The mitral valve is normal in structure. Trivial mitral valve regurgitation.  Tricuspid Valve: The tricuspid valve is normal in structure. Tricuspid valve regurgitation is trivial.  Aortic Valve: The aortic valve is normal in structure. Aortic valve regurgitation is not visualized. No aortic stenosis is present.  Pulmonic Valve: The pulmonic valve was normal in structure.  Pulmonic valve regurgitation is not visualized.  Aorta: The aortic root and ascending aorta are structurally normal, with no evidence of dilitation.  Venous: The inferior vena cava is normal in size with greater than 50% respiratory variability, suggesting right atrial pressure of 3 mmHg.  IAS/Shunts: The interatrial septum was not well visualized.   LEFT VENTRICLE PLAX 2D LVIDd:         5.01 cm      Diastology LVIDs:         3.41 cm      LV e' medial:    10.00 cm/s LV PW:         1.36 cm      LV E/e' medial:  8.6 LV IVS:        1.06 cm      LV e' lateral:   9.46 cm/s LVOT diam:     2.10 cm      LV E/e' lateral: 9.1 LV SV:         63 LV SV Index:   28           2D Longitudinal Strain LVOT Area:     3.46 cm     2D Strain GLS Avg:     -13.7 %  LV Volumes (MOD) LV vol d, MOD A2C: 150.0 ml 3D Volume EF: LV vol d, MOD A4C: 200.0 ml 3D EF:        44 % LV vol s, MOD A2C: 81.1 ml  LV EDV:       152 ml LV vol s, MOD A4C: 103.0 ml LV ESV:       85 ml LV SV MOD A2C:     68.9 ml  LV SV:        68 ml LV SV MOD A4C:     200.0 ml LV SV MOD BP:      81.6 ml  RIGHT VENTRICLE RV Basal diam:  3.60 cm RV Mid diam:    2.77 cm RV S prime:     12.70 cm/s TAPSE (M-mode): 2.8 cm  LEFT ATRIUM             Index        RIGHT ATRIUM           Index LA diam:        3.70 cm 1.63 cm/m   RA Area:     14.90 cm LA Vol (A2C):   62.9 ml 27.79 ml/m  RA Volume:   35.00 ml  15.47 ml/m LA Vol (A4C):   75.1 ml 33.19 ml/m LA Biplane Vol: 70.7 ml 31.24 ml/m AORTIC VALVE LVOT Vmax:   104.00 cm/s LVOT Vmean:  67.400 cm/s LVOT VTI:    0.181 m  AORTA Ao Root diam: 3.00 cm Ao Asc diam:  3.00 cm  MITRAL VALVE MV Area (PHT): 5.38 cm  SHUNTS MV Decel Time: 141 msec    Systemic VTI:  0.18 m MR Peak grad: 48.2 mmHg    Systemic Diam: 2.10 cm MR Vmax:      347.00 cm/s MV E velocity: 86.50 cm/s MV A velocity: 80.70 cm/s MV E/A ratio:  1.07  Kristeen Miss MD Electronically signed by Kristeen Miss  MD Signature Date/Time: 09/04/2021/5:32:59 PM    Final     CT SCANS  CT CORONARY MORPH W/CTA COR W/SCORE 08/28/2021  Addendum 08/28/2021 11:59 AM ADDENDUM REPORT: 08/28/2021 11:56  ADDENDUM: OVER-READ INTERPRETATION  CT CHEST  The following report is an over-read performed by radiologist Dr. Lovie Chol [facility] on 08/28/2021. This over-read does not include interpretation of cardiac or coronary anatomy or pathology. The coronary calcium and coronary CT angiography interpretation by the cardiologist is attached. Imaging of the chest is focused on cardiac structures and excludes much of the chest on CT.  COMPARISON:  None available  FINDINGS:  Cardiovascular: Please see dedicated report for cardiovascular details.  Mediastinum/Nodes: No acute process or signs of adenopathy in the visualized portions of the mediastinum.  Lungs/Pleura: Visualize lungs are clear and airways are patent.  Upper Abdomen: Incidental imaging of upper abdominal contents shows no acute findings to the extent evaluated.  Musculoskeletal: No acute bone finding. No destructive bone process. Spinal degenerative changes.  IMPRESSION: No acute or significant extracardiac findings.   Electronically Signed By: Donzetta Kohut M.D. On: 08/28/2021 11:56  Narrative CLINICAL DATA:  Chest pain  EXAM: Cardiac/Coronary CTA  TECHNIQUE: A non-contrast, gated CT scan was obtained with axial slices of 3 mm through the heart for calcium scoring. Calcium scoring was performed using the Agatston method. A 120 kV prospective, gated, contrast cardiac scan was obtained. Gantry rotation speed was 250 msecs and collimation was 0.6 mm. Two sublingual nitroglycerin tablets (0.8 mg) were given. The 3D data set was reconstructed in 5% intervals of the 35-75% of the R-R cycle. Diastolic phases were analyzed on a dedicated workstation using MPR, MIP, and VRT modes. The patient received 95 cc  of contrast.  FINDINGS: Image quality: Excellent.  Noise artifact is: Limited.  Coronary Arteries:  Normal coronary origin.  Right dominance.  Left main: The left main is a large caliber vessel with a normal take off from the left coronary cusp that trifurcates into a LAD, LCX, and ramus intermedius. There is no plaque or stenosis.  Left anterior descending artery: The LAD gives off 2 patent diagonal branches. There is mild calcified plaque in the proximal to mid LAD with associated stenosis of 25-49%.  Ramus intermedius: Very small and patent with no evidence of plaque or stenosis.  Left circumflex artery: The LCX is non-dominant and gives off 2 patent obtuse marginal branches. There is mild calcified plaque in the mid LCx with associated stenosis of 25-49%.  Right coronary artery: The RCA is dominant with normal take off from the right coronary cusp. There is no evidence of plaque or stenosis. The RCA terminates as a PDA and right posterolateral branch without evidence of plaque or stenosis.  Right Atrium: Right atrial size is within normal limits.  Right Ventricle: The right ventricular cavity is within normal limits.  Left Atrium: Left atrial size is normal in size with no left atrial appendage filling defect.  Left Ventricle: The ventricular cavity size is within normal limits. There are no stigmata of prior infarction. There is no abnormal filling defect.  Pulmonary arteries: Normal in size without proximal filling  defect.  Pulmonary veins: Normal pulmonary venous drainage.  Pericardium: Normal thickness with no significant effusion or calcium present.  Cardiac valves: The aortic valve is trileaflet without significant calcification. The mitral valve is normal structure without significant calcification.  Aorta: Normal caliber with no significant disease.  Extra-cardiac findings: See attached radiology report for non-cardiac structures.  IMPRESSION: 1.  Coronary calcium score of 70.6. This was 47th percentile for age-, sex, and race-matched controls.  2.  Normal coronary origin with right dominance.  3.  Mild atherosclerosis of the LAD and LCx.  CAD RADS 2.  4.  Recommend preventive therapy and risk factor modification.  RECOMMENDATIONS: 1. CAD-RADS 0: No evidence of CAD (0%). Consider non-atherosclerotic causes of chest pain.  2. CAD-RADS 1: Minimal non-obstructive CAD (0-24%). Consider non-atherosclerotic causes of chest pain. Consider preventive therapy and risk factor modification.  3. CAD-RADS 2: Mild non-obstructive CAD (25-49%). Consider non-atherosclerotic causes of chest pain. Consider preventive therapy and risk factor modification.  4. CAD-RADS 3: Moderate stenosis. Consider symptom-guided anti-ischemic pharmacotherapy as well as risk factor modification per guideline directed care. Additional analysis with CT FFR will be submitted.  5. CAD-RADS 4: Severe stenosis. (70-99% or > 50% left main). Cardiac catheterization or CT FFR is recommended. Consider symptom-guided anti-ischemic pharmacotherapy as well as risk factor modification per guideline directed care. Invasive coronary angiography recommended with revascularization per published guideline statements.  6. CAD-RADS 5: Total coronary occlusion (100%). Consider cardiac catheterization or viability assessment. Consider symptom-guided anti-ischemic pharmacotherapy as well as risk factor modification per guideline directed care.  7. CAD-RADS N: Non-diagnostic study. Obstructive CAD can't be excluded. Alternative evaluation is recommended.  Armanda Magic, MD  . The  interpretation by the cardiologist is attached.  Electronically Signed: By: Armanda Magic M.D. On: 08/28/2021 11:34           History of Present Illness:   STYLES FAMBRO is a 67 y.o. male with the above problem list.  He was last seen by Wallis Bamberg on 05/28/22. [facility]Dr. Tresa Endo in 2013 for evaluation of chest pain. TTE at this time with mild LVH and normal systolic and diastolic function. NM perfusion study showed normal cardiac perfusion. Patient with repeat TTE in June of 2023 due to recurrent chest pain that noted possible reduction in LVEF. However, cardiac CTA only with mild disease and decision made for medical management at that time. Entresto and Coreg started. Entresto switched to Valsartan due to cost. Patient was admitted in August of 2023 with chest pain. During this admission, patient was seen by Dr. Jacques Navy who felt that LVEF on June echo likely closer to 50-55%. Chest pain during that admission felt to be due to hypertension. Imdur added. Patient seen in January of this year by Azalee Course, PA where he continued to report occasional chest "twinges." When patient saw Wallis Bamberg last month, he noted ongoing brief chest discomfort, not exertionally associated. He had previously been switched from Coreg to Metoprolol due to possibly contributing to ED, stated no improvement. At this visit, he felt that Amlodipine was also probably contributing, said he had stopped taking a few days before. He was taken off BB and Amlodipine and started on Valsartan. Given intolerance to Imdur, he was started on Ranexa. Patient also reported palpitations, Zio placed.   Today patient says that he has felt great since last OV. At the suggestion of his wife, he started taking Prilosec to see if some of his chest discomfort might be acid  reflux related. Since he started this, he's only had 2 episodes of the "twinging" chest discomfort. With improved pain, he decided to not start the Ranexa as prescribed last visit. He also did not start Valsartan because he was concerned this would exacerbate ED. Despite not starting these meds, has felt great. He has been extremely active over the last week, mowing his grass, working in his yard, etc and denies any chest pain, dyspnea,  palpitations. Regarding blood pressure, patient reports home readings in the 130s-140s over 80s. Patient is traveling tomorrow to [place] for a week, plans to scuba dive.      Past Medical History:  Diagnosis Date   CAD (coronary artery disease) 10/2021   moderate disease in left circumflex artery,   Chest pain, atypical    Erectile dysfunction    2024   H/O echocardiogram 10/04/2011   EF >55%;mild concentric LVH; normals systolic & diastolic dysfunction; mild aortic stenosis   Heart failure with mildly reduced ejection fraction (HFmrEF) 2023   History of nuclear stress test 10/04/2011   low risk; small amount of basal inferior bowel artifact   Hyperlipidemia    Hypertension    OSA on CPAP    OSA on CPAP    Current Medications: Current Meds  Medication Sig   alfuzosin (UROXATRAL) 10 MG 24 hr tablet Take 1 tablet (10 mg total) by mouth in the morning and at bedtime.   aspirin 81 MG tablet Take 81 mg by mouth daily.   buPROPion (WELLBUTRIN XL) 300 MG 24 hr tablet Take 300 mg by mouth daily.   busPIRone (BUSPAR) 15 MG tablet Take 15 mg by mouth at bedtime.   fish oil-omega-3 fatty acids 1000 MG capsule Take 1 g by mouth daily.   hydrALAZINE (APRESOLINE) 25 MG tablet Take 1 tablet (25 mg total) by mouth 2 (two) times daily.   nitroGLYCERIN (NITROSTAT) 0.4 MG SL tablet Place 1 tablet (0.4 mg total) under the tongue every 5 (five) minutes as needed for chest pain.   NON FORMULARY at bedtime. CPAP   rosuvastatin (CRESTOR) 20 MG tablet Take 1 tablet (20 mg total) by mouth daily.   Semaglutide (OZEMPIC, 0.25 OR 0.5 MG/DOSE, [place]) Inject 0.5 mg into the skin once a week.   testosterone cypionate (DEPOTESTOSTERONE CYPIONATE) 200 MG/ML injection Inject 0.5 mLs (100 mg total) into the muscle every 7 (seven) days.    Allergies:   Patient has no known allergies.   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  Vaping Use   Vaping Use: Never used  Substance and Sexual Activity   Alcohol use: Yes    Comment: 1-2 beers per month   Drug use: Never   Sexual activity: Not on file    Family Hx: The patient's family history includes Heart attack in his brother.  Review of Systems  Cardiovascular:  Positive for chest pain. Negative for dyspnea on exertion, irregular heartbeat, near-syncope, orthopnea, palpitations and syncope. Claudication: "twinging" discomfort x2. Improved since starting PPI. All other systems reviewed and are negative.    EKGs/Labs/Other Test Reviewed:    EKG:  EKG is not ordered today.     Recent Labs: 12/21/2021: ALT 20 05/28/2022: BUN 15; Creatinine, Ser 1.17; Hemoglobin 17.1; Platelets 329; Potassium 5.0; Sodium 140   Recent Lipid Panel  Recent Labs    02/08/22 0547  CHOL 118  TRIG 61  HDL 53  VLDL 12  LDLCALC 53      Risk Assessment/Calculations/Metrics:              Physical Exam:    VS:  BP 132/82 (BP Location: Left Arm, Patient Position: Sitting, Cuff Size: Large)   Pulse 83   Ht 5\' 9"  (1.753 m)   Wt 248 lb 3.2 oz (112.6 kg)   SpO2 98%   BMI 36.65 kg/m     Wt Readings from Last 3 Encounters:  07/05/22 248 lb 3.2 oz (112.6 kg)  05/28/22 251 lb (113.9 kg)  03/19/22 250 lb 3.2 oz (113.5 kg)    Vitals reviewed.  Constitutional:      Appearance: Healthy appearance. Not in distress.  Pulmonary:     Effort: Pulmonary effort is normal.     Breath sounds: Normal breath sounds.  Cardiovascular:     PMI at left midclavicular line. Normal rate. Regular rhythm. Normal S1. Normal S2.      Murmurs: There is no murmur.     No gallop.  No click. No rub.  Pulses:    Intact distal pulses.  Edema:    Peripheral edema absent.  Musculoskeletal: Normal range of motion.     Cervical back: Normal range of motion. Skin:    General: Skin is warm and dry.  Neurological:      General: No focal deficit present.     Mental Status: Alert and oriented to person, place and time.         ASSESSMENT & PLAN:   Benign essential HTN Blood pressure is near goal of 130/80 despite patient being off Amlodipine and Metoprolol and not starting Valsartan. Given recent trend of high normal K (5.0 on 3/12), do not want to start ARB. Patient remains highly concerned about BP medication contributing to ED. Discussed the importance of adequate BP control given slightly decreased LVEF on last echocardiogram. Patient agreeable to trial of hydralazine 25mg  BID. He will keep a daily BP log and we will follow up on home readings in 2 weeks. Return OV in 6 weeks. Regarding patient's travel to [place] tomorrow to scuba dive over the next week, discussed with patient that uncontrolled BP can be dangerous with scuba diving. He was recommended to check BP daily while traveling and to avoid diving if BP greater than 150/47mmHg.   Atypical chest pain Patient reports near total resolution of symptoms since starting a PPI. Since patient has had such notable improvement in symptoms despite not starting Ranexa, will discontinue/remove this from med list. Advised him to continue daily PPI and follow up with PCP about acid reflux management. Patient has been asked to carefully monitor symptoms and report back if he has recurrence of chest discomfort. If pain recurs, would have a low threshold for cardiac PET to evaluate for microvascular disease given only mild atherosclerosis on cardiac CTA.  Hyperlipidemia LDL at goal. Continue Crestor 20mg .  Lab Results  Component Value Date   LDLCALC 53 02/08/2022     Systolic HF (heart failure) (HCC) Patient clinically euvolemic and tolerating physical activity without issue. We will continue to address hypertension as above. Patient wishes to be on as few medications as possible. Given clinical stability, no plans to add additional GDMT at this time.   Obstructive  sleep apnea on CPAP Continue nightly use of CPAP.  Palpitations Patient reports no significant recurrence of palpitations since stopping Amlodipine.  Recent 7 day Zio unremarkable for arrhythmia. Patient will continue to monitor symptoms and report back changes.             Dispo:  No follow-ups on file.   Medication Adjustments/Labs and Tests Ordered: Current medicines are reviewed at length with the patient today.  Concerns regarding medicines are outlined above.  Tests Ordered: No orders of the defined types were placed in this encounter.  Medication Changes: Meds ordered this encounter  Medications   hydrALAZINE (APRESOLINE) 25 MG tablet    Sig: Take 1 tablet (25 mg total) by mouth 2 (two) times daily.    Dispense:  180 tablet    Refill:  3   Signed, Perlie Gold, PA-C  07/05/2022 11:31 AM    [facility] [address] [place], [place], [place]  [address] Phone: [phone]; Fax: [phone]

## 2022-07-05 ENCOUNTER — Encounter: Payer: Self-pay | Admitting: General Practice

## 2022-07-05 ENCOUNTER — Ambulatory Visit: Payer: Medicare Other | Attending: General Practice | Admitting: Cardiology

## 2022-07-05 VITALS — BP 132/82 | HR 83 | Ht 69.0 in | Wt 248.2 lb

## 2022-07-05 DIAGNOSIS — E785 Hyperlipidemia, unspecified: Secondary | ICD-10-CM | POA: Diagnosis not present

## 2022-07-05 DIAGNOSIS — I10 Essential (primary) hypertension: Secondary | ICD-10-CM | POA: Diagnosis not present

## 2022-07-05 DIAGNOSIS — R0989 Other specified symptoms and signs involving the circulatory and respiratory systems: Secondary | ICD-10-CM | POA: Diagnosis not present

## 2022-07-05 DIAGNOSIS — R0789 Other chest pain: Secondary | ICD-10-CM | POA: Insufficient documentation

## 2022-07-05 DIAGNOSIS — R002 Palpitations: Secondary | ICD-10-CM | POA: Insufficient documentation

## 2022-07-05 DIAGNOSIS — G4733 Obstructive sleep apnea (adult) (pediatric): Secondary | ICD-10-CM | POA: Insufficient documentation

## 2022-07-05 DIAGNOSIS — I5022 Chronic systolic (congestive) heart failure: Secondary | ICD-10-CM | POA: Diagnosis present

## 2022-07-05 MED ORDER — HYDRALAZINE HCL 25 MG PO TABS: 25.0000 mg | ORAL_TABLET | Freq: Two times a day (BID) | ORAL | 3 refills | Status: DC

## 2022-07-05 NOTE — Assessment & Plan Note (Signed)
LDL at goal. Continue Crestor .  Lab Results  Component Value Date   LDLCALC 53 02/08/2022

## 2022-07-05 NOTE — Assessment & Plan Note (Signed)
Continue nightly use of CPAP.

## 2022-07-05 NOTE — Assessment & Plan Note (Addendum)
Blood pressure is near goal of 130/80 despite patient being off Amlodipine and Metoprolol and not starting Valsartan. Given recent trend of high normal K (5.0 on 3/12), do not want to start ARB. Patient remains highly concerned about BP medication contributing to ED. Discussed the importance of adequate BP control given slightly decreased LVEF on last echocardiogram. Patient agreeable to trial of hydralazine  BID. He will keep a daily BP log and we will follow up on home readings in 2 weeks. Return OV in 6 weeks. Regarding patient's travel to [place] tomorrow to scuba dive over the next week, discussed with patient that uncontrolled BP can be dangerous with scuba diving. He was recommended to check BP daily while traveling and to avoid diving if BP greater than 150/46mmHg.

## 2022-07-05 NOTE — Assessment & Plan Note (Addendum)
Patient reports near total resolution of symptoms since starting a PPI. Since patient has had such notable improvement in symptoms despite not starting Ranexa, will discontinue/remove this from med list. Advised him to continue daily PPI and follow up with PCP about acid reflux management. Patient has been asked to carefully monitor symptoms and report back if he has recurrence of chest discomfort. If pain recurs, would have a low threshold for cardiac PET to evaluate for microvascular disease given only mild atherosclerosis on cardiac CTA.

## 2022-07-05 NOTE — Assessment & Plan Note (Addendum)
Patient clinically euvolemic and tolerating physical activity without issue. We will continue to address hypertension as above. Patient wishes to be on as few medications as possible. Given clinical stability, no plans to add additional GDMT at this time.

## 2022-07-05 NOTE — Patient Instructions (Signed)
Medication Instructions:   DO NOT START VALSARTAN  START HYDRALAZINE 25 MG ONE TABLET TWICE DAILY  *If you need a refill on your cardiac medications before your next appointment, please call your pharmacy*   Follow-Up: At [facility], you and your health needs are our priority.  As part of our continuing mission to provide you with exceptional heart care, we have created designated Provider Care Teams.  These Care Teams include your primary Cardiologist (physician) and Advanced Practice Providers (APPs -  Physician Assistants and Nurse Practitioners) who all work together to provide you with the care you need, when you need it.  We recommend signing up for the patient portal called "MyChart".  Sign up information is provided on this After Visit Summary.  MyChart is used to connect with patients for Virtual Visits (Telemedicine).  Patients are able to view lab/test results, encounter notes, upcoming appointments, etc.  Non-urgent messages can be sent to your provider as well.   To learn more about what you can do with MyChart, go to [email or web address].    Your next appointment:   6 week(s)  Provider:   ANY APP

## 2022-07-05 NOTE — Assessment & Plan Note (Signed)
Patient reports no significant recurrence of palpitations since stopping Amlodipine. Recent 7 day Zio unremarkable for arrhythmia. Patient will continue to monitor symptoms and report back changes.

## 2022-07-07 LAB — COMPREHENSIVE METABOLIC PANEL
ALT: 18 IU/L (ref 0–44)
AST: 25 IU/L (ref 0–40)
Albumin/Globulin Ratio: 1.8 (ref 1.2–2.2)
Albumin: 4.1 g/dL (ref 3.9–4.9)
Alkaline Phosphatase: 76 IU/L (ref 44–121)
BUN/Creatinine Ratio: 15 (ref 10–24)
BUN: 15 mg/dL (ref 8–27)
Bilirubin Total: 0.6 mg/dL (ref 0.0–1.2)
CO2: 19 mmol/L — ABNORMAL LOW (ref 20–29)
Calcium: 9 mg/dL (ref 8.6–10.2)
Chloride: 103 mmol/L (ref 96–106)
Creatinine, Ser: 0.98 mg/dL (ref 0.76–1.27)
Globulin, Total: 2.3 g/dL (ref 1.5–4.5)
Glucose: 106 mg/dL — ABNORMAL HIGH (ref 70–99)
Potassium: 4.9 mmol/L (ref 3.5–5.2)
Sodium: 139 mmol/L (ref 134–144)
Total Protein: 6.4 g/dL (ref 6.0–8.5)
eGFR: 85 mL/min/{1.73_m2} (ref >59)

## 2022-07-07 LAB — TESTOSTERONE,FREE AND TOTAL
Testosterone, Free: 8.4 pg/mL (ref 6.6–18.1)
Testosterone: 528 ng/dL (ref 264–916)

## 2022-07-07 LAB — CBC WITH DIFFERENTIAL/PLATELET
Basophils Absolute: 0.1 10*3/uL (ref 0.0–0.2)
Basos: 1 %
EOS (ABSOLUTE): 0.4 10*3/uL (ref 0.0–0.4)
Eos: 5 %
Hematocrit: 48 % (ref 37.5–51.0)
Hemoglobin: 15 g/dL (ref 13.0–17.7)
Immature Grans (Abs): 0 10*3/uL (ref 0.0–0.1)
Immature Granulocytes: 0 %
Lymphocytes Absolute: 1.7 10*3/uL (ref 0.7–3.1)
Lymphs: 18 %
MCH: 25.9 pg — ABNORMAL LOW (ref 26.6–33.0)
MCHC: 31.3 g/dL — ABNORMAL LOW (ref 31.5–35.7)
MCV: 83 fL (ref 79–97)
Monocytes Absolute: 0.9 10*3/uL (ref 0.1–0.9)
Monocytes: 10 %
Neutrophils Absolute: 6.2 10*3/uL (ref 1.4–7.0)
Neutrophils: 66 %
Platelets: 331 10*3/uL (ref 150–450)
RBC: 5.8 x10E6/uL (ref 4.14–5.80)
RDW: 14.6 % (ref 11.6–15.4)
WBC: 9.5 10*3/uL (ref 3.4–10.8)

## 2022-07-07 LAB — PSA, TOTAL AND FREE
PSA, Free Pct: 27.3 %
PSA, Free: 1.53 ng/mL
Prostate Specific Ag, Serum: 5.6 ng/mL — ABNORMAL HIGH (ref 0.0–4.0)

## 2022-07-16 ENCOUNTER — Ambulatory Visit: Payer: Medicare Other | Admitting: Cardiovascular Disease

## 2022-07-17 ENCOUNTER — Ambulatory Visit (INDEPENDENT_AMBULATORY_CARE_PROVIDER_SITE_OTHER): Payer: Medicare Other | Admitting: Urology

## 2022-07-17 VITALS — BP 161/95 | HR 90

## 2022-07-17 DIAGNOSIS — E291 Testicular hypofunction: Secondary | ICD-10-CM | POA: Diagnosis not present

## 2022-07-17 DIAGNOSIS — C61 Malignant neoplasm of prostate: Secondary | ICD-10-CM | POA: Diagnosis not present

## 2022-07-17 DIAGNOSIS — R351 Nocturia: Secondary | ICD-10-CM | POA: Diagnosis not present

## 2022-07-17 DIAGNOSIS — N138 Other obstructive and reflux uropathy: Secondary | ICD-10-CM

## 2022-07-17 DIAGNOSIS — N401 Enlarged prostate with lower urinary tract symptoms: Secondary | ICD-10-CM

## 2022-07-17 DIAGNOSIS — R339 Retention of urine, unspecified: Secondary | ICD-10-CM

## 2022-07-17 MED ORDER — SILODOSIN 8 MG PO CAPS: 8.0000 mg | ORAL_CAPSULE | Freq: Every day | ORAL | 11 refills | Status: DC

## 2022-07-17 NOTE — Progress Notes (Signed)
07/17/2022 3:23 PM   Stephen Walton January 15, 1956 [id number]  Referring provider: [facility] [address] [place],  [place] [address]  Followup hypogonadism and BPH   HPI: Stephen Walton is a 67yo here for followup for BPh with nocturia, hypogonadism and prostate cancer. PSA stable at 5.6He developed urinary retention on Friday while in [place] and had a foley placed. He was scuba diving at the time.  He was previously on uroxatral. He was started on flomax and avodart.    PMH: Past Medical History:  Diagnosis Date   CAD (coronary artery disease) 10/2021   moderate disease in left circumflex artery,   Chest pain, atypical    Erectile dysfunction    2024   H/O echocardiogram 10/04/2011   EF >55%;mild concentric LVH; normals systolic & diastolic dysfunction; mild aortic stenosis   Heart failure with mildly reduced ejection fraction (HFmrEF) (HCC) 2023   History of nuclear stress test 10/04/2011   low risk; small amount of basal inferior bowel artifact   Hyperlipidemia    Hypertension    OSA on CPAP    OSA on CPAP     Surgical History: Past Surgical History:  Procedure Laterality Date   COLONOSCOPY N/A 11/14/2020   Procedure: COLONOSCOPY;  Surgeon: Stephen Macho, MD;  [facility];  Service: Gastroenterology;  Laterality: N/A;    Home Medications:  Allergies as of 07/17/2022   No Known Allergies      Medication List        Accurate as of Jul 17, 2022  3:23 PM. If you have any questions, ask your nurse or doctor.          alfuzosin 10 MG 24 hr tablet Commonly known as: UROXATRAL Take 1 tablet (10 mg total) by mouth in the morning and at bedtime.   amLODipine 2.5 MG tablet Commonly known as: NORVASC Take 1 tablet (2.5 mg total) by mouth daily.   aspirin 81 MG tablet Take 81 mg by mouth daily.   buPROPion 300 MG 24 hr tablet Commonly known as: WELLBUTRIN XL Take 300 mg by mouth daily.   busPIRone 15 MG  tablet Commonly known as: BUSPAR Take 15 mg by mouth at bedtime.   fish oil-omega-3 fatty acids 1000 MG capsule Take 1 g by mouth daily.   hydrALAZINE 25 MG tablet Commonly known as: APRESOLINE Take 1 tablet (25 mg total) by mouth 2 (two) times daily.   metoprolol succinate 50 MG 24 hr tablet Commonly known as: TOPROL-XL TAKE 1 TABLET(50 MG) BY MOUTH DAILY WITH OR IMMEDIATELY FOLLOWING A MEAL   nitroGLYCERIN 0.4 MG SL tablet Commonly known as: NITROSTAT Place 1 tablet (0.4 mg total) under the tongue every 5 (five) minutes as needed for chest pain.   NON FORMULARY at bedtime. CPAP   OZEMPIC (0.25 OR 0.5 MG/DOSE) [place] Inject 0.5 mg into the skin once a week.   rosuvastatin 20 MG tablet Commonly known as: CRESTOR Take 1 tablet (20 mg total) by mouth daily.   testosterone cypionate 200 MG/ML injection Commonly known as: DEPOTESTOSTERONE CYPIONATE Inject 0.5 mLs (100 mg total) into the muscle every 7 (seven) days.        Allergies: No Known Allergies  Family History: Family History  Problem Relation Age of Onset   Heart attack Brother     Social History:  reports that he has quit smoking. He has never used smokeless tobacco. He reports current alcohol use. He reports that he does not  use drugs.  ROS: All other review of systems were reviewed and are negative except what is noted above in HPI  Physical Exam: BP (!) 161/95   Pulse 90   Constitutional:  Alert and oriented, No acute distress. HEENT: [place] AT, moist mucus membranes.  Trachea midline, no masses. Cardiovascular: No clubbing, cyanosis, or edema. Respiratory: Normal respiratory effort, no increased work of breathing. GI: Abdomen is soft, nontender, nondistended, no abdominal masses GU: No CVA tenderness.  Lymph: No cervical or inguinal lymphadenopathy. Skin: No rashes, bruises or suspicious lesions. Neurologic: Grossly intact, no focal deficits, moving all 4 extremities. Psychiatric: Normal mood and  affect.  Laboratory Data: Lab Results  Component Value Date   WBC 9.5 07/04/2022   HGB 15.0 07/04/2022   HCT 48.0 07/04/2022   MCV 83 07/04/2022   PLT 331 07/04/2022    Lab Results  Component Value Date   CREATININE 0.98 07/04/2022    Lab Results  Component Value Date   PSA 4.0 10/21/2019   PSA 3.9 03/22/2019    Lab Results  Component Value Date   TESTOSTERONE 528 07/04/2022    No results found for: "HGBA1C"  Urinalysis    Component Value Date/Time   APPEARANCEUR Clear 05/30/2022 1403   GLUCOSEU Negative 05/30/2022 1403   BILIRUBINUR Negative 05/30/2022 1403   KETONESUR small (15) (A) 10/29/2021 1542   PROTEINUR 2+ (A) 05/30/2022 1403   UROBILINOGEN 1.0 10/29/2021 1542   NITRITE Negative 05/30/2022 1403   LEUKOCYTESUR Negative 05/30/2022 1403    Lab Results  Component Value Date   LABMICR See below: 05/30/2022   WBCUA 0-5 05/30/2022   LABEPIT 0-10 05/30/2022   MUCUS Present 07/11/2021   BACTERIA None seen 05/30/2022    Pertinent Imaging:  No results found for this or any previous visit.  No results found for this or any previous visit.  No results found for this or any previous visit.  No results found for this or any previous visit.  Results for orders placed during the hospital encounter of 04/24/15  US Renal  Narrative CLINICAL DATA:  Proteinuria  EXAM: RENAL / URINARY TRACT ULTRASOUND COMPLETE  COMPARISON:  None.  FINDINGS: Right Kidney:  Length: 12.7 cm. Echogenicity within normal limits. No mass or hydronephrosis visualized.  Left Kidney:  Length: 12.0 cm. Echogenicity within normal limits. No mass or hydronephrosis visualized.  Bladder:  Appears normal for degree of bladder distention.  IMPRESSION: Normal renal ultrasound.   Electronically Signed By: Stephen Walton M.D. On: 04/24/2015 09:56  No valid procedures specified. No results found for this or any previous visit.  No results found for this or any  previous visit.   Assessment & Plan:    1. Benign prostatic hyperplasia with urinary obstruction -We will start rapaflo 8mg  daily  2. Hypogonadism male -continue IM testosterone. Followup 6 months with labs  3. Prostate cancer (HCC) Followup 6 months with PSA  4. Nocturia -rapaflo 8mg  daily   No follow-ups on file.  Stephen Aye, MD  [facility]

## 2022-07-18 ENCOUNTER — Ambulatory Visit (INDEPENDENT_AMBULATORY_CARE_PROVIDER_SITE_OTHER): Payer: Medicare Other

## 2022-07-18 DIAGNOSIS — C61 Malignant neoplasm of prostate: Secondary | ICD-10-CM

## 2022-07-18 DIAGNOSIS — R339 Retention of urine, unspecified: Secondary | ICD-10-CM

## 2022-07-18 DIAGNOSIS — N138 Other obstructive and reflux uropathy: Secondary | ICD-10-CM

## 2022-07-18 DIAGNOSIS — E291 Testicular hypofunction: Secondary | ICD-10-CM

## 2022-07-18 DIAGNOSIS — R351 Nocturia: Secondary | ICD-10-CM

## 2022-07-18 DIAGNOSIS — N401 Enlarged prostate with lower urinary tract symptoms: Secondary | ICD-10-CM

## 2022-07-18 LAB — BLADDER SCAN AMB NON-IMAGING: Scan Result: 500

## 2022-07-18 MED ORDER — CIPROFLOXACIN HCL 500 MG PO TABS
500.0000 mg | ORAL_TABLET | Freq: Once | ORAL | Status: AC
  Administered 2022-07-18: 500 mg via ORAL

## 2022-07-18 NOTE — Addendum Note (Signed)
Addended by: Christoper Fabian R on: 07/18/2022 01:58 PM   Modules accepted: Orders

## 2022-07-18 NOTE — Addendum Note (Signed)
Addended by: Christoper Fabian R on: 07/18/2022 03:02 PM   Modules accepted: Orders

## 2022-07-18 NOTE — Progress Notes (Addendum)
Fill and Pull Catheter Removal  Patient is present today for a catheter removal.  Patient was cleaned and prepped in a sterile fashion 125 ml of sterile water/ saline was instilled into the bladder when the patient felt the urge to urinate. 10 ml of water was then drained from the balloon.  A 14 FR foley cath was removed from the bladder no complications were noted .  Patient as then given some time to void on their own.  Patient cannot void  0 ml on their own after some time. Patient  is made aware to increase fluids and return to office today for PVR  Patient tolerated well.  Performed by: Kennyth Lose, CMA  Follow up/ Additional notes: PVR @ 2:30pm   Pt here today for bladder scan. Bladder was scanned and ? was visualized.  Simple Catheter Placement  Due to urinary retention patient is present today for a foley cath placement.  Patient was cleaned and prepped in a sterile fashion with betadine and 2% lidocaine jelly was instilled into the urethra. A 16 FR  coude foley catheter was inserted, urine return was noted  , urine was dark in color.  The balloon was filled with 10cc of sterile water.  A overnight bag was attached for drainage. Patient was also given a night bag to take home and was given instruction on how to change from one bag to another.  Patient was given instruction on proper catheter care.  Patient tolerated well, no complications were noted    Additional notes/ Follow up: No follow-ups on file.    Performed by Kennyth Lose, CMA

## 2022-07-19 ENCOUNTER — Encounter: Payer: Self-pay | Admitting: Urology

## 2022-07-19 NOTE — Patient Instructions (Signed)

## 2022-07-23 ENCOUNTER — Inpatient Hospital Stay (HOSPITAL_COMMUNITY)
Admission: EM | Admit: 2022-07-23 | Discharge: 2022-07-27 | DRG: 698 | Disposition: A | Discharge status: Home / Self Care | Payer: Medicare Other | Attending: Family Medicine | Admitting: Family Medicine

## 2022-07-23 ENCOUNTER — Other Ambulatory Visit: Payer: Self-pay

## 2022-07-23 ENCOUNTER — Encounter (HOSPITAL_COMMUNITY): Payer: Self-pay | Admitting: *Deleted

## 2022-07-23 DIAGNOSIS — T83518A Infection and inflammatory reaction due to other urinary catheter, initial encounter: Principal | ICD-10-CM | POA: Diagnosis present

## 2022-07-23 DIAGNOSIS — Z87891 Personal history of nicotine dependence: Secondary | ICD-10-CM | POA: Diagnosis not present

## 2022-07-23 DIAGNOSIS — R339 Retention of urine, unspecified: Secondary | ICD-10-CM | POA: Diagnosis not present

## 2022-07-23 DIAGNOSIS — N401 Enlarged prostate with lower urinary tract symptoms: Secondary | ICD-10-CM | POA: Diagnosis present

## 2022-07-23 DIAGNOSIS — E785 Hyperlipidemia, unspecified: Secondary | ICD-10-CM | POA: Diagnosis present

## 2022-07-23 DIAGNOSIS — G4733 Obstructive sleep apnea (adult) (pediatric): Secondary | ICD-10-CM

## 2022-07-23 DIAGNOSIS — I110 Hypertensive heart disease with heart failure: Secondary | ICD-10-CM | POA: Diagnosis present

## 2022-07-23 DIAGNOSIS — N390 Urinary tract infection, site not specified: Secondary | ICD-10-CM | POA: Diagnosis not present

## 2022-07-23 DIAGNOSIS — I5020 Unspecified systolic (congestive) heart failure: Secondary | ICD-10-CM | POA: Diagnosis present

## 2022-07-23 DIAGNOSIS — Z8546 Personal history of malignant neoplasm of prostate: Secondary | ICD-10-CM | POA: Diagnosis not present

## 2022-07-23 DIAGNOSIS — Z8249 Family history of ischemic heart disease and other diseases of the circulatory system: Secondary | ICD-10-CM | POA: Diagnosis not present

## 2022-07-23 DIAGNOSIS — Z6836 Body mass index (BMI) 36.0-36.9, adult: Secondary | ICD-10-CM | POA: Diagnosis not present

## 2022-07-23 DIAGNOSIS — R630 Anorexia: Secondary | ICD-10-CM | POA: Diagnosis present

## 2022-07-23 DIAGNOSIS — Z7982 Long term (current) use of aspirin: Secondary | ICD-10-CM

## 2022-07-23 DIAGNOSIS — R338 Other retention of urine: Secondary | ICD-10-CM | POA: Diagnosis present

## 2022-07-23 DIAGNOSIS — A419 Sepsis, unspecified organism: Secondary | ICD-10-CM | POA: Diagnosis present

## 2022-07-23 DIAGNOSIS — F32A Depression, unspecified: Secondary | ICD-10-CM | POA: Diagnosis present

## 2022-07-23 DIAGNOSIS — E291 Testicular hypofunction: Secondary | ICD-10-CM | POA: Diagnosis present

## 2022-07-23 DIAGNOSIS — K219 Gastro-esophageal reflux disease without esophagitis: Secondary | ICD-10-CM | POA: Diagnosis present

## 2022-07-23 DIAGNOSIS — E871 Hypo-osmolality and hyponatremia: Secondary | ICD-10-CM | POA: Diagnosis present

## 2022-07-23 DIAGNOSIS — R6520 Severe sepsis without septic shock: Secondary | ICD-10-CM | POA: Diagnosis present

## 2022-07-23 DIAGNOSIS — A4151 Sepsis due to Escherichia coli [E. coli]: Secondary | ICD-10-CM | POA: Diagnosis present

## 2022-07-23 DIAGNOSIS — Z7985 Long-term (current) use of injectable non-insulin antidiabetic drugs: Secondary | ICD-10-CM | POA: Diagnosis not present

## 2022-07-23 DIAGNOSIS — I2510 Atherosclerotic heart disease of native coronary artery without angina pectoris: Secondary | ICD-10-CM | POA: Diagnosis present

## 2022-07-23 DIAGNOSIS — Z79899 Other long term (current) drug therapy: Secondary | ICD-10-CM | POA: Diagnosis not present

## 2022-07-23 DIAGNOSIS — E669 Obesity, unspecified: Secondary | ICD-10-CM | POA: Diagnosis present

## 2022-07-23 DIAGNOSIS — I5022 Chronic systolic (congestive) heart failure: Secondary | ICD-10-CM | POA: Diagnosis present

## 2022-07-23 DIAGNOSIS — N400 Enlarged prostate without lower urinary tract symptoms: Secondary | ICD-10-CM | POA: Diagnosis present

## 2022-07-23 DIAGNOSIS — I10 Essential (primary) hypertension: Secondary | ICD-10-CM | POA: Diagnosis present

## 2022-07-23 DIAGNOSIS — C61 Malignant neoplasm of prostate: Secondary | ICD-10-CM | POA: Diagnosis present

## 2022-07-23 DIAGNOSIS — Y846 Urinary catheterization as the cause of abnormal reaction of the patient, or of later complication, without mention of misadventure at the time of the procedure: Secondary | ICD-10-CM | POA: Diagnosis present

## 2022-07-23 LAB — CBC WITH DIFFERENTIAL/PLATELET
Abs Immature Granulocytes: 0.09 10*3/uL — ABNORMAL HIGH (ref 0.00–0.07)
Basophils Absolute: 0.1 10*3/uL (ref 0.0–0.1)
Basophils Relative: 1 %
Eosinophils Absolute: 0.3 10*3/uL (ref 0.0–0.5)
Eosinophils Relative: 1 %
HCT: 48.6 % (ref 39.0–52.0)
Hemoglobin: 15.3 g/dL (ref 13.0–17.0)
Immature Granulocytes: 1 %
Lymphocytes Relative: 4 %
Lymphs Abs: 0.7 10*3/uL (ref 0.7–4.0)
MCH: 25.8 pg — ABNORMAL LOW (ref 26.0–34.0)
MCHC: 31.5 g/dL (ref 30.0–36.0)
MCV: 82 fL (ref 80.0–100.0)
Monocytes Absolute: 1.3 10*3/uL — ABNORMAL HIGH (ref 0.1–1.0)
Monocytes Relative: 7 %
Neutro Abs: 17.1 10*3/uL — ABNORMAL HIGH (ref 1.7–7.7)
Neutrophils Relative %: 86 %
Platelets: 328 10*3/uL (ref 150–400)
RBC: 5.93 MIL/uL — ABNORMAL HIGH (ref 4.22–5.81)
RDW: 15.9 % — ABNORMAL HIGH (ref 11.5–15.5)
WBC: 19.6 10*3/uL — ABNORMAL HIGH (ref 4.0–10.5)
nRBC: 0 % (ref 0.0–0.2)

## 2022-07-23 LAB — CULTURE, BLOOD (ROUTINE X 2)

## 2022-07-23 LAB — URINALYSIS, ROUTINE W REFLEX MICROSCOPIC
Bilirubin Urine: NEGATIVE
Glucose, UA: NEGATIVE mg/dL
Ketones, ur: NEGATIVE mg/dL
Nitrite: POSITIVE — AB
Protein, ur: 100 mg/dL — AB
Specific Gravity, Urine: 1.016 (ref 1.005–1.030)
pH: 5 (ref 5.0–8.0)

## 2022-07-23 LAB — BASIC METABOLIC PANEL
Anion gap: 9 (ref 5–15)
BUN: 16 mg/dL (ref 8–23)
CO2: 23 mmol/L (ref 22–32)
Calcium: 8.9 mg/dL (ref 8.9–10.3)
Chloride: 100 mmol/L (ref 98–111)
Creatinine, Ser: 1.11 mg/dL (ref 0.61–1.24)
GFR, Estimated: >60 mL/min (ref >60)
Glucose, Bld: 111 mg/dL — ABNORMAL HIGH (ref 70–99)
Potassium: 4.4 mmol/L (ref 3.5–5.1)
Sodium: 132 mmol/L — ABNORMAL LOW (ref 135–145)

## 2022-07-23 MED ORDER — NITROGLYCERIN 0.4 MG SL SUBL: 0.4000 mg | SUBLINGUAL_TABLET | SUBLINGUAL | Status: DC | PRN

## 2022-07-23 MED ORDER — SODIUM CHLORIDE 0.9 % IV SOLN: 1.0000 g | INTRAVENOUS | Status: DC

## 2022-07-23 MED ORDER — ENOXAPARIN SODIUM 40 MG/0.4ML IJ SOSY
40.0000 mg | PREFILLED_SYRINGE | INTRAMUSCULAR | Status: DC
  Administered 2022-07-23 – 2022-07-26 (×4): 40 mg via SUBCUTANEOUS
  Filled 2022-07-23 (×4): qty 0.4

## 2022-07-23 MED ORDER — ACETAMINOPHEN 325 MG PO TABS
650.0000 mg | ORAL_TABLET | Freq: Four times a day (QID) | ORAL | Status: DC | PRN
  Administered 2022-07-23 – 2022-07-26 (×9): 650 mg via ORAL
  Filled 2022-07-23 (×9): qty 2

## 2022-07-23 MED ORDER — ALFUZOSIN HCL ER 10 MG PO TB24
10.0000 mg | ORAL_TABLET | Freq: Every day | ORAL | Status: DC
  Administered 2022-07-23 – 2022-07-27 (×5): 10 mg via ORAL
  Filled 2022-07-23 (×5): qty 1

## 2022-07-23 MED ORDER — ONDANSETRON HCL 4 MG PO TABS: 4.0000 mg | ORAL_TABLET | Freq: Four times a day (QID) | ORAL | Status: DC | PRN

## 2022-07-23 MED ORDER — ROSUVASTATIN CALCIUM 20 MG PO TABS
20.0000 mg | ORAL_TABLET | Freq: Every day | ORAL | Status: DC
  Filled 2022-07-23: qty 1

## 2022-07-23 MED ORDER — ONDANSETRON HCL 4 MG/2ML IJ SOLN: 4.0000 mg | Freq: Four times a day (QID) | INTRAMUSCULAR | Status: DC | PRN

## 2022-07-23 MED ORDER — ACETAMINOPHEN 650 MG RE SUPP: 650.0000 mg | Freq: Four times a day (QID) | RECTAL | Status: DC | PRN

## 2022-07-23 MED ORDER — BUSPIRONE HCL 15 MG PO TABS
15.0000 mg | ORAL_TABLET | Freq: Every day | ORAL | Status: DC
  Administered 2022-07-23 – 2022-07-26 (×4): 15 mg via ORAL
  Filled 2022-07-23 (×4): qty 1

## 2022-07-23 MED ORDER — ASPIRIN 81 MG PO TBEC
81.0000 mg | DELAYED_RELEASE_TABLET | Freq: Every day | ORAL | Status: DC
  Administered 2022-07-24 – 2022-07-26 (×3): 81 mg via ORAL
  Filled 2022-07-23 (×3): qty 1

## 2022-07-23 MED ORDER — METOPROLOL SUCCINATE ER 50 MG PO TB24
50.0000 mg | ORAL_TABLET | Freq: Every day | ORAL | Status: DC
  Administered 2022-07-23 – 2022-07-27 (×5): 50 mg via ORAL
  Filled 2022-07-23 (×5): qty 1

## 2022-07-23 MED ORDER — ASPIRIN 81 MG PO TBEC
81.0000 mg | DELAYED_RELEASE_TABLET | Freq: Every day | ORAL | Status: DC
  Filled 2022-07-23: qty 1

## 2022-07-23 MED ORDER — BUPROPION HCL ER (XL) 300 MG PO TB24
300.0000 mg | ORAL_TABLET | Freq: Every day | ORAL | Status: DC
  Filled 2022-07-23: qty 1

## 2022-07-23 MED ORDER — OMEGA-3 FATTY ACIDS 1000 MG PO CAPS: 1.0000 g | ORAL_CAPSULE | Freq: Every day | ORAL | Status: DC

## 2022-07-23 MED ORDER — OMEGA-3-ACID ETHYL ESTERS 1 G PO CAPS
1.0000 g | ORAL_CAPSULE | Freq: Every day | ORAL | Status: DC
  Administered 2022-07-24 – 2022-07-27 (×4): 1 g via ORAL
  Filled 2022-07-23 (×4): qty 1

## 2022-07-23 MED ORDER — TAMSULOSIN HCL 0.4 MG PO CAPS
0.8000 mg | ORAL_CAPSULE | Freq: Every day | ORAL | Status: DC
  Filled 2022-07-23: qty 2

## 2022-07-23 MED ORDER — HYDRALAZINE HCL 25 MG PO TABS
25.0000 mg | ORAL_TABLET | Freq: Two times a day (BID) | ORAL | Status: DC
  Administered 2022-07-23 – 2022-07-27 (×8): 25 mg via ORAL
  Filled 2022-07-23 (×9): qty 1

## 2022-07-23 MED ORDER — ROSUVASTATIN CALCIUM 20 MG PO TABS
20.0000 mg | ORAL_TABLET | Freq: Every day | ORAL | Status: DC
  Administered 2022-07-24 – 2022-07-26 (×3): 20 mg via ORAL
  Filled 2022-07-23 (×3): qty 1

## 2022-07-23 MED ORDER — BUPROPION HCL ER (XL) 300 MG PO TB24
300.0000 mg | ORAL_TABLET | Freq: Every day | ORAL | Status: DC
  Administered 2022-07-24 – 2022-07-26 (×3): 300 mg via ORAL
  Filled 2022-07-23 (×3): qty 1

## 2022-07-23 MED ORDER — SODIUM CHLORIDE 0.9 % IV SOLN
1.0000 g | Freq: Once | INTRAVENOUS | Status: AC
  Administered 2022-07-23: 1 g via INTRAVENOUS
  Filled 2022-07-23: qty 10

## 2022-07-23 MED ORDER — TAMSULOSIN HCL 0.4 MG PO CAPS
0.8000 mg | ORAL_CAPSULE | Freq: Every day | ORAL | Status: DC
  Administered 2022-07-23 – 2022-07-26 (×4): 0.8 mg via ORAL
  Filled 2022-07-23 (×4): qty 2

## 2022-07-23 NOTE — H&P (Signed)
History and Physical    Stephen Walton EXB:[id number] DOB: August 30, 1955 DOA: 07/23/2022  PCP: [facility]   Patient coming from: Home  Chief Complaint: Fever/tachycardia  HPI: Stephen Walton is a 67 y.o. male with medical history significant for OSA on CPAP, CAD, obesity, dyslipidemia, BPH, hypogonadism, and prostate cancer with chronic Foley catheter.  He apparently had replacement of his Foley catheter just last week.  He woke up this morning with fever and chills that improved with Tylenol, but was also noted to have some persistent tachycardia.  He also has some mild bilateral lower abdominal pain.   ED Course: Vital signs with temperature 99.6 F and pulse of 113 with leukocytosis of 19,600.  Sodium 132.  Patient started on Rocephin with blood and urine cultures ordered.  Review of Systems: Reviewed as noted above, otherwise negative.  Past Medical History:  Diagnosis Date   CAD (coronary artery disease) 10/2021   moderate disease in left circumflex artery,   Chest pain, atypical    Erectile dysfunction    2024   H/O echocardiogram 10/04/2011   EF >55%;mild concentric LVH; normals systolic & diastolic dysfunction; mild aortic stenosis   Heart failure with mildly reduced ejection fraction (HFmrEF) (HCC) 2023   History of nuclear stress test 10/04/2011   low risk; small amount of basal inferior bowel artifact   Hyperlipidemia    Hypertension    OSA on CPAP    OSA on CPAP     Past Surgical History:  Procedure Laterality Date   COLONOSCOPY N/A 11/14/2020   Procedure: COLONOSCOPY;  Surgeon: Franky Macho, MD;  [facility];  Service: Gastroenterology;  Laterality: N/A;     reports that he has quit smoking. He has never used smokeless tobacco. He reports current alcohol use. He reports that he does not use drugs.  No Known Allergies  Family History  Problem Relation Age of Onset   Heart attack Brother     Prior to Admission medications    Medication Sig Start Date End Date Taking? Authorizing Provider  alfuzosin (UROXATRAL) 10 MG 24 hr tablet Take 1 tablet (10 mg total) by mouth in the morning and at bedtime. 12/26/21   McKenzie, [name], MD  aspirin 81 MG tablet Take 81 mg by mouth daily.    [provider]  buPROPion (WELLBUTRIN XL) 300 MG 24 hr tablet Take 300 mg by mouth daily. 08/18/18   [provider]  busPIRone (BUSPAR) 15 MG tablet Take 15 mg by mouth at bedtime.    [provider]  fish oil-omega-3 fatty acids 1000 MG capsule Take 1 g by mouth daily.    [provider]  hydrALAZINE (APRESOLINE) 25 MG tablet Take 1 tablet (25 mg total) by mouth 2 (two) times daily. 07/05/22   Perlie Gold, PA-C  metoprolol succinate (TOPROL-XL) 50 MG 24 hr tablet TAKE 1 TABLET(50 MG) BY MOUTH DAILY WITH OR IMMEDIATELY FOLLOWING A MEAL 05/01/22   Lennette Bihari, MD  nitroGLYCERIN (NITROSTAT) 0.4 MG SL tablet Place 1 tablet (0.4 mg total) under the tongue every 5 (five) minutes as needed for chest pain. 08/23/21   Azalee Course, PA  NON FORMULARY at bedtime. CPAP    [provider]  rosuvastatin (CRESTOR) 20 MG tablet Take 1 tablet (20 mg total) by mouth daily. 08/23/21   Lennette Bihari, MD  Semaglutide (OZEMPIC, 0.25 OR 0.5 MG/DOSE, [place]) Inject 0.5 mg into the skin once a week.    [provider]  silodosin (RAPAFLO) 8 MG CAPS capsule Take 1 capsule (8 mg total) by mouth daily with breakfast. 07/17/22   McKenzie, [name], MD  testosterone cypionate (DEPOTESTOSTERONE CYPIONATE) 200 MG/ML injection Inject 0.5 mLs (100 mg total) into the muscle every 7 (seven) days. 12/26/21   Malen Gauze, MD    Physical Exam: Vitals:   07/23/22 0815 07/23/22 0830 07/23/22 0900 07/23/22 0915  BP:  (!) 120/57 111/74   Pulse: (!) 117 (!) 113 (!) 110 (!) 109  Resp: 14 16 16  (!) 21  Temp:      TempSrc:      SpO2: 97% 96% 95% 94%  Weight:      Height:        Constitutional: NAD, calm,  comfortable Vitals:   07/23/22 0815 07/23/22 0830 07/23/22 0900 07/23/22 0915  BP:  (!) 120/57 111/74   Pulse: (!) 117 (!) 113 (!) 110 (!) 109  Resp: 14 16 16  (!) 21  Temp:      TempSrc:      SpO2: 97% 96% 95% 94%  Weight:      Height:       Eyes: lids and conjunctivae normal Neck: normal, supple Respiratory: clear to auscultation bilaterally. Normal respiratory effort. No accessory muscle use.  Cardiovascular: Regular rate and rhythm, no murmurs. Abdomen: no tenderness, no distention. Bowel sounds positive.  Musculoskeletal:  No edema. Skin: no rashes, lesions, ulcers.  Psychiatric: Flat affect Foley with dark, yellow urine output  Labs on Admission: I have personally reviewed following labs and imaging studies  CBC: Recent Labs  Lab 07/23/22 0751  WBC 19.6*  NEUTROABS 17.1*  HGB 15.3  HCT 48.6  MCV 82.0  PLT 328   Basic Metabolic Panel: Recent Labs  Lab 07/23/22 0751  NA 132*  K 4.4  CL 100  CO2 23  GLUCOSE 111*  BUN 16  CREATININE 1.11  CALCIUM 8.9   GFR: Estimated Creatinine Clearance: 80.2 mL/min (by C-G formula based on SCr of 1.11 mg/dL). Liver Function Tests: No results for input(s): "AST", "ALT", "ALKPHOS", "BILITOT", "PROT", "ALBUMIN" in the last 168 hours. No results for input(s): "LIPASE", "AMYLASE" in the last 168 hours. No results for input(s): "AMMONIA" in the last 168 hours. Coagulation Profile: No results for input(s): "INR", "PROTIME" in the last 168 hours. Cardiac Enzymes: No results for input(s): "CKTOTAL", "CKMB", "CKMBINDEX", "TROPONINI" in the last 168 hours. BNP (last 3 results) No results for input(s): "PROBNP" in the last 8760 hours. HbA1C: No results for input(s): "HGBA1C" in the last 72 hours. CBG: No results for input(s): "GLUCAP" in the last 168 hours. Lipid Profile: No results for input(s): "CHOL", "HDL", "LDLCALC", "TRIG", "CHOLHDL", "LDLDIRECT" in the last 72 hours. Thyroid Function Tests: No results for input(s):  "TSH", "T4TOTAL", "FREET4", "T3FREE", "THYROIDAB" in the last 72 hours. Anemia Panel: No results for input(s): "VITAMINB12", "FOLATE", "FERRITIN", "TIBC", "IRON", "RETICCTPCT" in the last 72 hours. Urine analysis:    Component Value Date/Time   COLORURINE AMBER (A) 07/23/2022 0802   APPEARANCEUR CLEAR 07/23/2022 0802   APPEARANCEUR Clear 05/30/2022 1403   LABSPEC 1.016 07/23/2022 0802   PHURINE 5.0 07/23/2022 0802   GLUCOSEU NEGATIVE 07/23/2022 0802   HGBUR MODERATE (A) 07/23/2022 0802   BILIRUBINUR NEGATIVE 07/23/2022 0802   BILIRUBINUR Negative 05/30/2022 1403   KETONESUR NEGATIVE 07/23/2022 0802   PROTEINUR 100 (A) 07/23/2022 0802   UROBILINOGEN 1.0 10/29/2021 1542   NITRITE POSITIVE (A) 07/23/2022 0802   LEUKOCYTESUR MODERATE (A) 07/23/2022 0802  Radiological Exams on Admission: No results found.  EKG: Independently reviewed.  ST 131 bpm.  Assessment/Plan Principal Problem:   Sepsis (HCC) Active Problems:   Obstructive sleep apnea on CPAP   Obesity   Hyperlipidemia   Prostate cancer (HCC)   Hypogonadism male   BPH (benign prostatic hyperplasia)   Benign essential HTN   GERD (gastroesophageal reflux disease)   Systolic HF (heart failure) (HCC)    Sepsis secondary to catheter associated UTI, POA Continue on Rocephin as ordered Monitor blood and urine cultures Maintain on IV fluid  Mild hyponatremia IV normal saline for now and monitor in a.m.  OSA CPAP at bedtime  CAD/dyslipidemia Continue rosuvastatin daily Continue aspirin  GERD PPI  HFrEF LVEF 40-45% 08/2021 Currently euvolemic Continue metoprolol  Hypertension Continue metoprolol and hydralazine  BPH Resume home medications  Prostate cancer Follow-up in 6 months with PSA with Dr. McKenzie-urology  Hypogonadism Supplemental testosterone injections outpatient  Obesity BMI 36.92   DVT prophylaxis: Lovenox Code Status: Full Family Communication: None at bedside Disposition  Plan:Admit for UTI Consults called:None Admission status: Inpatient, Tele  Severity of Illness: The appropriate patient status for this patient is INPATIENT. Inpatient status is judged to be reasonable and necessary in order to provide the required intensity of service to ensure the patient's safety. The patient's presenting symptoms, physical exam findings, and initial radiographic and laboratory data in the context of their chronic comorbidities is felt to place them at high risk for further clinical deterioration. Furthermore, it is not anticipated that the patient will be medically stable for discharge from the hospital within 2 midnights of admission.   * I certify that at the point of admission it is my clinical judgment that the patient will require inpatient hospital care spanning beyond 2 midnights from the point of admission due to high intensity of service, high risk for further deterioration and high frequency of surveillance required.*   Tadeusz Stahl D Sylas Twombly DO [facility]  If 7PM-7AM, please contact night-coverage [email or web address]  07/23/2022, 9:15 AM

## 2022-07-23 NOTE — Progress Notes (Signed)
   07/23/22 1009  Assess: MEWS Score  Temp 100.2 F (37.9 C)  BP (!) 140/84  MAP (mmHg) 99  Pulse Rate (!) 113  Resp (!) 22  SpO2 95 %  O2 Device Room Air  Assess: MEWS Score  MEWS Temp 0  MEWS Systolic 0  MEWS Pulse 2  MEWS RR 1  MEWS LOC 0  MEWS Score 3  MEWS Score Color Yellow  Assess: if the MEWS score is Yellow or Red  Were vital signs taken at a resting state? Yes  Focused Assessment Change from prior assessment (see assessment flowsheet)  Does the patient meet 2 or more of the SIRS criteria? Yes  Does the patient have a confirmed or suspected source of infection? Yes  MEWS guidelines implemented  Yes, yellow  Treat  MEWS Interventions Considered administering scheduled or prn medications/treatments as ordered  Take Vital Signs  Increase Vital Sign Frequency  Yellow: Q2hr x1, continue Q4hrs until patient remains green for 12hrs  Escalate  MEWS: Escalate Yellow: Discuss with charge nurse and consider notifying provider and/or RRT  Notify: Charge Nurse/RN  Name of Charge Nurse/RN Notified Morrie Sheldon RN, [place] RN  Provider Notification  Provider Name/Title Maurilio Lovely D. DO  Date Provider Notified 07/23/22  Time Provider Notified 1020  Method of Notification Page (secure chat)  Notification Reason Other (Comment) (yellow MEWs)  Provider response No new orders  Date of Provider Response 07/23/22  Time of Provider Response 1025  Assess: SIRS CRITERIA  SIRS Temperature  0  SIRS Pulse 1  SIRS Respirations  1  SIRS WBC 1  SIRS Score Sum  3

## 2022-07-23 NOTE — ED Notes (Signed)
Dr. Sherryll Burger in room with pt.

## 2022-07-23 NOTE — ED Notes (Signed)
Confirmed with lab that both sets of BC are done prior to starting IV antibiotics.

## 2022-07-23 NOTE — ED Triage Notes (Signed)
Pt with foley catheter in place due to urinary retention, hx of enlarged prostate.  Chills last night.  Pt states fever as high as 102.5 PTA, took two tylenol and AZO as well.

## 2022-07-23 NOTE — ED Notes (Signed)
Lab in room for BCx 2

## 2022-07-23 NOTE — Progress Notes (Signed)
   07/23/22 1225  Assess: MEWS Score  Temp 99 F (37.2 C)  BP 100/70  MAP (mmHg) 79  Pulse Rate 92  Resp 20  SpO2 96 %  O2 Device Room Air  Assess: MEWS Score  MEWS Temp 0  MEWS Systolic 1  MEWS Pulse 0  MEWS RR 0  MEWS LOC 0  MEWS Score 1  MEWS Score Color Green  Assess: SIRS CRITERIA  SIRS Temperature  0  SIRS Pulse 1  SIRS Respirations  0  SIRS WBC 1  SIRS Score Sum  2

## 2022-07-23 NOTE — ED Provider Notes (Signed)
[place] EMERGENCY DEPARTMENT AT [facility] Provider Note   CSN: [id number] Arrival date & time: 07/23/22  4098     History  Chief Complaint  Patient presents with   Hypertension   Fever    Stephen Walton is a 67 y.o. male.  HPI Patient presents with his wife who assists with the history.  History is notable for prostatomegaly, placement of urinary catheter 2 weeks ago with replacement last week.  Patient woke this morning with chills, fever, improved with Tylenol, but with persistent tachycardia presents for evaluation.  He has mild occasional left-sided and right-sided lower abdominal lateral pain.  No chest pain, nausea, vomiting or other complaints.     Home Medications Prior to Admission medications   Medication Sig Start Date End Date Taking? Authorizing Provider  alfuzosin (UROXATRAL) 10 MG 24 hr tablet Take 1 tablet (10 mg total) by mouth in the morning and at bedtime. 12/26/21  Yes McKenzie, [name], MD  aspirin 81 MG tablet Take 81 mg by mouth daily.   Yes [provider]  buPROPion (WELLBUTRIN XL) 300 MG 24 hr tablet Take 300 mg by mouth daily. 08/18/18  Yes [provider]  busPIRone (BUSPAR) 15 MG tablet Take 15 mg by mouth at bedtime.   Yes [provider]  dutasteride (AVODART) 0.5 MG capsule Take 0.5 mg by mouth daily.   Yes [provider]  fish oil-omega-3 fatty acids 1000 MG capsule Take 1 g by mouth daily.   Yes [provider]  hydrALAZINE (APRESOLINE) 25 MG tablet Take 1 tablet (25 mg total) by mouth 2 (two) times daily. 07/05/22  Yes Perlie Gold, PA-C  rosuvastatin (CRESTOR) 20 MG tablet Take 1 tablet (20 mg total) by mouth daily. 08/23/21  Yes Lennette Bihari, MD  Semaglutide (OZEMPIC, 0.25 OR 0.5 MG/DOSE, [place]) Inject 0.5 mg into the skin once a week.   Yes [provider]  silodosin (RAPAFLO) 8 MG CAPS capsule Take 1 capsule (8 mg total) by mouth daily with breakfast. 07/17/22  Yes McKenzie,  [name], MD  testosterone cypionate (DEPOTESTOSTERONE CYPIONATE) 200 MG/ML injection Inject 0.5 mLs (100 mg total) into the muscle every 7 (seven) days. 12/26/21  Yes McKenzie, [name], MD  nitroGLYCERIN (NITROSTAT) 0.4 MG SL tablet Place 1 tablet (0.4 mg total) under the tongue every 5 (five) minutes as needed for chest pain. 08/23/21   Azalee Course, PA  NON FORMULARY at bedtime. CPAP    [provider]      Allergies    Patient has no known allergies.    Review of Systems   Review of Systems  All other systems reviewed and are negative.   Physical Exam Updated Vital Signs BP (!) 140/84 (BP Location: Right Wrist)   Pulse (!) 113   Temp 100.2 F (37.9 C) (Oral)   Resp (!) 22   Ht 5\' 9"  (1.753 m)   Wt 109.4 kg   SpO2 95%   BMI 35.62 kg/m  Physical Exam Vitals and nursing note reviewed.  Constitutional:      General: He is not in acute distress.    Appearance: He is well-developed. He is obese.  HENT:     Head: Normocephalic and atraumatic.  Eyes:     Conjunctiva/sclera: Conjunctivae normal.  Cardiovascular:     Rate and Rhythm: Regular rhythm. Tachycardia present.  Pulmonary:     Effort: Pulmonary effort is normal. No respiratory distress.     Breath sounds: No  stridor.  Abdominal:     General: There is no distension.     Tenderness: There is no abdominal tenderness. There is no guarding.  Genitourinary:    Comments: Foley catheter bag draining Skin:    General: Skin is warm and dry.  Neurological:     Mental Status: He is alert and oriented to person, place, and time.     ED Results / Procedures / Treatments   Labs (all labs ordered are listed, but only abnormal results are displayed) Labs Reviewed  CBC WITH DIFFERENTIAL/PLATELET - Abnormal; Notable for the following components:      Result Value   WBC 19.6 (*)    RBC 5.93 (*)    MCH 25.8 (*)    RDW 15.9 (*)    Neutro Abs 17.1 (*)    Monocytes Absolute 1.3 (*)    Abs Immature Granulocytes 0.09  (*)    All other components within normal limits  BASIC METABOLIC PANEL - Abnormal; Notable for the following components:   Sodium 132 (*)    Glucose, Bld 111 (*)    All other components within normal limits  URINALYSIS, ROUTINE W REFLEX MICROSCOPIC - Abnormal; Notable for the following components:   Color, Urine AMBER (*)    Hgb urine dipstick MODERATE (*)    Protein, ur 100 (*)    Nitrite POSITIVE (*)    Leukocytes,Ua MODERATE (*)    Bacteria, UA RARE (*)    All other components within normal limits  CULTURE, BLOOD (ROUTINE X 2)  CULTURE, BLOOD (ROUTINE X 2)  URINE CULTURE    EKG EKG Interpretation  Date/Time:  Tuesday Jul 23 2022 07:37:37 EDT Ventricular Rate:  131 PR Interval:  150 QRS Duration: 100 QT Interval:  295 QTC Calculation: 436 R Axis:   107 Text Interpretation: Sinus tachycardia Left posterior fascicular block Baseline wander T wave abnormality Abnormal ECG Confirmed by Gerhard Munch [phone]) on 07/23/2022 7:40:29 AM  Radiology No results found.  Procedures Procedures    Medications Ordered in ED Medications  metoprolol succinate (TOPROL-XL) 24 hr tablet 50 mg (50 mg Oral Given 07/23/22 1023)  hydrALAZINE (APRESOLINE) tablet 25 mg (25 mg Oral Not Given 07/23/22 1020)  nitroGLYCERIN (NITROSTAT) SL tablet 0.4 mg (has no administration in time range)  busPIRone (BUSPAR) tablet 15 mg (has no administration in time range)  alfuzosin (UROXATRAL) 24 hr tablet 10 mg (10 mg Oral Given 07/23/22 1025)  enoxaparin (LOVENOX) injection 40 mg (has no administration in time range)  acetaminophen (TYLENOL) tablet 650 mg (650 mg Oral Given 07/23/22 1020)    Or  acetaminophen (TYLENOL) suppository 650 mg ( Rectal See Alternative 07/23/22 1020)  ondansetron (ZOFRAN) tablet 4 mg (has no administration in time range)    Or  ondansetron (ZOFRAN) injection 4 mg (has no administration in time range)  cefTRIAXone (ROCEPHIN) 1 g in sodium chloride 0.9 % 100 mL IVPB (has no administration  in time range)  omega-3 acid ethyl esters (LOVAZA) capsule 1 g (has no administration in time range)  buPROPion (WELLBUTRIN XL) 24 hr tablet 300 mg (has no administration in time range)  aspirin EC tablet 81 mg (has no administration in time range)  rosuvastatin (CRESTOR) tablet 20 mg (has no administration in time range)  tamsulosin (FLOMAX) capsule 0.8 mg (has no administration in time range)  cefTRIAXone (ROCEPHIN) 1 g in sodium chloride 0.9 % 100 mL IVPB (1 g Intravenous New Bag/Given 07/23/22 1324)    ED Course/ Medical Decision Making/ A&P  Medical Decision Making Obese adult male with a history of prostatomegaly, hypertension presents with fevers, chills, tachycardia, concern for bacteremia, sepsis, urinary tract infection.  Other intra-abdominal processes or pneumonia less likely though these are considerations. On continuous monitoring the patient has sinus tach 120s abnormal Pulse ox 100% room air normal   Amount and/or Complexity of Data Reviewed Independent Historian: spouse External Data Reviewed: notes. Labs: ordered. Decision-making details documented in ED Course.  Risk Prescription drug management. Decision regarding hospitalization.   Update: Patient's blood pressure has improved, tachycardia has diminished somewhat.  Findings discussed, reviewed, consistent with catheter associated urinary tract infection with associated sepsis.  Blood cultures, urine cultures added after discussion with internal medicine.  Patient started ceftriaxone IV, and given concern for sepsis, patient was admitted for further monitoring, management.        Final Clinical Impression(s) / ED Diagnoses Final diagnoses:  Severe sepsis (HCC)    CRITICAL CARE Performed by: Gerhard Munch Total critical care time: 35 minutes Critical care time was exclusive of separately billable procedures and treating other patients. Critical care was necessary to treat or  prevent imminent or life-threatening deterioration. Critical care was time spent personally by me on the following activities: development of treatment plan with patient and/or surrogate as well as nursing, discussions with consultants, evaluation of patient's response to treatment, examination of patient, obtaining history from patient or surrogate, ordering and performing treatments and interventions, ordering and review of laboratory studies, ordering and review of radiographic studies, pulse oximetry and re-evaluation of patient's condition.    Gerhard Munch, MD 07/23/22 1200

## 2022-07-24 DIAGNOSIS — A419 Sepsis, unspecified organism: Secondary | ICD-10-CM | POA: Diagnosis not present

## 2022-07-24 LAB — BASIC METABOLIC PANEL
Anion gap: 6 (ref 5–15)
BUN: 22 mg/dL (ref 8–23)
CO2: 25 mmol/L (ref 22–32)
Calcium: 8.5 mg/dL — ABNORMAL LOW (ref 8.9–10.3)
Chloride: 99 mmol/L (ref 98–111)
Creatinine, Ser: 1.3 mg/dL — ABNORMAL HIGH (ref 0.61–1.24)
GFR, Estimated: >60 mL/min (ref >60)
Glucose, Bld: 112 mg/dL — ABNORMAL HIGH (ref 70–99)
Potassium: 4.3 mmol/L (ref 3.5–5.1)
Sodium: 130 mmol/L — ABNORMAL LOW (ref 135–145)

## 2022-07-24 LAB — CBC
HCT: 46.9 % (ref 39.0–52.0)
Hemoglobin: 14.9 g/dL (ref 13.0–17.0)
MCH: 26.2 pg (ref 26.0–34.0)
MCHC: 31.8 g/dL (ref 30.0–36.0)
MCV: 82.4 fL (ref 80.0–100.0)
Platelets: 287 10*3/uL (ref 150–400)
RBC: 5.69 MIL/uL (ref 4.22–5.81)
RDW: 15.8 % — ABNORMAL HIGH (ref 11.5–15.5)
WBC: 22.7 10*3/uL — ABNORMAL HIGH (ref 4.0–10.5)
nRBC: 0 % (ref 0.0–0.2)

## 2022-07-24 LAB — URINE CULTURE

## 2022-07-24 LAB — CULTURE, BLOOD (ROUTINE X 2)

## 2022-07-24 LAB — MAGNESIUM: Magnesium: 1.9 mg/dL (ref 1.7–2.4)

## 2022-07-24 MED ORDER — CHLORHEXIDINE GLUCONATE CLOTH 2 % EX PADS
6.0000 | MEDICATED_PAD | Freq: Every day | CUTANEOUS | Status: DC
  Administered 2022-07-25 – 2022-07-27 (×3): 6 via TOPICAL

## 2022-07-24 MED ORDER — SODIUM CHLORIDE 0.9 % IV SOLN
1.0000 g | Freq: Three times a day (TID) | INTRAVENOUS | Status: DC
  Administered 2022-07-24 – 2022-07-25 (×2): 1 g via INTRAVENOUS
  Filled 2022-07-24 (×2): qty 20

## 2022-07-24 MED ORDER — SODIUM CHLORIDE 0.9 % IV SOLN
2.0000 g | INTRAVENOUS | Status: DC
  Administered 2022-07-24: 2 g via INTRAVENOUS
  Filled 2022-07-24: qty 20

## 2022-07-24 NOTE — Progress Notes (Signed)
[facility] PROGRESS NOTE  Stephen Walton (DOB: 02-Apr-1955) ZOX:[id number] PCP: [facility] Outpatient Specialists: Urology, Dr. Ronne Binning  Brief Narrative: Stephen Walton is a 67 y.o. male with a history of OSA on CPAP, obesity (BMI 35), BPH with recent urinary retention and foley catheterization who presented to the ED on 07/23/2022 with fever and chills with lower abdominal discomfort. He was febrile with leukocytosis (19.6k) and tachycardic. Blood and urine cultures collected and ceftriaxone started for CAUTI.   Subjective: Has continued to have fevers, off and on feeling chills. Still poor appetite.   Objective: BP 118/87 (BP Location: Right Arm)   Pulse 95   Temp (!) 101.9 F (38.8 C) (Axillary)   Resp 20   Ht 5\' 9"  (1.753 m)   Wt 109.4 kg   SpO2 95%   BMI 35.62 kg/m   Gen: No distress Pulm: Clear, nonlabored  CV: RRR, no MRG or edema GI: Soft, NT, ND, +BS  Neuro: Alert and oriented. No new focal deficits. Ext: Warm, no deformities. Skin: No rashes, lesions or ulcers on visualized skin   Assessment & Plan: Principal Problem:   Sepsis (HCC) Active Problems:   Obstructive sleep apnea on CPAP   Obesity   Hyperlipidemia   Prostate cancer (HCC)   Hypogonadism male   BPH (benign prostatic hyperplasia)   Benign essential HTN   GERD (gastroesophageal reflux disease)   Systolic HF (heart failure) (HCC)  Sepsis due to CAUTI:  - Increase ceftriaxone to 2g IV q24h (109kg, CrCl >55ml/min) - Monitor blood cultures (NGTD) and urine culture (E. coli thus far, susceptibilities pending).  - Continue tylenol prn    Subacute urinary retention, BPH: Had failed TOV at urology clinic 5/1.  - Continue formulary equivalent while here, but resume rapaflo 8mg  daily - TOV to be repeated per urology after discharge.   CAD/dyslipidemia: No angina. 08/28/21 cardiac CTA with Coronary calcium score of 70.6. This was 47th percentile for age-, sex, and  race-matched controls. Mild atherosclerosis of the LAD and LCx. CAD RADS 2.  - Continue ASA, lovaza, rosuvastatin  HFrEF, HTN: LVEF 40-45% Aug 2023 official read, overread by Dr. Jacques Navy felt closer to 50%. Euvolemic.  - Do not continue IVF since he's taking po.  - Continue metoprolol, hydralazine. Defer to cardiology as outpatient (has been on several medications/taken off for various reasons), pt opts to minimize medications.   GERD:  - PPI   Prostate cancer - Follow-up in 6 months with PSA (has been stable at 5.6) with Dr. Olga Coaster   Hypogonadism:  - Continue outpatient IM testosterone  Depression:  - Continue bupropion   Morbid obesity: Body mass index is 35.62 kg/m.   OSA:  - Continue CPAP  Hyponatremia:  - Monitor  Tyrone Nine, MD [facility] [email or web address] 07/24/2022, 4:30 PM

## 2022-07-24 NOTE — Progress Notes (Signed)
Pharmacy Antibiotic Note  Stephen Walton is a 68 y.o. male admitted on 07/23/2022 with UTI.  Pharmacy has been consulted for meropenem dosing. Patient started on ceftriaxone but continues to have fevers with no clinical improvement.  Plan: Meropenem 1g IV q8h  Will monitor patient's renal function and clinical improvement  Height: 5\' 9"  (175.3 cm) Weight: 109.4 kg (241 lb 2.9 oz) IBW/kg (Calculated) : 70.7  Temp (24hrs), Avg:101.3 F (38.5 C), Min:98.7 F (37.1 C), Max:103.1 F (39.5 C)  Recent Labs  Lab 07/23/22 0751 07/24/22 0502  WBC 19.6* 22.7*  CREATININE 1.11 1.30*    Estimated Creatinine Clearance: 67.2 mL/min (A) (by C-G formula based on SCr of 1.3 mg/dL (H)).    No Known Allergies  Antimicrobials this admission:  Ceftriaxone 5/8 >> 5/8  Dose adjustments this admission:   Microbiology results: 5/7 BCx: NGTD 5/7 UCx: 30k E coli, pending susceptibilities   Thank you for allowing pharmacy to be a part of this patient's care.  Stephen Walton 07/24/2022 6:49 PM

## 2022-07-24 NOTE — Progress Notes (Signed)
  Transition of Care [facility]) Screening Note   Patient Details  Name: Stephen Walton Date of Birth: 11/07/1955   Transition of Care [facility]) CM/SW Contact:    Villa Herb, LCSWA Phone Number: 07/24/2022, 10:42 AM    Transition of Care Department [facility]) has reviewed patient and no TOC needs have been identified at this time. We will continue to monitor patient advancement through interdisciplinary progression rounds. If new patient transition needs arise, please place a TOC consult.

## 2022-07-24 NOTE — Progress Notes (Signed)
Date and time results received: 07/24/22 1820   Test: Blood culture  Critical Value: Aerobic bottle - gram negative rods  Name of Provider Notified: Jarvis Newcomer  Orders Received? Or Actions Taken?: awaiting response

## 2022-07-25 DIAGNOSIS — R339 Retention of urine, unspecified: Secondary | ICD-10-CM

## 2022-07-25 DIAGNOSIS — A419 Sepsis, unspecified organism: Secondary | ICD-10-CM | POA: Diagnosis not present

## 2022-07-25 DIAGNOSIS — N390 Urinary tract infection, site not specified: Secondary | ICD-10-CM

## 2022-07-25 DIAGNOSIS — N401 Enlarged prostate with lower urinary tract symptoms: Secondary | ICD-10-CM

## 2022-07-25 LAB — CBC
HCT: 42.8 % (ref 39.0–52.0)
Hemoglobin: 13.6 g/dL (ref 13.0–17.0)
MCH: 25.8 pg — ABNORMAL LOW (ref 26.0–34.0)
MCHC: 31.8 g/dL (ref 30.0–36.0)
MCV: 81.2 fL (ref 80.0–100.0)
Platelets: 268 10*3/uL (ref 150–400)
RBC: 5.27 MIL/uL (ref 4.22–5.81)
RDW: 15.9 % — ABNORMAL HIGH (ref 11.5–15.5)
WBC: 12.9 10*3/uL — ABNORMAL HIGH (ref 4.0–10.5)
nRBC: 0 % (ref 0.0–0.2)

## 2022-07-25 LAB — BASIC METABOLIC PANEL
Anion gap: 6 (ref 5–15)
BUN: 19 mg/dL (ref 8–23)
CO2: 23 mmol/L (ref 22–32)
Calcium: 8.5 mg/dL — ABNORMAL LOW (ref 8.9–10.3)
Chloride: 101 mmol/L (ref 98–111)
Creatinine, Ser: 1.06 mg/dL (ref 0.61–1.24)
GFR, Estimated: >60 mL/min (ref >60)
Glucose, Bld: 94 mg/dL (ref 70–99)
Potassium: 4 mmol/L (ref 3.5–5.1)
Sodium: 130 mmol/L — ABNORMAL LOW (ref 135–145)

## 2022-07-25 LAB — BLOOD CULTURE ID PANEL (REFLEXED) - BCID2

## 2022-07-25 LAB — URINE CULTURE: Culture: 30000 — AB

## 2022-07-25 LAB — CULTURE, BLOOD (ROUTINE X 2)

## 2022-07-25 MED ORDER — SODIUM CHLORIDE 0.9 % IV SOLN
2.0000 g | INTRAVENOUS | Status: DC
  Administered 2022-07-25 – 2022-07-27 (×3): 2 g via INTRAVENOUS
  Filled 2022-07-25 (×3): qty 20

## 2022-07-25 NOTE — Consult Note (Signed)
Urology Consult  Referring physician: Dr. Sherryll Burger Reason for referral: urinary retention  Chief Complaint: difficulty urinating  History of Present Illness: Stephen Walton is a 67yo with a history of BPH who was admitted with concern for sepsis after foley replacement for urinary retention. He initially developed difficulty urinate while in [place] and presented to the hospital there. A foley was placed and patient was started on flomax. He failed a voiding trial in my office on Thursday and the foley was replaced. He was given cipro at that time and the flomax was switched to Rapaflo. Monday he developed chills and presented to the ER on Tuesday. Urine culture was obtained which is growing pan sensitive e coli. He feels well toay  Past Medical History:  Diagnosis Date   CAD (coronary artery disease) 10/2021   moderate disease in left circumflex artery,   Chest pain, atypical    Erectile dysfunction    2024   H/O echocardiogram 10/04/2011   EF >55%;mild concentric LVH; normals systolic & diastolic dysfunction; mild aortic stenosis   Heart failure with mildly reduced ejection fraction (HFmrEF) (HCC) 2023   History of nuclear stress test 10/04/2011   low risk; small amount of basal inferior bowel artifact   Hyperlipidemia    Hypertension    OSA on CPAP    OSA on CPAP    Past Surgical History:  Procedure Laterality Date   COLONOSCOPY N/A 11/14/2020   Procedure: COLONOSCOPY;  Surgeon: Franky Macho, MD;  [facility];  Service: Gastroenterology;  Laterality: N/A;    Medications: I have reviewed the patient's current medications. Allergies: No Known Allergies  Family History  Problem Relation Age of Onset   Heart attack Brother    Social History:  reports that he has quit smoking. He has never used smokeless tobacco. He reports current alcohol use. He reports that he does not use drugs.  Review of Systems  Constitutional:  Positive for chills and fatigue.  Genitourinary:   Positive for difficulty urinating.  All other systems reviewed and are negative.   Physical Exam:  Vital signs in last 24 hours: Temp:  [98.7 F (37.1 C)-99.9 F (37.7 C)] 98.9 F (37.2 C) (05/09 0418) Pulse Rate:  [70-80] 70 (05/09 0418) Resp:  [18] 18 (05/08 1700) BP: (106-113)/(65-76) 106/72 (05/09 0418) SpO2:  [95 %-98 %] 98 % (05/09 0418) Physical Exam Vitals reviewed.  Constitutional:      Appearance: Normal appearance.  HENT:     Head: Normocephalic and atraumatic.     Nose: Nose normal. No congestion.     Mouth/Throat:     Mouth: Mucous membranes are moist.  Eyes:     Extraocular Movements: Extraocular movements intact.     Pupils: Pupils are equal, round, and reactive to light.  Cardiovascular:     Rate and Rhythm: Normal rate and regular rhythm.  Abdominal:     General: Abdomen is flat. There is no distension.  Musculoskeletal:        General: No swelling. Normal range of motion.     Cervical back: Normal range of motion and neck supple.  Skin:    General: Skin is warm and dry.  Neurological:     General: No focal deficit present.     Mental Status: He is alert and oriented to person, place, and time.  Psychiatric:        Mood and Affect: Mood normal.        Behavior: Behavior normal.  Thought Content: Thought content normal.        Judgment: Judgment normal.     Laboratory Data:  Results for orders placed or performed during the hospital encounter of 07/23/22 (from the past 72 hour(s))  CBC with Differential     Status: Abnormal   Collection Time: 07/23/22  7:51 AM  Result Value Ref Range   WBC 19.6 (H) 4.0 - 10.5 K/uL   RBC 5.93 (H) 4.22 - 5.81 MIL/uL   Hemoglobin 15.3 13.0 - 17.0 g/dL   HCT 40.9 81.1 - 91.4 %   MCV 82.0 80.0 - 100.0 fL   MCH 25.8 (L) 26.0 - 34.0 pg   MCHC 31.5 30.0 - 36.0 g/dL   RDW 78.2 (H) 95.6 - 21.3 %   Platelets 328 150 - 400 K/uL   nRBC 0.0 0.0 - 0.2 %   Neutrophils Relative % 86 %   Neutro Abs 17.1 (H) 1.7 - 7.7  K/uL   Lymphocytes Relative 4 %   Lymphs Abs 0.7 0.7 - 4.0 K/uL   Monocytes Relative 7 %   Monocytes Absolute 1.3 (H) 0.1 - 1.0 K/uL   Eosinophils Relative 1 %   Eosinophils Absolute 0.3 0.0 - 0.5 K/uL   Basophils Relative 1 %   Basophils Absolute 0.1 0.0 - 0.1 K/uL   Immature Granulocytes 1 %   Abs Immature Granulocytes 0.09 (H) 0.00 - 0.07 K/uL    Comment: Performed at [facility], [address] [place], [place] [address]  Basic metabolic panel     Status: Abnormal   Collection Time: 07/23/22  7:51 AM  Result Value Ref Range   Sodium 132 (L) 135 - 145 mmol/L   Potassium 4.4 3.5 - 5.1 mmol/L   Chloride 100 98 - 111 mmol/L   CO2 23 22 - 32 mmol/L   Glucose, Bld 111 (H) 70 - 99 mg/dL    Comment: Glucose reference range applies only to samples taken after fasting for at least 8 hours.   BUN 16 8 - 23 mg/dL   Creatinine, Ser 8.46 0.61 - 1.24 mg/dL   Calcium 8.9 8.9 - 96.2 mg/dL   GFR, Estimated >95 >28 mL/min    Comment: (NOTE) Calculated using the CKD-EPI Creatinine Equation (2021)    Anion gap 9 5 - 15    Comment: Performed at [facility], [address] [place], [place] [address]  Urinalysis, Routine w reflex microscopic -Urine, Catheterized     Status: Abnormal   Collection Time: 07/23/22  8:02 AM  Result Value Ref Range   Color, Urine AMBER (A) YELLOW    Comment: BIOCHEMICALS MAY BE AFFECTED BY COLOR   APPearance CLEAR CLEAR   Specific Gravity, Urine 1.016 1.005 - 1.030   pH 5.0 5.0 - 8.0   Glucose, UA NEGATIVE NEGATIVE mg/dL   Hgb urine dipstick MODERATE (A) NEGATIVE   Bilirubin Urine NEGATIVE NEGATIVE   Ketones, ur NEGATIVE NEGATIVE mg/dL   Protein, ur 401 (A) NEGATIVE mg/dL   Nitrite POSITIVE (A) NEGATIVE   Leukocytes,Ua MODERATE (A) NEGATIVE   RBC / HPF 21-50 0 - 5 RBC/hpf   WBC, UA 21-50 0 - 5 WBC/hpf   Bacteria, UA RARE (A) NONE SEEN   Squamous Epithelial / HPF 0-5 0 - 5 /HPF   Mucus PRESENT    Hyaline Casts, UA PRESENT     Comment: Performed at [facility], [address], [place], [place] [address]  Urine Culture     Status: Abnormal  Collection Time: 07/23/22  8:02 AM   Specimen: Urine, Catheterized  Result Value Ref Range   Specimen Description      URINE, CATHETERIZED Performed at [facility], [address] [place], [place] [address]    Special Requests      NONE Performed at [facility], [address] [place], [place] [address]    Culture 30,000 COLONIES/mL ESCHERICHIA COLI (A)    Report Status 07/25/2022 FINAL    Organism ID, Bacteria ESCHERICHIA COLI (A)       Susceptibility   Escherichia coli - MIC*    AMPICILLIN 4 SENSITIVE Sensitive     CEFAZOLIN <=4 SENSITIVE Sensitive     CEFEPIME <=0.12 SENSITIVE Sensitive     CEFTRIAXONE <=0.25 SENSITIVE Sensitive     CIPROFLOXACIN <=0.25 SENSITIVE Sensitive     GENTAMICIN <=1 SENSITIVE Sensitive     IMIPENEM <=0.25 SENSITIVE Sensitive     NITROFURANTOIN <=16 SENSITIVE Sensitive     TRIMETH/SULFA <=20 SENSITIVE Sensitive     AMPICILLIN/SULBACTAM <=2 SENSITIVE Sensitive     PIP/TAZO <=4 SENSITIVE Sensitive     * 30,000 COLONIES/mL ESCHERICHIA COLI  Culture, blood (routine x 2)     Status: None (Preliminary result)   Collection Time: 07/23/22  9:19 AM   Specimen: BLOOD LEFT HAND  Result Value Ref Range   Specimen Description BLOOD LEFT HAND    Special Requests      BOTTLES DRAWN AEROBIC AND ANAEROBIC Blood Culture adequate volume   Culture      NO GROWTH 2 DAYS Performed at [facility], [address] [place], [place] [address]    Report Status PENDING   Culture, blood (routine x 2)     Status: Abnormal (Preliminary result)   Collection Time: 07/23/22  9:19 AM   Specimen: Right Antecubital; Blood  Result Value Ref Range   Specimen Description      RIGHT ANTECUBITAL Performed at [facility], [address], [place], [place] [address]    Special Requests      BOTTLES DRAWN AEROBIC AND ANAEROBIC Blood Culture adequate volume Performed at [facility], [address], [place], [place] [address]    Culture  Setup Time      AEROBIC BOTTLE ONLY GRAM NEGATIVE RODS Gram Stain Report Called to,Read Back By and Verified With: CRYSTAL REYNOLDS @ 1820 ON 07/24/22 C VARNER CRITICAL RESULT CALLED TO, READ BACK BY AND VERIFIED WITH: C PITKINS,RN@0157  07/25/22 MK    Culture (A)     ESCHERICHIA COLI CULTURE REINCUBATED FOR BETTER GROWTH Performed at [facility], [address] [place], [place] [address]    Report Status PENDING   Blood Culture ID Panel (Reflexed)     Status: Abnormal   Collection Time: 07/23/22  9:19 AM  Result Value Ref Range   Enterococcus faecalis NOT DETECTED NOT DETECTED   Enterococcus Faecium NOT DETECTED NOT DETECTED   Listeria monocytogenes NOT DETECTED NOT DETECTED   Staphylococcus species NOT DETECTED NOT DETECTED   Staphylococcus aureus (BCID) NOT DETECTED NOT DETECTED   Staphylococcus epidermidis NOT DETECTED NOT DETECTED   Staphylococcus lugdunensis NOT DETECTED NOT DETECTED   Streptococcus species NOT DETECTED NOT DETECTED   Streptococcus agalactiae NOT DETECTED NOT DETECTED   Streptococcus pneumoniae NOT DETECTED NOT DETECTED   Streptococcus pyogenes NOT DETECTED NOT DETECTED   A.calcoaceticus-baumannii NOT DETECTED NOT DETECTED   Bacteroides fragilis NOT DETECTED NOT DETECTED   Enterobacterales DETECTED (A) NOT DETECTED    Comment: Enterobacterales represent a large order  of gram negative bacteria, not a single organism. CRITICAL RESULT CALLED TO, READ BACK BY AND VERIFIED WITH: C PIPKINS,RN@0155  07/25/22 MK    Enterobacter cloacae complex NOT DETECTED NOT DETECTED   Escherichia coli DETECTED (A) NOT DETECTED    Comment: CRITICAL RESULT CALLED TO, READ BACK BY AND VERIFIED WITH: C PIPKINS,RN@0155  07/25/22 MK    Klebsiella aerogenes NOT DETECTED NOT DETECTED   Klebsiella oxytoca NOT DETECTED NOT DETECTED   Klebsiella pneumoniae NOT DETECTED NOT DETECTED   Proteus species NOT DETECTED NOT DETECTED   Salmonella  species NOT DETECTED NOT DETECTED   Serratia marcescens NOT DETECTED NOT DETECTED   Haemophilus influenzae NOT DETECTED NOT DETECTED   Neisseria meningitidis NOT DETECTED NOT DETECTED   Pseudomonas aeruginosa NOT DETECTED NOT DETECTED   Stenotrophomonas maltophilia NOT DETECTED NOT DETECTED   Candida albicans NOT DETECTED NOT DETECTED   Candida auris NOT DETECTED NOT DETECTED   Candida glabrata NOT DETECTED NOT DETECTED   Candida krusei NOT DETECTED NOT DETECTED   Candida parapsilosis NOT DETECTED NOT DETECTED   Candida tropicalis NOT DETECTED NOT DETECTED   Cryptococcus neoformans/gattii NOT DETECTED NOT DETECTED   CTX-M ESBL NOT DETECTED NOT DETECTED   Carbapenem resistance IMP NOT DETECTED NOT DETECTED   Carbapenem resistance KPC NOT DETECTED NOT DETECTED   Carbapenem resistance NDM NOT DETECTED NOT DETECTED   Carbapenem resist OXA 48 LIKE NOT DETECTED NOT DETECTED   Carbapenem resistance VIM NOT DETECTED NOT DETECTED    Comment: Performed at [facility], [address] [place], [place] [address]  Magnesium     Status: None   Collection Time: 07/24/22  5:02 AM  Result Value Ref Range   Magnesium 1.9 1.7 - 2.4 mg/dL    Comment: Performed at [facility], [address], [place], [place] [address]  Basic metabolic panel     Status: Abnormal   Collection Time: 07/24/22  5:02 AM  Result Value Ref Range   Sodium 130 (L) 135 - 145 mmol/L   Potassium 4.3 3.5 - 5.1 mmol/L   Chloride 99 98 - 111 mmol/L   CO2 25 22 - 32 mmol/L   Glucose, Bld 112 (H) 70 - 99 mg/dL    Comment: Glucose reference range applies only to samples taken after fasting for at least 8 hours.   BUN 22 8 - 23 mg/dL   Creatinine, Ser 0.98 (H) 0.61 - 1.24 mg/dL   Calcium 8.5 (L) 8.9 - 10.3 mg/dL   GFR, Estimated >11 >91 mL/min    Comment: (NOTE) Calculated using the CKD-EPI Creatinine Equation (2021)    Anion gap 6 5 - 15    Comment: Performed at [facility], [address], [place], [place] [address]  CBC      Status: Abnormal   Collection Time: 07/24/22  5:02 AM  Result Value Ref Range   WBC 22.7 (H) 4.0 - 10.5 K/uL   RBC 5.69 4.22 - 5.81 MIL/uL   Hemoglobin 14.9 13.0 - 17.0 g/dL   HCT 56.2 13.0 - 86.5 %   MCV 82.4 80.0 - 100.0 fL   MCH 26.2 26.0 - 34.0 pg   MCHC 31.8 30.0 - 36.0 g/dL   RDW 78.4 (H) 69.6 - 29.5 %   Platelets 287 150 - 400 K/uL   nRBC 0.0 0.0 - 0.2 %    Comment: Performed at [facility], [address], [place], [place] [address]  Basic metabolic panel     Status: Abnormal   Collection Time:  07/25/22  4:08 AM  Result Value Ref Range   Sodium 130 (L) 135 - 145 mmol/L   Potassium 4.0 3.5 - 5.1 mmol/L   Chloride 101 98 - 111 mmol/L   CO2 23 22 - 32 mmol/L   Glucose, Bld 94 70 - 99 mg/dL    Comment: Glucose reference range applies only to samples taken after fasting for at least 8 hours.   BUN 19 8 - 23 mg/dL   Creatinine, Ser 1.91 0.61 - 1.24 mg/dL   Calcium 8.5 (L) 8.9 - 10.3 mg/dL   GFR, Estimated >47 >82 mL/min    Comment: (NOTE) Calculated using the CKD-EPI Creatinine Equation (2021)    Anion gap 6 5 - 15    Comment: Performed at [facility], [address], [place], [place] [address]  CBC     Status: Abnormal   Collection Time: 07/25/22  4:08 AM  Result Value Ref Range   WBC 12.9 (H) 4.0 - 10.5 K/uL   RBC 5.27 4.22 - 5.81 MIL/uL   Hemoglobin 13.6 13.0 - 17.0 g/dL   HCT 30.8 65.7 - 84.6 %   MCV 81.2 80.0 - 100.0 fL   MCH 25.8 (L) 26.0 - 34.0 pg   MCHC 31.8 30.0 - 36.0 g/dL   RDW 96.2 (H) 95.2 - 84.1 %   Platelets 268 150 - 400 K/uL   nRBC 0.0 0.0 - 0.2 %    Comment: Performed at [facility], [address], [place], [place] [address]   Recent Results (from the past 240 hour(s))  Urine Culture     Status: Abnormal   Collection Time: 07/23/22  8:02 AM   Specimen: Urine, Catheterized  Result Value Ref Range Status   Specimen Description   Final    URINE, CATHETERIZED Performed at [facility], [address] [place], [place] [address]    Special  Requests   Final    NONE Performed at [facility], [address], [place], [place] [address]    Culture 30,000 COLONIES/mL ESCHERICHIA COLI (A)  Final   Report Status 07/25/2022 FINAL  Final   Organism ID, Bacteria ESCHERICHIA COLI (A)  Final      Susceptibility   Escherichia coli - MIC*    AMPICILLIN 4 SENSITIVE Sensitive     CEFAZOLIN <=4 SENSITIVE Sensitive     CEFEPIME <=0.12 SENSITIVE Sensitive     CEFTRIAXONE <=0.25 SENSITIVE Sensitive     CIPROFLOXACIN <=0.25 SENSITIVE Sensitive     GENTAMICIN <=1 SENSITIVE Sensitive     IMIPENEM <=0.25 SENSITIVE Sensitive     NITROFURANTOIN <=16 SENSITIVE Sensitive     TRIMETH/SULFA <=20 SENSITIVE Sensitive     AMPICILLIN/SULBACTAM <=2 SENSITIVE Sensitive     PIP/TAZO <=4 SENSITIVE Sensitive     * 30,000 COLONIES/mL ESCHERICHIA COLI  Culture, blood (routine x 2)     Status: None (Preliminary result)   Collection Time: 07/23/22  9:19 AM   Specimen: BLOOD LEFT HAND  Result Value Ref Range Status   Specimen Description BLOOD LEFT HAND  Final   Special Requests   Final    BOTTLES DRAWN AEROBIC AND ANAEROBIC Blood Culture adequate volume   Culture   Final    NO GROWTH 2 DAYS Performed at [facility], [address], [place], [place] [address]    Report Status PENDING  Incomplete  Culture, blood (routine x 2)     Status: Abnormal (Preliminary result)   Collection Time: 07/23/22  9:19 AM   Specimen: Right Antecubital;  Blood  Result Value Ref Range Status   Specimen Description   Final    RIGHT ANTECUBITAL Performed at [facility], [address], [place], [place] [address]    Special Requests   Final    BOTTLES DRAWN AEROBIC AND ANAEROBIC Blood Culture adequate volume Performed at [facility], [address], [place], [place] [address]    Culture  Setup Time   Final    AEROBIC BOTTLE ONLY GRAM NEGATIVE RODS Gram Stain Report Called to,Read Back By and Verified With: CRYSTAL REYNOLDS @ 1820 ON 07/24/22 C VARNER CRITICAL RESULT CALLED  TO, READ BACK BY AND VERIFIED WITH: C PITKINS,RN@0157  07/25/22 MK    Culture (A)  Final    ESCHERICHIA COLI CULTURE REINCUBATED FOR BETTER GROWTH Performed at [facility], [address], [place], [place] [address]    Report Status PENDING  Incomplete  Blood Culture ID Panel (Reflexed)     Status: Abnormal   Collection Time: 07/23/22  9:19 AM  Result Value Ref Range Status   Enterococcus faecalis NOT DETECTED NOT DETECTED Final   Enterococcus Faecium NOT DETECTED NOT DETECTED Final   Listeria monocytogenes NOT DETECTED NOT DETECTED Final   Staphylococcus species NOT DETECTED NOT DETECTED Final   Staphylococcus aureus (BCID) NOT DETECTED NOT DETECTED Final   Staphylococcus epidermidis NOT DETECTED NOT DETECTED Final   Staphylococcus lugdunensis NOT DETECTED NOT DETECTED Final   Streptococcus species NOT DETECTED NOT DETECTED Final   Streptococcus agalactiae NOT DETECTED NOT DETECTED Final   Streptococcus pneumoniae NOT DETECTED NOT DETECTED Final   Streptococcus pyogenes NOT DETECTED NOT DETECTED Final   A.calcoaceticus-baumannii NOT DETECTED NOT DETECTED Final   Bacteroides fragilis NOT DETECTED NOT DETECTED Final   Enterobacterales DETECTED (A) NOT DETECTED Final    Comment: Enterobacterales represent a large order of gram negative bacteria, not a single organism. CRITICAL RESULT CALLED TO, READ BACK BY AND VERIFIED WITH: C PIPKINS,RN@0155  07/25/22 MK    Enterobacter cloacae complex NOT DETECTED NOT DETECTED Final   Escherichia coli DETECTED (A) NOT DETECTED Final    Comment: CRITICAL RESULT CALLED TO, READ BACK BY AND VERIFIED WITH: C PIPKINS,RN@0155  07/25/22 MK    Klebsiella aerogenes NOT DETECTED NOT DETECTED Final   Klebsiella oxytoca NOT DETECTED NOT DETECTED Final   Klebsiella pneumoniae NOT DETECTED NOT DETECTED Final   Proteus species NOT DETECTED NOT DETECTED Final   Salmonella species NOT DETECTED NOT DETECTED Final   Serratia marcescens NOT DETECTED NOT  DETECTED Final   Haemophilus influenzae NOT DETECTED NOT DETECTED Final   Neisseria meningitidis NOT DETECTED NOT DETECTED Final   Pseudomonas aeruginosa NOT DETECTED NOT DETECTED Final   Stenotrophomonas maltophilia NOT DETECTED NOT DETECTED Final   Candida albicans NOT DETECTED NOT DETECTED Final   Candida auris NOT DETECTED NOT DETECTED Final   Candida glabrata NOT DETECTED NOT DETECTED Final   Candida krusei NOT DETECTED NOT DETECTED Final   Candida parapsilosis NOT DETECTED NOT DETECTED Final   Candida tropicalis NOT DETECTED NOT DETECTED Final   Cryptococcus neoformans/gattii NOT DETECTED NOT DETECTED Final   CTX-M ESBL NOT DETECTED NOT DETECTED Final   Carbapenem resistance IMP NOT DETECTED NOT DETECTED Final   Carbapenem resistance KPC NOT DETECTED NOT DETECTED Final   Carbapenem resistance NDM NOT DETECTED NOT DETECTED Final   Carbapenem resist OXA 48 LIKE NOT DETECTED NOT DETECTED Final   Carbapenem resistance VIM NOT DETECTED NOT DETECTED Final    Comment: Performed at [facility], [address] [place], [place] [address]   Creatinine: Recent Labs    07/23/22 0751 07/24/22 0502 07/25/22 0408  CREATININE 1.11 1.30* 1.06   Baseline Creatinine: 1  Impression/Assessment:  67yo with urinary retention and UTI  Plan:  Urinary retention: I discussed the management of urinary retention with the patient. We will continue rapalfo 8mg  daily and he will followup in 1 week for a voiding trial UTI with sepsis: The patient can be transitioned to cipro pending his blood cultures  Wilkie Aye 07/25/2022, 12:39 PM

## 2022-07-25 NOTE — Progress Notes (Signed)
[facility] PROGRESS NOTE  SASCHA BONDER (DOB: Sep 20, 1955) ZOX:[id number] PCP: [facility] Outpatient Specialists: Urology, Dr. Ronne Binning  Brief Narrative: Stephen Walton is a 67 y.o. male with a history of OSA on CPAP, obesity (BMI 35), BPH with recent urinary retention and foley catheterization who presented to the ED on 07/23/2022 with fever and chills with lower abdominal discomfort. He was febrile with leukocytosis (19.6k) and tachycardic. Blood and urine cultures collected and ceftriaxone started for CAUTI.   Subjective: Still feeling chills, but much less than 24 hours ago. Eating a bit better. Has no new complaints.   Objective: BP 106/72 (BP Location: Right Arm)   Pulse 70   Temp 98.9 F (37.2 C)   Resp 18   Ht 5\' 9"  (1.753 m)   Wt 109.4 kg   SpO2 98%   BMI 35.62 kg/m   Gen: No distress Pulm: Clear, nonlabored  CV: RRR, no MRG GI: Soft, NT, ND, +BS Neuro: Alert and oriented. No new focal deficits. Ext: Warm, no deformities. Skin: Taped catheter tubing to right thigh. No other rashes, lesions or ulcers on visualized skin   Assessment & Plan: Principal Problem:   Sepsis (HCC) Active Problems:   Obstructive sleep apnea on CPAP   Obesity   Hyperlipidemia   Prostate cancer (HCC)   Hypogonadism male   BPH (benign prostatic hyperplasia)   Benign essential HTN   GERD (gastroesophageal reflux disease)   Systolic HF (heart failure) (HCC)  Sepsis due to CAUTI: With ongoing fevers after admission, though was given 1g CTX. This was changed to meropenem shortly on 5/8 given continued fevers and positive blood cultures, though the E. coli in blood culture (1 of 4 bottles) are likely the same as the E. coli in the urine culture which is pansensitive. Will transition back to ceftriaxone 2g IV q24h and monitor. WBC improving, fever curve improved.  - Continue tylenol prn    Subacute urinary retention, BPH: Had failed TOV at urology clinic 5/1.  -  Continue formulary equivalent while here, but resume rapaflo 8mg  daily - TOV to be repeated per urology after discharge.   CAD/dyslipidemia: No angina. 08/28/21 cardiac CTA with Coronary calcium score of 70.6. This was 47th percentile for age-, sex, and race-matched controls. Mild atherosclerosis of the LAD and LCx. CAD RADS 2.  - Continue ASA, lovaza, rosuvastatin  HFrEF, HTN: LVEF 40-45% Aug 2023 official read, overread by Dr. Jacques Navy felt closer to 50%. Euvolemic.   - Continue metoprolol, hydralazine. Defer to cardiology as outpatient (has been on several medications/taken off for various reasons), pt opts to minimize medications.   GERD:  - PPI   Prostate cancer - Follow-up in 6 months with PSA (has been stable at 5.6) with Dr. Olga Coaster   Hypogonadism:  - Continue outpatient IM testosterone  Depression:  - Continue bupropion   Morbid obesity: Body mass index is 35.62 kg/m.   OSA:  - Continue CPAP  Hyponatremia:  - Monitor, consider work up if worsening  Tyrone Nine, MD [facility] [email or web address] 07/25/2022, 10:46 AM

## 2022-07-26 DIAGNOSIS — A419 Sepsis, unspecified organism: Secondary | ICD-10-CM | POA: Diagnosis not present

## 2022-07-26 NOTE — Care Management Important Message (Signed)
Important Message  Patient Details  Name: Stephen Walton MRN: [id number] Date of Birth: 17-Aug-1955   Medicare Important Message Given:  Yes     Corey Harold 07/26/2022, 10:19 AM

## 2022-07-26 NOTE — Progress Notes (Signed)
[facility] PROGRESS NOTE  Stephen Walton (DOB: May 23, 1955) HYQ:[id number] PCP: [facility] Outpatient Specialists: Urology, Dr. Ronne Binning  Brief Narrative: Stephen Walton is a 67 y.o. male with a history of OSA on CPAP, obesity (BMI 35), BPH with recent urinary retention and foley catheterization who presented to the ED on 07/23/2022 with fever and chills with lower abdominal discomfort. He was febrile with leukocytosis (19.6k) and tachycardic. Blood and urine cultures collected and ceftriaxone started for CAUTI.   Subjective: Feeling a bit better everyday, still chills and fever yesterday.   Objective: BP 133/85 (BP Location: Right Arm)   Pulse 75   Temp 98.9 F (37.2 C)   Resp 18   Ht 5\' 9"  (1.753 m)   Wt 109.4 kg   SpO2 97%   BMI 35.62 kg/m   Gen: No distress Pulm: Clear, nonlabored  CV: RRR, no MRG or edema GI: Soft, NT, ND, +BS  Neuro: Alert and oriented. No new focal deficits. Ext: Warm, no deformities. Skin: No rashes, lesions or ulcers on visualized skin   Assessment & Plan: Principal Problem:   Sepsis (HCC) Active Problems:   Obstructive sleep apnea on CPAP   Obesity   Hyperlipidemia   Prostate cancer (HCC)   Hypogonadism male   BPH (benign prostatic hyperplasia)   Benign essential HTN   GERD (gastroesophageal reflux disease)   Systolic HF (heart failure) (HCC)  Sepsis due to E. coli bacteremia, CAUTI: - Continue ceftriaxone 2g IV q24h. Continues to have fevers though with no exam evidence of seeding.  - Urine culture pan sensitive, blood cultures (2 of 4 bottles) concordant organism with susceptibilities pending.   - Trend fever curve and WBC.   - Continue tylenol prn    Subacute urinary retention, BPH: Had failed TOV at urology clinic 5/1.  - Continue formulary equivalent while here, but resume rapaflo 8mg  daily - TOV to be repeated per urology after discharge.   CAD/dyslipidemia: No angina. 08/28/21 cardiac CTA with  Coronary calcium score of 70.6. This was 47th percentile for age-, sex, and race-matched controls. Mild atherosclerosis of the LAD and LCx. CAD RADS 2.  - Continue ASA, lovaza, rosuvastatin  HFrEF, HTN: LVEF 40-45% Aug 2023 official read, overread by Dr. Jacques Navy felt closer to 50%. Euvolemic.   - Continue metoprolol, hydralazine. Defer to cardiology as outpatient (has been on several medications/taken off for various reasons), pt opts to minimize medications. - NSR only on monitor thus far, will DC telemetry to facilitate mobility   GERD:  - PPI   Prostate cancer - Follow-up in 6 months with PSA (has been stable at 5.6) with Dr. Olga Coaster   Hypogonadism:  - Continue outpatient IM testosterone  Depression:  - Continue bupropion   Morbid obesity: Body mass index is 35.62 kg/m.   OSA:  - Continue CPAP  Hyponatremia:  - Monitor, consider work up if worsening  Tyrone Nine, MD [facility] [email or web address] 07/26/2022, 9:53 AM

## 2022-07-26 NOTE — Plan of Care (Signed)
Pt alert and oriented x 4. Afebrile this shift. Vitals stable. Foley in place. Foley care and CHG completed.  Problem: Education: Goal: Knowledge of General Education information will improve Description: Including pain rating scale, medication(s)/side effects and non-pharmacologic comfort measures Outcome: Progressing   Problem: Health Behavior/Discharge Planning: Goal: Ability to manage health-related needs will improve Outcome: Progressing   Problem: Clinical Measurements: Goal: Ability to maintain clinical measurements within normal limits will improve Outcome: Progressing Goal: Will remain free from infection Outcome: Progressing Goal: Diagnostic test results will improve Outcome: Progressing Goal: Respiratory complications will improve Outcome: Progressing Goal: Cardiovascular complication will be avoided Outcome: Progressing   Problem: Activity: Goal: Risk for activity intolerance will decrease Outcome: Progressing   Problem: Nutrition: Goal: Adequate nutrition will be maintained Outcome: Progressing   Problem: Coping: Goal: Level of anxiety will decrease Outcome: Progressing   Problem: Elimination: Goal: Will not experience complications related to bowel motility Outcome: Progressing Goal: Will not experience complications related to urinary retention Outcome: Progressing   Problem: Pain Managment: Goal: General experience of comfort will improve Outcome: Progressing   Problem: Safety: Goal: Ability to remain free from injury will improve Outcome: Progressing   Problem: Skin Integrity: Goal: Risk for impaired skin integrity will decrease Outcome: Progressing

## 2022-07-27 DIAGNOSIS — A419 Sepsis, unspecified organism: Secondary | ICD-10-CM | POA: Diagnosis not present

## 2022-07-27 LAB — BASIC METABOLIC PANEL WITH GFR
Anion gap: 7 (ref 5–15)
BUN: 19 mg/dL (ref 8–23)
CO2: 22 mmol/L (ref 22–32)
Calcium: 8.3 mg/dL — ABNORMAL LOW (ref 8.9–10.3)
Chloride: 103 mmol/L (ref 98–111)
Creatinine, Ser: 0.96 mg/dL (ref 0.61–1.24)
GFR, Estimated: >60 mL/min (ref >60)
Glucose, Bld: 97 mg/dL (ref 70–99)
Potassium: 3.9 mmol/L (ref 3.5–5.1)
Sodium: 132 mmol/L — ABNORMAL LOW (ref 135–145)

## 2022-07-27 LAB — CBC
HCT: 42.5 % (ref 39.0–52.0)
Hemoglobin: 13.7 g/dL (ref 13.0–17.0)
MCH: 26.1 pg (ref 26.0–34.0)
MCHC: 32.2 g/dL (ref 30.0–36.0)
MCV: 81 fL (ref 80.0–100.0)
Platelets: 314 10*3/uL (ref 150–400)
RBC: 5.25 MIL/uL (ref 4.22–5.81)
RDW: 15.9 % — ABNORMAL HIGH (ref 11.5–15.5)
WBC: 8.8 10*3/uL (ref 4.0–10.5)
nRBC: 0 % (ref 0.0–0.2)

## 2022-07-27 LAB — CULTURE, BLOOD (ROUTINE X 2)

## 2022-07-27 MED ORDER — AMOXICILLIN 500 MG PO TABS: 1000.0000 mg | ORAL_TABLET | Freq: Three times a day (TID) | ORAL | 0 refills | Status: AC

## 2022-07-27 NOTE — Discharge Summary (Signed)
Physician Discharge Summary   Patient: Stephen Walton MRN: [id number] DOB: 1955/06/19  Admit date:     07/23/2022  Discharge date: 07/27/22  Discharge Physician: Tyrone Nine   PCP: [facility]   Recommendations at discharge:  Complete a course of antibiotics for E. coli bacteremia with amoxicillin 1g TID. Follow up for voiding trial in the next week with Dr. Ronne Binning.  Discharge Diagnoses: Principal Problem:   Sepsis (HCC) Active Problems:   Obstructive sleep apnea on CPAP   Obesity   Hyperlipidemia   Prostate cancer (HCC)   Hypogonadism male   BPH (benign prostatic hyperplasia)   Benign essential HTN   GERD (gastroesophageal reflux disease)   Systolic HF (heart failure) [facility])  Hospital Course: Stephen Walton is a 67 y.o. male with a history of OSA on CPAP, obesity (BMI 35), BPH with recent urinary retention and foley catheterization who presented to the ED on 07/23/2022 with fever and chills with lower abdominal discomfort. He was febrile with leukocytosis (19.6k) and tachycardic. Blood and urine cultures collected, subsequently both growing E. coli for which ceftriaxone was continued. Urology evaluated the patient and will follow up within 1 week. With sustained clinical improvement, the patient is now stable for discharge on oral antibiotics.  Assessment and Plan: Sepsis due to E. coli bacteremia, CAUTI: - Continued ceftriaxone 2g IV q24h with resolution of fevers and leukocytosis, normalized vital signs. Discussed discharge antibiotic with ID, Dr. Drue Second, who recommends high dose amoxicillin (1g po TID) at discharge.    Subacute urinary retention, BPH: Had failed TOV at urology clinic 5/1.  - Resume rapaflo 8mg  daily - TOV to be repeated per urology after discharge. Dr. Ronne Binning evaluated the patient 5/9, recommending 1 week follow up.   CAD/dyslipidemia: No angina. 08/28/21 cardiac CTA with Coronary calcium score of 70.6. This was 47th percentile for  age-, sex, and race-matched controls. Mild atherosclerosis of the LAD and LCx. CAD RADS 2.  - Continue ASA, lovaza, rosuvastatin   HFrEF, HTN: LVEF 40-45% Aug 2023 official read, overread by Dr. Jacques Navy felt closer to 50%. Euvolemic.   - Continue metoprolol, hydralazine. Defer to cardiology as outpatient (has been on several medications/taken off for various reasons), pt opts to minimize medications. - NSR only on monitor thus far, will DC telemetry to facilitate mobility   GERD:  - PPI   Prostate cancer - Follow-up in 6 months with PSA (has been stable at 5.6) with Dr. Olga Coaster   Hypogonadism:  - Continue outpatient IM testosterone   Depression:  - Continue bupropion, buspar   Morbid obesity: Body mass index is 35.62 kg/m.    OSA:  - Continue CPAP   Hyponatremia:  - Monitor, consider work up if worsening  Consultants: Urology, Infectious Diseases Procedures performed: None  Disposition: Home Diet recommendation: Heart healthy DISCHARGE MEDICATION: Allergies as of 07/27/2022   No Known Allergies      Medication List     TAKE these medications    alfuzosin 10 MG 24 hr tablet Commonly known as: UROXATRAL Take 1 tablet (10 mg total) by mouth in the morning and at bedtime.   amoxicillin 500 MG tablet Commonly known as: AMOXIL Take 2 tablets (1,000 mg total) by mouth 3 (three) times daily for 7 days. Start taking on: Jul 28, 2022   aspirin 81 MG tablet Take 81 mg by mouth daily.   buPROPion 300 MG 24 hr tablet Commonly known as: WELLBUTRIN XL Take 300 mg by mouth daily.  busPIRone 15 MG tablet Commonly known as: BUSPAR Take 15 mg by mouth at bedtime.   dutasteride 0.5 MG capsule Commonly known as: AVODART Take 0.5 mg by mouth daily.   fish oil-omega-3 fatty acids 1000 MG capsule Take 1 g by mouth daily.   hydrALAZINE 25 MG tablet Commonly known as: APRESOLINE Take 1 tablet (25 mg total) by mouth 2 (two) times daily.   nitroGLYCERIN 0.4 MG  SL tablet Commonly known as: NITROSTAT Place 1 tablet (0.4 mg total) under the tongue every 5 (five) minutes as needed for chest pain.   NON FORMULARY at bedtime. CPAP   OZEMPIC (0.25 OR 0.5 MG/DOSE) Percival Inject 0.5 mg into the skin once a week.   rosuvastatin 20 MG tablet Commonly known as: CRESTOR Take 1 tablet (20 mg total) by mouth daily.   silodosin 8 MG Caps capsule Commonly known as: RAPAFLO Take 1 capsule (8 mg total) by mouth daily with breakfast.   testosterone cypionate 200 MG/ML injection Commonly known as: DEPOTESTOSTERONE CYPIONATE Inject 0.5 mLs (100 mg total) into the muscle every 7 (seven) days.        Follow-up Information     [facility] [employer]. Schedule an appointment as soon as possible for a visit.   Specialty: Family Medicine Contact information: [address] [place] [address] [phone]         Malen Gauze, MD Follow up.   Specialty: Urology Contact information: [address] [place] [address] [phone]                Discharge Exam: Ceasar Mons Weights   07/23/22 0732 07/23/22 1009  Weight: 113.4 kg 109.4 kg  BP 131/88 (BP Location: Right Arm)   Pulse 72   Temp 98.4 F (36.9 C) (Oral)   Resp 18   Ht 5\' 9"  (1.753 m)   Wt 109.4 kg   SpO2 97%   BMI 35.62 kg/m   Feels well, wants to go home. Appears in no distress.  Condition at discharge: stable  The results of significant diagnostics from this hospitalization (including imaging, microbiology, ancillary and laboratory) are listed below for reference.   Imaging Studies: No results found.  Microbiology: Results for orders placed or performed during the hospital encounter of 07/23/22  Urine Culture     Status: Abnormal   Collection Time: 07/23/22  8:02 AM   Specimen: Urine, Catheterized  Result Value Ref Range Status   Specimen Description   Final    URINE, CATHETERIZED Performed at [facility], [address],  [place], [place] [address]    Special Requests   Final    NONE Performed at [facility], [address] [place], [place] [address]    Culture 30,000 COLONIES/mL ESCHERICHIA COLI (A)  Final   Report Status 07/25/2022 FINAL  Final   Organism ID, Bacteria ESCHERICHIA COLI (A)  Final      Susceptibility   Escherichia coli - MIC*    AMPICILLIN 4 SENSITIVE Sensitive     CEFAZOLIN <=4 SENSITIVE Sensitive     CEFEPIME <=0.12 SENSITIVE Sensitive     CEFTRIAXONE <=0.25 SENSITIVE Sensitive     CIPROFLOXACIN <=0.25 SENSITIVE Sensitive     GENTAMICIN <=1 SENSITIVE Sensitive     IMIPENEM <=0.25 SENSITIVE Sensitive     NITROFURANTOIN <=16 SENSITIVE Sensitive     TRIMETH/SULFA <=20 SENSITIVE Sensitive     AMPICILLIN/SULBACTAM <=2 SENSITIVE Sensitive     PIP/TAZO <=4 SENSITIVE Sensitive     *  30,000 COLONIES/mL ESCHERICHIA COLI  Culture, blood (routine x 2)     Status: None (Preliminary result)   Collection Time: 07/23/22  9:19 AM   Specimen: BLOOD LEFT HAND  Result Value Ref Range Status   Specimen Description   Final    BLOOD LEFT HAND Performed at [facility], [address], [place], [place] [address]    Special Requests   Final    BOTTLES DRAWN AEROBIC AND ANAEROBIC Blood Culture adequate volume Performed at [facility], [address] [place], [place] [address]    Culture  Setup Time   Final    GRAM NEGATIVE RODS AEROBIC BOTTLE ONLY Gram Stain Report Called to,Read Back By and Verified With: DULEPP, L AT 2035 ON 07/26/22 BY SMN. Performed at [facility], [address] [place], [place] [address]    Culture   Final    Romie Minus NEGATIVE RODS IDENTIFICATION TO FOLLOW Performed at [facility], [address] [place], [place] [address]    Report Status PENDING  Incomplete  Culture, blood (routine x 2)     Status: Abnormal (Preliminary result)   Collection Time: 07/23/22  9:19 AM   Specimen: Right Antecubital; Blood  Result Value Ref Range Status   Specimen Description   Final     RIGHT ANTECUBITAL Performed at [facility], [address], [place], [place] [address]    Special Requests   Final    BOTTLES DRAWN AEROBIC AND ANAEROBIC Blood Culture adequate volume Performed at [facility], [address] [place], [place] [address]    Culture  Setup Time   Final    IN BOTH AEROBIC AND ANAEROBIC BOTTLES GRAM NEGATIVE RODS Gram Stain Report Called to,Read Back By and Verified With: CRYSTAL REYNOLDS @ 1820 ON 07/24/22 C VARNER CRITICAL RESULT CALLED TO, READ BACK BY AND VERIFIED WITH: C PITKINS,RN@0157  07/25/22 MK Performed at [facility], [address] [place], [place] [address]    Culture ESCHERICHIA COLI (A)  Final   Report Status PENDING  Incomplete   Organism ID, Bacteria ESCHERICHIA COLI  Final      Susceptibility   Escherichia coli - MIC*    AMPICILLIN 4 SENSITIVE Sensitive     CEFEPIME <=0.12 SENSITIVE Sensitive     CEFTAZIDIME <=1 SENSITIVE Sensitive     CEFTRIAXONE <=0.25 SENSITIVE Sensitive     CIPROFLOXACIN <=0.25 SENSITIVE Sensitive     GENTAMICIN <=1 SENSITIVE Sensitive     IMIPENEM <=0.25 SENSITIVE Sensitive     TRIMETH/SULFA <=20 SENSITIVE Sensitive     AMPICILLIN/SULBACTAM <=2 SENSITIVE Sensitive     PIP/TAZO <=4 SENSITIVE Sensitive     * ESCHERICHIA COLI  Blood Culture ID Panel (Reflexed)     Status: Abnormal   Collection Time: 07/23/22  9:19 AM  Result Value Ref Range Status   Enterococcus faecalis NOT DETECTED NOT DETECTED Final   Enterococcus Faecium NOT DETECTED NOT DETECTED Final   Listeria monocytogenes NOT DETECTED NOT DETECTED Final   Staphylococcus species NOT DETECTED NOT DETECTED Final   Staphylococcus aureus (BCID) NOT DETECTED NOT DETECTED Final   Staphylococcus epidermidis NOT DETECTED NOT DETECTED Final   Staphylococcus lugdunensis NOT DETECTED NOT DETECTED Final   Streptococcus species NOT DETECTED NOT DETECTED Final   Streptococcus agalactiae NOT DETECTED NOT DETECTED Final   Streptococcus pneumoniae NOT DETECTED NOT  DETECTED Final   Streptococcus pyogenes NOT DETECTED NOT DETECTED Final   A.calcoaceticus-baumannii NOT DETECTED NOT DETECTED Final   Bacteroides fragilis NOT DETECTED NOT DETECTED Final  Enterobacterales DETECTED (A) NOT DETECTED Final    Comment: Enterobacterales represent a large order of gram negative bacteria, not a single organism. CRITICAL RESULT CALLED TO, READ BACK BY AND VERIFIED WITH: C PIPKINS,RN@0155  07/25/22 MK    Enterobacter cloacae complex NOT DETECTED NOT DETECTED Final   Escherichia coli DETECTED (A) NOT DETECTED Final    Comment: CRITICAL RESULT CALLED TO, READ BACK BY AND VERIFIED WITH: C PIPKINS,RN@0155  07/25/22 MK    Klebsiella aerogenes NOT DETECTED NOT DETECTED Final   Klebsiella oxytoca NOT DETECTED NOT DETECTED Final   Klebsiella pneumoniae NOT DETECTED NOT DETECTED Final   Proteus species NOT DETECTED NOT DETECTED Final   Salmonella species NOT DETECTED NOT DETECTED Final   Serratia marcescens NOT DETECTED NOT DETECTED Final   Haemophilus influenzae NOT DETECTED NOT DETECTED Final   Neisseria meningitidis NOT DETECTED NOT DETECTED Final   Pseudomonas aeruginosa NOT DETECTED NOT DETECTED Final   Stenotrophomonas maltophilia NOT DETECTED NOT DETECTED Final   Candida albicans NOT DETECTED NOT DETECTED Final   Candida auris NOT DETECTED NOT DETECTED Final   Candida glabrata NOT DETECTED NOT DETECTED Final   Candida krusei NOT DETECTED NOT DETECTED Final   Candida parapsilosis NOT DETECTED NOT DETECTED Final   Candida tropicalis NOT DETECTED NOT DETECTED Final   Cryptococcus neoformans/gattii NOT DETECTED NOT DETECTED Final   CTX-M ESBL NOT DETECTED NOT DETECTED Final   Carbapenem resistance IMP NOT DETECTED NOT DETECTED Final   Carbapenem resistance KPC NOT DETECTED NOT DETECTED Final   Carbapenem resistance NDM NOT DETECTED NOT DETECTED Final   Carbapenem resist OXA 48 LIKE NOT DETECTED NOT DETECTED Final   Carbapenem resistance VIM NOT DETECTED NOT  DETECTED Final    Comment: Performed at [facility], [address] [place], [place] [address]    Labs: CBC: Recent Labs  Lab 07/23/22 0751 07/24/22 0502 07/25/22 0408 07/27/22 0552  WBC 19.6* 22.7* 12.9* 8.8  NEUTROABS 17.1*  --   --   --   HGB 15.3 14.9 13.6 13.7  HCT 48.6 46.9 42.8 42.5  MCV 82.0 82.4 81.2 81.0  PLT 328 287 268 314   Basic Metabolic Panel: Recent Labs  Lab 07/23/22 0751 07/24/22 0502 07/25/22 0408 07/27/22 0552  NA 132* 130* 130* 132*  K 4.4 4.3 4.0 3.9  CL 100 99 101 103  CO2 23 25 23 22   GLUCOSE 111* 112* 94 97  BUN 16 22 19 19   CREATININE 1.11 1.30* 1.06 0.96  CALCIUM 8.9 8.5* 8.5* 8.3*  MG  --  1.9  --   --    Liver Function Tests: No results for input(s): "AST", "ALT", "ALKPHOS", "BILITOT", "PROT", "ALBUMIN" in the last 168 hours. CBG: No results for input(s): "GLUCAP" in the last 168 hours.  Discharge time spent: greater than 30 minutes.  Signed: Tyrone Nine, MD [facility] 07/27/2022

## 2022-07-28 LAB — CULTURE, BLOOD (ROUTINE X 2)
Special Requests: ADEQUATE
Special Requests: ADEQUATE

## 2022-07-30 NOTE — Consult Note (Signed)
[facility] Customer service manager [facility]) Accountable Care Organization (ACO) [facility] Liaison Note  07/30/2022  Stephen Walton 1955-08-12 [id number]  Location: [facility] RN Hospital Liaison screened the patient remotely at [facility].  Insurance: MCR ACO   Stephen Walton is a 67 y.o. 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 The patient was screened for  readmission hospitalization with noted low risk score for unplanned readmission risk with 2 IP in 6 months.  The patient was assessed for potential [facility]) Care Management service needs for post hospital transition for care coordination. Review of patient's electronic medical record reveals patient was admitted with severe sepsis.   Plan: [facility] Liaison will continue to follow progress and disposition to asess for post hospital community care coordination/management needs.  Referral request for community care coordination: anticipate [facility] Transitions of Care Team follow up.   [facility] Care Management/Population Health does not replace or interfere with any arrangements made by the Inpatient Transition of Care team.   For questions contact:   Elliot Cousin, RN, BSN [facility] Liaison [place]   [facility]  Population Health Office Hours MTWF 8:00 am to 6 pm off on Thursday [phone] mobile [phone] [Office toll free line][facility] Office Hours are M-F 8:30 - 5 pm 24 hour nurse advise line [phone] Conceirge  [email or web address]

## 2022-08-06 ENCOUNTER — Other Ambulatory Visit: Payer: Self-pay | Admitting: Cardiovascular Disease

## 2022-08-07 ENCOUNTER — Encounter: Payer: Self-pay | Admitting: Urology

## 2022-08-07 ENCOUNTER — Ambulatory Visit (INDEPENDENT_AMBULATORY_CARE_PROVIDER_SITE_OTHER): Payer: Medicare Other | Admitting: Urology

## 2022-08-07 VITALS — BP 122/84 | HR 80

## 2022-08-07 DIAGNOSIS — N401 Enlarged prostate with lower urinary tract symptoms: Secondary | ICD-10-CM

## 2022-08-07 DIAGNOSIS — A419 Sepsis, unspecified organism: Secondary | ICD-10-CM

## 2022-08-07 DIAGNOSIS — R339 Retention of urine, unspecified: Secondary | ICD-10-CM | POA: Diagnosis not present

## 2022-08-07 DIAGNOSIS — N138 Other obstructive and reflux uropathy: Secondary | ICD-10-CM

## 2022-08-07 LAB — BLADDER SCAN AMB NON-IMAGING

## 2022-08-07 MED ORDER — SILODOSIN 8 MG PO CAPS: 8.0000 mg | ORAL_CAPSULE | Freq: Two times a day (BID) | ORAL | 11 refills | Status: DC

## 2022-08-07 MED ORDER — CIPROFLOXACIN HCL 500 MG PO TABS
500.0000 mg | ORAL_TABLET | Freq: Once | ORAL | Status: DC
Start: 2022-08-07 — End: 2022-08-15

## 2022-08-07 NOTE — Progress Notes (Unsigned)
Simple Catheter Placement  Due to urinary retention patient is present today for a foley cath placement.  Patient was cleaned and prepped in a sterile fashion with betadine and 2% lidocaine jelly was instilled into the urethra. A 16  FR foley catheter was inserted, urine return was noted  600 ml, urine was yellow in color.  The balloon was filled with 10cc of sterile water.  A over night bag was attached for drainage.  Patient was given instruction on proper catheter care.  Patient tolerated well, no complications were noted   Performed by: Maryland Pink RMA  Additional notes/ Follow up: F/UP as scheduled visit

## 2022-08-07 NOTE — Patient Instructions (Signed)

## 2022-08-07 NOTE — Progress Notes (Unsigned)
Fill and Pull Catheter Removal  Patient is present today for a catheter removal.  Patient was cleaned and prepped in a sterile fashion 100 ml of sterile water/ saline was instilled into the bladder when the patient felt the urge to urinate. 8 ml of water was then drained from the balloon.  A 16 FR foley cath was removed from the bladder no complications were noted .  Patient as then given some time to void on their own.  Patient can void  50 ml on their own after some time.  Patient tolerated well.  Performed by: Maryland Pink RMA  Follow up/ Additional notes: f/u as scheduled visit

## 2022-08-07 NOTE — Progress Notes (Unsigned)
08/07/2022 9:33 AM   [name] 09-20-1955 [id number]  Referring provider: [facility] [address] [place],  [place] [address]  Chief Complaint  Patient presents with   Follow-up    HPI: Voiding trial failed today   PMH: Past Medical History:  Diagnosis Date   CAD (coronary artery disease) 10/2021   moderate disease in left circumflex artery,   Chest pain, atypical    Erectile dysfunction    2024   H/O echocardiogram 10/04/2011   EF >55%;mild concentric LVH; normals systolic & diastolic dysfunction; mild aortic stenosis   Heart failure with mildly reduced ejection fraction (HFmrEF) (HCC) 2023   History of nuclear stress test 10/04/2011   low risk; small amount of basal inferior bowel artifact   Hyperlipidemia    Hypertension    OSA on CPAP    OSA on CPAP     Surgical History: Past Surgical History:  Procedure Laterality Date   COLONOSCOPY N/A 11/14/2020   Procedure: COLONOSCOPY;  Surgeon: Franky Macho, MD;  Location: [facility];  Service: Gastroenterology;  Laterality: N/A;    Home Medications:  Allergies as of 08/07/2022   No Known Allergies      Medication List        Accurate as of Aug 07, 2022  9:33 AM. If you have any questions, ask your nurse or doctor.          alfuzosin 10 MG 24 hr tablet Commonly known as: UROXATRAL Take 1 tablet (10 mg total) by mouth in the morning and at bedtime.   aspirin 81 MG tablet Take 81 mg by mouth daily.   buPROPion 300 MG 24 hr tablet Commonly known as: WELLBUTRIN XL Take 300 mg by mouth daily.   busPIRone 15 MG tablet Commonly known as: BUSPAR Take 15 mg by mouth at bedtime.   dutasteride 0.5 MG capsule Commonly known as: AVODART Take 0.5 mg by mouth daily.   fish oil-omega-3 fatty acids 1000 MG capsule Take 1 g by mouth daily.   hydrALAZINE 25 MG tablet Commonly known as: APRESOLINE Take 1 tablet (25 mg total) by mouth 2 (two) times daily.    nitroGLYCERIN 0.4 MG SL tablet Commonly known as: NITROSTAT Place 1 tablet (0.4 mg total) under the tongue every 5 (five) minutes as needed for chest pain.   NON FORMULARY at bedtime. CPAP   OZEMPIC (0.25 OR 0.5 MG/DOSE) [place] Inject 0.5 mg into the skin once a week.   rosuvastatin 20 MG tablet Commonly known as: CRESTOR Take 1 tablet (20 mg total) by mouth daily.   silodosin 8 MG Caps capsule Commonly known as: RAPAFLO Take 1 capsule (8 mg total) by mouth daily with breakfast.   testosterone cypionate 200 MG/ML injection Commonly known as: DEPOTESTOSTERONE CYPIONATE Inject 0.5 mLs (100 mg total) into the muscle every 7 (seven) days.        Allergies: No Known Allergies  Family History: Family History  Problem Relation Age of Onset   Heart attack Brother     Social History:  reports that he has quit smoking. He has never used smokeless tobacco. He reports current alcohol use. He reports that he does not use drugs.  ROS: All other review of systems were reviewed and are negative except what is noted above in HPI  Physical Exam: BP 122/84   Pulse 80   Constitutional:  Alert and oriented, No acute distress. HEENT: [place] AT, moist mucus membranes.  Trachea midline, no masses.  Cardiovascular: No clubbing, cyanosis, or edema. Respiratory: Normal respiratory effort, no increased work of breathing. GI: Abdomen is soft, nontender, nondistended, no abdominal masses GU: No CVA tenderness.  Lymph: No cervical or inguinal lymphadenopathy. Skin: No rashes, bruises or suspicious lesions. Neurologic: Grossly intact, no focal deficits, moving all 4 extremities. Psychiatric: Normal mood and affect.  Laboratory Data: Lab Results  Component Value Date   WBC 8.8 07/27/2022   HGB 13.7 07/27/2022   HCT 42.5 07/27/2022   MCV 81.0 07/27/2022   PLT 314 07/27/2022    Lab Results  Component Value Date   CREATININE 0.96 07/27/2022    Lab Results  Component Value Date   PSA 4.0  10/21/2019   PSA 3.9 03/22/2019    Lab Results  Component Value Date   TESTOSTERONE 528 07/04/2022    No results found for: "HGBA1C"  Urinalysis    Component Value Date/Time   COLORURINE AMBER (A) 07/23/2022 0802   APPEARANCEUR CLEAR 07/23/2022 0802   APPEARANCEUR Clear 05/30/2022 1403   LABSPEC 1.016 07/23/2022 0802   PHURINE 5.0 07/23/2022 0802   GLUCOSEU NEGATIVE 07/23/2022 0802   HGBUR MODERATE (A) 07/23/2022 0802   BILIRUBINUR NEGATIVE 07/23/2022 0802   BILIRUBINUR Negative 05/30/2022 1403   KETONESUR NEGATIVE 07/23/2022 0802   PROTEINUR 100 (A) 07/23/2022 0802   UROBILINOGEN 1.0 10/29/2021 1542   NITRITE POSITIVE (A) 07/23/2022 0802   LEUKOCYTESUR MODERATE (A) 07/23/2022 0802    Lab Results  Component Value Date   LABMICR See below: 05/30/2022   WBCUA 0-5 05/30/2022   LABEPIT 0-10 05/30/2022   MUCUS Present 07/11/2021   BACTERIA RARE (A) 07/23/2022    Pertinent Imaging: *** No results found for this or any previous visit.  No results found for this or any previous visit.  No results found for this or any previous visit.  No results found for this or any previous visit.  Results for orders placed during the hospital encounter of 04/24/15  US Renal  Narrative CLINICAL DATA:  Proteinuria  EXAM: RENAL / URINARY TRACT ULTRASOUND COMPLETE  COMPARISON:  None.  FINDINGS: Right Kidney:  Length: 12.7 cm. Echogenicity within normal limits. No mass or hydronephrosis visualized.  Left Kidney:  Length: 12.0 cm. Echogenicity within normal limits. No mass or hydronephrosis visualized.  Bladder:  Appears normal for degree of bladder distention.  IMPRESSION: Normal renal ultrasound.   Electronically Signed By: Marlan Palau M.D. On: 04/24/2015 09:56  No valid procedures specified. No results found for this or any previous visit.  No results found for this or any previous visit.   Assessment & Plan:    1. Sepsis without acute organ  dysfunction, due to unspecified organism (HCC) *** - Bladder Voiding Trial - ciprofloxacin (CIPRO) tablet 500 mg  2. Benign prostatic hyperplasia with urinary obstruction ***  3. Urinary retention ***   No follow-ups on file.  Wilkie Aye, MD  [facility] [place]

## 2022-08-08 ENCOUNTER — Encounter: Payer: Self-pay | Admitting: Urology

## 2022-08-14 ENCOUNTER — Ambulatory Visit (INDEPENDENT_AMBULATORY_CARE_PROVIDER_SITE_OTHER): Payer: Medicare Other | Admitting: Urology

## 2022-08-14 VITALS — BP 130/74 | HR 80

## 2022-08-14 DIAGNOSIS — N401 Enlarged prostate with lower urinary tract symptoms: Secondary | ICD-10-CM | POA: Diagnosis not present

## 2022-08-14 DIAGNOSIS — R339 Retention of urine, unspecified: Secondary | ICD-10-CM | POA: Diagnosis not present

## 2022-08-14 MED ORDER — CIPROFLOXACIN HCL 500 MG PO TABS
500.0000 mg | ORAL_TABLET | Freq: Once | ORAL | Status: AC
Start: 2022-08-14 — End: 2022-08-14
  Administered 2022-08-14: 500 mg via ORAL

## 2022-08-14 NOTE — Progress Notes (Unsigned)
Catheter Removal  Patient is present today for a catheter removal.  10ml of water was drained from the balloon. A 16FR foley cath was removed from the bladder, no complications were noted. Patient tolerated well.  Performed by: Hudsen Fei LPN  Follow up/ Additional notes:Per MD note   post void residual=204

## 2022-08-14 NOTE — Progress Notes (Unsigned)
   08/14/22  CC: urinary retention   HPI: Mr Eary is a 67yo here for followup for urinary retnetion Blood pressure 130/74, pulse 80. NED. A&Ox3.   No respiratory distress   Abd soft, NT, ND Normal phallus with bilateral descended testicles  Cystoscopy Procedure Note  Patient identification was confirmed, informed consent was obtained, and patient was prepped using Betadine solution.  Lidocaine jelly was administered per urethral meatus.     Pre-Procedure: - Inspection reveals a normal caliber ureteral meatus.  Procedure: The flexible cystoscope was introduced without difficulty - No urethral strictures/lesions are present. - Enlarged prostate no median lobe - Normal bladder neck - Bilateral ureteral orifices identified - Bladder mucosa  reveals no ulcers, tumors, or lesions - No bladder stones - No trabeculation     Post-Procedure: - Patient tolerated the procedure well  Assessment/ Plan: We discussed the management of his BPH including continued medical therapy, Rezum, Urolift, TURP and simple prostatectomy. After discussing the options the patient has elected to proceed with Urolift. Risks/benefits/alternatives discussed.   No follow-ups on file.  Wilkie Aye, MD

## 2022-08-15 ENCOUNTER — Emergency Department (HOSPITAL_COMMUNITY)
Admission: EM | Admit: 2022-08-15 | Discharge: 2022-08-15 | Disposition: A | Discharge status: Home / Self Care | Payer: Medicare Other | Attending: Emergency Medicine | Admitting: Emergency Medicine

## 2022-08-15 ENCOUNTER — Encounter: Payer: Self-pay | Admitting: Urology

## 2022-08-15 ENCOUNTER — Other Ambulatory Visit: Payer: Self-pay

## 2022-08-15 ENCOUNTER — Telehealth: Payer: Self-pay

## 2022-08-15 DIAGNOSIS — Z7982 Long term (current) use of aspirin: Secondary | ICD-10-CM | POA: Diagnosis not present

## 2022-08-15 DIAGNOSIS — R339 Retention of urine, unspecified: Secondary | ICD-10-CM | POA: Insufficient documentation

## 2022-08-15 LAB — URINALYSIS, W/ REFLEX TO CULTURE (INFECTION SUSPECTED)
Bacteria, UA: NONE SEEN
Bilirubin Urine: NEGATIVE
Glucose, UA: NEGATIVE mg/dL
Hgb urine dipstick: NEGATIVE
Ketones, ur: NEGATIVE mg/dL
Leukocytes,Ua: NEGATIVE
Nitrite: NEGATIVE
Protein, ur: NEGATIVE mg/dL
Specific Gravity, Urine: 1.015 (ref 1.005–1.030)
pH: 5 (ref 5.0–8.0)

## 2022-08-15 MED ORDER — CIPROFLOXACIN HCL 500 MG PO TABS: 500.0000 mg | ORAL_TABLET | Freq: Two times a day (BID) | ORAL | 0 refills | Status: DC

## 2022-08-15 NOTE — Telephone Encounter (Signed)
Returned call to patient. Patient made aware that MD is in the OR today but will return to office tomorrow. Once MD has reviewed patients current status someone will call patient back with MD recommendations. Patient voiced understanding.

## 2022-08-15 NOTE — Telephone Encounter (Signed)
Patient had to return to the ED due to urinary retention. A catheter was placed back.  Wanting to speak with a nurse regarding medications and what he will need to do with the catheter.   Please advise.

## 2022-08-15 NOTE — Patient Instructions (Signed)

## 2022-08-15 NOTE — Progress Notes (Signed)
Cardiology Clinic Note   Date: 08/15/2022 ID: Phoenyx, Gallardo April 28, 1955, MRN [id number]  Primary Cardiologist:  Nicki Guadalajara, MD  Patient Profile    Stephen Walton is a 67 y.o. male who presents to the clinic today for follow up after medication changes.   Past medical history significant for: Nonobstructive CAD. Coronary CTA 08/28/2021: Calcium score 70.6 (47th percentile).  Mild atherosclerosis of the LAD and LCx. Chronic systolic heart failure. Echo 09/04/2021: EF 40 to 45%.  Mild LVH.  Global strain is abnormal normal RV function.  Trivial MR. Per Dr. Jacques Navy EF likely closer to 50 to 55%. Palpitations. 7-day ZIO 06/20/2022: Preliminary report-min HR 47 bpm, max HR 182 bpm, average HR 75 bpm.  Predominant underlying rhythm was sinus rhythm.  First-degree AV block was present.  1 run of SVT lasting 4 beats max rate 182 bpm.  Rare ectopy. Hypertension. Hyperlipidemia. Lipid panel 02/08/2022: LDL 53, HDL 53, TG 61, total 118. Erectile dysfunction.  GERD. OSA. Prostate cancer.   History of Present Illness    Stephen Walton is a longtime patient of cardiology.  He is followed by Dr. Tresa Endo for the above outlined history.  Patient was seen in the office by Wallis Bamberg, NP on 05/28/2022 with complaints of chest pain with mixed features but typically not occurring with exertion.  He was started on Ranexa.  He will requested stopping beta-blocker secondary to erectile dysfunction.  He also complained of palpitations and wore a 7-day ZIO with pulmonary report of 1 run of SVT and rare ectopy.  Patient was last seen in the office by Perlie Gold, PA-C on 07/05/2022 for follow-up.  Patient reported he did not start Ranexa as he tried a PPI first with notable improvement to his symptoms.  He was to start valsartan for his blood pressure but was concerned that it too would cause ED symptoms.  He was started on hydralazine.  Today, patient reports he was hospitalized for urosepsis  from 07/23/2022 to 07/27/2022. His heart rate was elevated so he was given Metoprolol. Since discharge from hospital he has been taking both metoprolol and hydralazine with well controlled heart rate and BP. He is concerned about developing ED symptoms again and would like to transition to a different medication that will help control his heart rate. He is otherwise doing well. He unfortunately had to get a Foley catheter placed for urinary retention but he is managing that well. Patient denies shortness of breath or dyspnea on exertion. No chest pain, pressure, or tightness. Denies lower extremity edema, orthopnea, or PND. No palpitations.     ROS: All other systems reviewed and are otherwise negative except as noted in History of Present Illness.  Studies Reviewed    ECG is not ordered today.       Physical Exam    VS:  There were no vitals taken for this visit. , BMI There is no height or weight on file to calculate BMI.  GEN: Well nourished, well developed, in no acute distress. Neck: No JVD or carotid bruits. Cardiac:  RRR. No murmurs. No rubs or gallops.   Respiratory:  Respirations regular and unlabored. Clear to auscultation without rales, wheezing or rhonchi. GI: Soft, nontender, nondistended. Extremities: Radials/DP/PT 2+ and equal bilaterally. No clubbing or cyanosis. No edema.  Skin: Warm and dry, no rash. Neuro: Strength intact.  Assessment & Plan    Nonobstructive CAD.  Coronary CTA June 2023 showed calcium score 70.6 with mild atherosclerosis of the  LAD and LCx.  Patient denies chest pain, tightness pressure since starting PPI. Continue aspirin, rosuvastatin, as needed SL NTG.  Patient is beta-blocker intolerant. Chronic systolic heart failure.  Echo June 2023 showed EF 40 to 45% (per Dr. Jacques Navy felt to be closer to 50 to 55%), mild LVH.  Patient denies lower extremity edema, DOE, orthopnea or PND. Euvolemic and well compensated on exam.  Continue hydralazine. Palpitations.   7-day ZIO preliminary report showed 1 brief run of SVT.  Patient denies palpitations. Starting diltiazem (see #4). Hypertension. BP today 122/68. Patient denies headaches, dizziness or vision changes. Continue hydralazine. He has been taking Metoprolol since 07/27/2022 but would like to come off it and start something else that will help reduce his heart rate. Will stop metoprolol and start Diltiazem 120 mg daily. Patient will continue to monitor BP and heart rate.  Hyperlipidemia.  LDL November 2023 53, at goal.  Continue rosuvastatin.  Disposition: Stop Metoprolol. Start diltiazem 120 mg daily. Return in 3 months or sooner as needed.          Signed, Etta Grandchild. Bethanie Bloxom, DNP, NP-C

## 2022-08-15 NOTE — ED Provider Notes (Signed)
[facility] EMERGENCY DEPARTMENT AT [facility] Provider Note   CSN: [id number] Arrival date & time: 08/15/22  0507     History  Chief Complaint  Patient presents with   Urinary Retention    Stephen Walton is a 67 y.o. male.  Patient presents to the emergency department for evaluation of urinary retention.  He reports that he has a history of an enlarged prostate and has required Foley catheters in the past.  Patient was just seen by his urologist and had a catheter removed that was placed for urinary retention.  He has not been able to pass any urine since this evening.  Patient experiencing increasing pain in the area of the bladder similar to prior outlet obstruction pain.       Home Medications Prior to Admission medications   Medication Sig Start Date End Date Taking? Authorizing Provider  ciprofloxacin (CIPRO) 500 MG tablet Take 1 tablet (500 mg total) by mouth 2 (two) times daily. One po bid x 7 days 08/15/22  Yes Ashtan Girtman, Canary Brim, MD  alfuzosin (UROXATRAL) 10 MG 24 hr tablet Take 1 tablet (10 mg total) by mouth in the morning and at bedtime. 12/26/21   McKenzie, [name], MD  aspirin 81 MG tablet Take 81 mg by mouth daily.    [provider]  buPROPion (WELLBUTRIN XL) 300 MG 24 hr tablet Take 300 mg by mouth daily. 08/18/18   [provider]  busPIRone (BUSPAR) 15 MG tablet Take 15 mg by mouth at bedtime.    [provider]  dutasteride (AVODART) 0.5 MG capsule Take 0.5 mg by mouth daily. Patient not taking: Reported on 08/14/2022    [provider]  fish oil-omega-3 fatty acids 1000 MG capsule Take 1 g by mouth daily.    [provider]  hydrALAZINE (APRESOLINE) 25 MG tablet Take 1 tablet (25 mg total) by mouth 2 (two) times daily. 07/05/22   Perlie Gold, PA-C  nitroGLYCERIN (NITROSTAT) 0.4 MG SL tablet Place 1 tablet (0.4 mg total) under the tongue every 5 (five) minutes as needed for chest pain. 08/23/21   Azalee Course, PA  NON FORMULARY at bedtime. CPAP    [provider]  rosuvastatin (CRESTOR) 20 MG tablet Take 1 tablet (20 mg total) by mouth daily. 08/23/21   Lennette Bihari, MD  Semaglutide (OZEMPIC, 0.25 OR 0.5 MG/DOSE, [place]) Inject 0.5 mg into the skin once a week.    [provider]  silodosin (RAPAFLO) 8 MG CAPS capsule Take 1 capsule (8 mg total) by mouth 2 (two) times daily. 08/07/22   McKenzie, [name], MD  testosterone cypionate (DEPOTESTOSTERONE CYPIONATE) 200 MG/ML injection Inject 0.5 mLs (100 mg total) into the muscle every 7 (seven) days. 12/26/21   McKenzie, [name], MD      Allergies    Patient has no known allergies.    Review of Systems   Review of Systems  Physical Exam Updated Vital Signs BP (!) 193/107   Pulse 81   Temp 98.2 F (36.8 C)   Resp 19   Ht 5\' 9"  (1.753 m)   Wt 110 kg   SpO2 99%   BMI 35.81 kg/m  Physical Exam Vitals and nursing note reviewed.  Constitutional:      General: He is in acute distress.     Appearance: He is well-developed.  HENT:     Head: Normocephalic and atraumatic.     Mouth/Throat:     Mouth: Mucous  membranes are moist.  Eyes:     General: Vision grossly intact. Gaze aligned appropriately.     Extraocular Movements: Extraocular movements intact.     Conjunctiva/sclera: Conjunctivae normal.  Cardiovascular:     Rate and Rhythm: Normal rate and regular rhythm.     Pulses: Normal pulses.     Heart sounds: Normal heart sounds, S1 normal and S2 normal. No murmur heard.    No friction rub. No gallop.  Pulmonary:     Effort: Pulmonary effort is normal. No respiratory distress.     Breath sounds: Normal breath sounds.  Abdominal:     Palpations: Abdomen is soft.     Tenderness: There is abdominal tenderness in the suprapubic area. There is no guarding or rebound.     Hernia: No hernia is present.  Musculoskeletal:        General: No swelling.     Cervical back: Full passive range of motion without pain, normal  range of motion and neck supple. No pain with movement, spinous process tenderness or muscular tenderness. Normal range of motion.     Right lower leg: No edema.     Left lower leg: No edema.  Skin:    General: Skin is warm and dry.     Capillary Refill: Capillary refill takes less than 2 seconds.     Findings: No ecchymosis, erythema, lesion or wound.  Neurological:     Mental Status: He is alert and oriented to person, place, and time.     GCS: GCS eye subscore is 4. GCS verbal subscore is 5. GCS motor subscore is 6.     Cranial Nerves: Cranial nerves 2-12 are intact.     Sensory: Sensation is intact.     Motor: Motor function is intact. No weakness or abnormal muscle tone.     Coordination: Coordination is intact.  Psychiatric:        Mood and Affect: Mood normal.        Speech: Speech normal.        Behavior: Behavior normal.     ED Results / Procedures / Treatments   Labs (all labs ordered are listed, but only abnormal results are displayed) Labs Reviewed  URINALYSIS, W/ REFLEX TO CULTURE (INFECTION SUSPECTED)    EKG None  Radiology No results found.  Procedures Procedures    Medications Ordered in ED Medications - No data to display  ED Course/ Medical Decision Making/ A&P                             Medical Decision Making  Presents with acute urinary retention secondary to bladder outlet obstruction from prostate enlargement.  Patient just had a Foley catheter removed and now is experiencing retention once again.  Foley catheter to be placed.  Patient reports a history of urosepsis, reports that his urologist usually gets an antibiotic, Cipro, when the catheter is in place.  Will discharge with a 1 week course of Cipro, urine culture pending.        Final Clinical Impression(s) / ED Diagnoses Final diagnoses:  Urinary retention    Rx / DC Orders ED Discharge Orders          Ordered    ciprofloxacin (CIPRO) 500 MG tablet  2 times daily         08/15/22 0528              Gilda Crease, MD 08/15/22 [phone]

## 2022-08-15 NOTE — ED Triage Notes (Addendum)
Pt was seen at PCP today to have catheter removed reports he has not been able to urinate since 2200 last night.  Hx of enlarged prostate.   Bladder scan showed 469 mL

## 2022-08-16 ENCOUNTER — Telehealth: Payer: Self-pay

## 2022-08-16 ENCOUNTER — Ambulatory Visit: Payer: Medicare Other | Attending: Student | Admitting: Student

## 2022-08-16 ENCOUNTER — Encounter: Payer: Self-pay | Admitting: Student

## 2022-08-16 VITALS — BP 122/68 | HR 73 | Ht 69.0 in | Wt 249.2 lb

## 2022-08-16 DIAGNOSIS — I2510 Atherosclerotic heart disease of native coronary artery without angina pectoris: Secondary | ICD-10-CM | POA: Insufficient documentation

## 2022-08-16 DIAGNOSIS — R002 Palpitations: Secondary | ICD-10-CM | POA: Diagnosis present

## 2022-08-16 DIAGNOSIS — I5022 Chronic systolic (congestive) heart failure: Secondary | ICD-10-CM | POA: Diagnosis present

## 2022-08-16 DIAGNOSIS — I10 Essential (primary) hypertension: Secondary | ICD-10-CM | POA: Diagnosis present

## 2022-08-16 DIAGNOSIS — E785 Hyperlipidemia, unspecified: Secondary | ICD-10-CM | POA: Insufficient documentation

## 2022-08-16 MED ORDER — DILTIAZEM HCL ER COATED BEADS 120 MG PO CP24: 120.0000 mg | ORAL_CAPSULE | Freq: Every day | ORAL | 3 refills | Status: DC

## 2022-08-16 NOTE — Patient Instructions (Signed)
Medication Instructions:   STOP METOPROLOL TARTRATE   START: DILTIAZEM 120mg  ONCE DAILY   *If you need a refill on your cardiac medications before your next appointment, please call your pharmacy*  Lab Work: None Ordered At This Time.  If you have labs (blood work) drawn today and your tests are completely normal, you will receive your results only by: MyChart Message (if you have MyChart) OR A paper copy in the mail If you have any lab test that is abnormal or we need to change your treatment, we will call you to review the results.  Testing/Procedures: None Ordered At This Time.   Follow-Up: At [facility], you and your health needs are our priority.  As part of our continuing mission to provide you with exceptional heart care, we have created designated Provider Care Teams.  These Care Teams include your primary Cardiologist (physician) and Advanced Practice Providers (APPs -  Physician Assistants and Nurse Practitioners) who all work together to provide you with the care you need, when you need it.  Your next appointment:   3 month(s)  Provider:   Nicki Guadalajara, MD   OR APP

## 2022-08-16 NOTE — Telephone Encounter (Signed)
Return call to patient for question about removing catheter or seeing Dr. Ronne Binning sooner. Per Dr. Ronne Binning patient will keep catheter until his follow up appointment. Patient voiced understanding.

## 2022-08-19 ENCOUNTER — Telehealth: Payer: Self-pay

## 2022-08-19 NOTE — Telephone Encounter (Signed)
-----   Message from Flossie Dibble, NP sent at 08/18/2022 11:35 AM EDT ----- Mr. Lozo,  Results of your monitor are overall reassuring. I see you were recently evaluated by Gavin Pound and have a plan to start Diltiazem.  Predominant rhythm was sinus--which is normal, 1 run of SVT, which is not dangerous but you would likely be able to feel that.  Stable results. Best, Victorino Dike

## 2022-08-19 NOTE — Telephone Encounter (Signed)
Patient aware of monitor results. Verbalized understanding

## 2022-08-21 ENCOUNTER — Encounter: Payer: Self-pay | Admitting: *Deleted

## 2022-08-21 ENCOUNTER — Telehealth: Payer: Self-pay | Admitting: *Deleted

## 2022-08-21 ENCOUNTER — Ambulatory Visit: Payer: Self-pay | Admitting: *Deleted

## 2022-08-21 NOTE — Progress Notes (Signed)
  Care Coordination   Note   08/21/2022 Name: DELMOS ZAHORIK MRN: [id number] DOB: Jul 10, 1955  Stephen Walton is a 67 y.o. year old male who sees Avis Epley, [place] for primary care. I reached out to [name] by phone today to offer care coordination services.  Mr. Eicher was given information about Care Coordination services today including:   The Care Coordination services include support from the care team which includes your Nurse Coordinator, Clinical Social Worker, or Pharmacist.  The Care Coordination team is here to help remove barriers to the health concerns and goals most important to you. Care Coordination services are voluntary, and the patient may decline or stop services at any time by request to their care team member.   Care Coordination Consent Status: Patient agreed to services and verbal consent obtained.   Follow up plan:  Telephone appointment with care coordination team member scheduled for:  08/21/22  Encounter Outcome:  Pt. Scheduled  [facility] Coordination Care Guide  Direct Dial: [phone]

## 2022-08-21 NOTE — Patient Outreach (Signed)
Care Coordination   Initial Visit Note   08/21/2022  Name: Stephen Walton MRN: [id number] DOB: [birth year]  Stephen Walton is a 67 y.o. year old male who sees Stephen Walton, [place] for primary care. I spoke with Stephen Walton by phone today.  What matters to the patients health and wellness today?  Obtain Counseling & Supportive Services.   Goals Addressed               This Visit's Progress     Obtain Counseling & Supportive Services. (pt-stated)   On track     Care Coordination Interventions:  Interventions Today    Flowsheet Row Most Recent Value  Chronic Disease   Chronic disease during today's visit Hypertension (HTN), Other  [Prostate Cancer, Obesity, Depression & Anxiety]  General Interventions   General Interventions Discussed/Reviewed Doctor Visits, Vaccines, Labs, General Interventions Discussed, General Interventions Reviewed, Annual Eye Exam, Durable Medical Equipment (DME), Health Screening, Community Resources, Level of Care  [Encouraged]  Labs Hgb A1c annually  [Encouraged]  Vaccines COVID-19, Flu, Pneumonia, RSV, Shingles, Tetanus/Pertussis/Diphtheria  [Encouraged]  Doctor Visits Discussed/Reviewed Doctor Visits Discussed, Specialist, Doctor Visits Reviewed, Annual Wellness Visits, PCP  [Encouraged]  Health Screening Bone Density, Colonoscopy, Prostate  [Encouraged]  PCP/Specialist Visits Compliance with follow-up visit  [Encouraged]  Level of Care Applications, Personal Care Services  [Encouraged]  Applications Medicaid, Personal Care Services  [Encouraged]  Exercise Interventions   Exercise Discussed/Reviewed Exercise Discussed, Assistive device use and maintanence, Exercise Reviewed, Physical Activity, Weight Managment  [Encouraged]  Physical Activity Discussed/Reviewed Physical Activity Discussed, Physical Activity Reviewed, Types of exercise, Home Exercise Program (HEP)  [Encouraged]  Weight Management Weight loss  [Encouraged]  Education  Interventions   Education Provided Provided Therapist, sports, Provided Education, Provided Web-based Education  [Encouraged]  Provided Verbal Education On Nutrition, Mental Health/Coping with Illness, When to see the doctor, Eye Care, Applications, Exercise, Medication, Development worker, community, [employer] Resources  [Encouraged]  Applications Medicaid, Personal Care Services  [Encouraged]  Mental Health Interventions   Mental Health Discussed/Reviewed Mental Health Discussed, Anxiety, Depression, Grief and Loss, Mental Health Reviewed, Coping Strategies, Substance Abuse, Suicide, Crisis, Other  [Domestic Violence]  Nutrition Interventions   Nutrition Discussed/Reviewed Nutrition Discussed, Adding fruits and vegetables, Increasing proteins, Decreasing fats, Nutrition Reviewed, Carbohydrate meal planning, Decreasing sugar intake, Decreasing salt, Fluid intake, Portion sizes  [Encouraged]  Pharmacy Interventions   Pharmacy Dicussed/Reviewed Pharmacy Topics Discussed, Pharmacy Topics Reviewed, Medication Adherence, Affording Medications  [Encouraged]  Safety Interventions   Safety Discussed/Reviewed Safety Discussed, Safety Reviewed  [Encouraged]  Advanced Directive Interventions   Advanced Directives Discussed/Reviewed Advanced Directives Discussed  [Encouraged]     Assessed Social Determinant of Health Barriers. Discussed Plans for Ongoing Care Management Follow Up. Provided Careers information officer Information for Care Management Team Members. Screened for Signs & Symptoms of Depression, Related to Chronic Disease State.  PHQ2 & PHQ9 Depression Screen Completed & Results Reviewed.  Suicidal Ideation & Homicidal Ideation Assessed - None Present.   Domestic Violence Assessed - None Present. Access to Weapons Assessed - None Present.   Active Listening & Reflection Utilized.  Verbalization of Feelings Encouraged.  Emotional Support Provided. Feelings of Caregiver Stress Acknowledged. Caregiver Resources  Reviewed. Caregiver Support Groups Provided. Self-Enrollment in Caregiver Support Group of Interest Emphasized. Crisis Support Information, Agencies, Services & Resources Discussed. Problem Solving Interventions Identified. Task-Centered Solutions Implemented.   Solution-Focused Strategies Developed. Acceptance & Commitment Therapy Introduced. Brief Cognitive Behavioral Therapy Initiated. Client-Centered Therapy Enacted. Reviewed Prescription Medications & Discussed Importance  of Compliance. Quality of Sleep Assessed & Sleep Hygiene Techniques Promoted. Encouraged Referral to Psychiatrist for Psychotropic Medication Administration & Management. Encouraged Referral to Therapist for Psychotherapeutic Counseling & Supportive Services. Please Contact CSW Directly (# [phone]), if You Have Questions, Need Assistance, or If Additional Social Work Needs Are Identified Between Now & Our Next Scheduled Follow-Up Outreach Call.        SDOH assessments and interventions completed:  Yes.  SDOH Interventions Today    Flowsheet Row Most Recent Value  SDOH Interventions   Food Insecurity Interventions Intervention Not Indicated  Housing Interventions Intervention Not Indicated  Transportation Interventions Intervention Not Indicated, Patient Resources (Friends/Family)  Utilities Interventions Intervention Not Indicated  Alcohol Usage Interventions Intervention Not Indicated (Score <7)  Financial Strain Interventions Intervention Not Indicated  Physical Activity Interventions Patient Declined  Stress Interventions Intervention Not Indicated, Offered [employer], Provide Counseling  Social Connections Interventions Intervention Not Indicated     Care Coordination Interventions:  Yes, provided.   Follow up plan: Follow up call scheduled for 09/12/2022 at 9:30 am.   Encounter Outcome:  Pt. Visit Completed.   Stephen Walton, BSW, MSW, LCSW  Licensed Research scientist (life sciences) [facility]  Mailing [place] [address], [place], [place] [address] Physical Address-[address], [place], [place] [address] Toll Free Main # [phone] Fax # [phone] Cell # [phone] [name]

## 2022-08-21 NOTE — Patient Instructions (Signed)
Visit Information  Thank you for taking time to visit with me today. Please don't hesitate to contact me if I can be of assistance to you.   Following are the goals we discussed today:   Goals Addressed               This Visit's Progress     Obtain Counseling & Supportive Services. (pt-stated)   On track     Care Coordination Interventions:  Interventions Today    Flowsheet Row Most Recent Value  Chronic Disease   Chronic disease during today's visit Hypertension (HTN), Other  [Prostate Cancer, Obesity, Depression & Anxiety]  General Interventions   General Interventions Discussed/Reviewed Doctor Visits, Vaccines, Labs, General Interventions Discussed, General Interventions Reviewed, Annual Eye Exam, Durable Medical Equipment (DME), Health Screening, Community Resources, Level of Care  [Encouraged]  Labs Hgb A1c annually  [Encouraged]  Vaccines COVID-19, Flu, Pneumonia, RSV, Shingles, Tetanus/Pertussis/Diphtheria  [Encouraged]  Doctor Visits Discussed/Reviewed Doctor Visits Discussed, Specialist, Doctor Visits Reviewed, Annual Wellness Visits, PCP  [Encouraged]  Health Screening Bone Density, Colonoscopy, Prostate  [Encouraged]  PCP/Specialist Visits Compliance with follow-up visit  [Encouraged]  Level of Care Applications, Personal Care Services  [Encouraged]  Applications Medicaid, Personal Care Services  [Encouraged]  Exercise Interventions   Exercise Discussed/Reviewed Exercise Discussed, Assistive device use and maintanence, Exercise Reviewed, Physical Activity, Weight Managment  [Encouraged]  Physical Activity Discussed/Reviewed Physical Activity Discussed, Physical Activity Reviewed, Types of exercise, Home Exercise Program (HEP)  [Encouraged]  Weight Management Weight loss  [Encouraged]  Education Interventions   Education Provided Provided Therapist, sports, Provided Education, Provided Web-based Education  [Encouraged]  Provided Verbal Education On Nutrition, Mental  Health/Coping with Illness, When to see the doctor, Eye Care, Applications, Exercise, Medication, Development worker, community, [employer] Resources  [Encouraged]  Applications Medicaid, Personal Care Services  [Encouraged]  Mental Health Interventions   Mental Health Discussed/Reviewed Mental Health Discussed, Anxiety, Depression, Grief and Loss, Mental Health Reviewed, Coping Strategies, Substance Abuse, Suicide, Crisis, Other  [Domestic Violence]  Nutrition Interventions   Nutrition Discussed/Reviewed Nutrition Discussed, Adding fruits and vegetables, Increasing proteins, Decreasing fats, Nutrition Reviewed, Carbohydrate meal planning, Decreasing sugar intake, Decreasing salt, Fluid intake, Portion sizes  [Encouraged]  Pharmacy Interventions   Pharmacy Dicussed/Reviewed Pharmacy Topics Discussed, Pharmacy Topics Reviewed, Medication Adherence, Affording Medications  [Encouraged]  Safety Interventions   Safety Discussed/Reviewed Safety Discussed, Safety Reviewed  [Encouraged]  Advanced Directive Interventions   Advanced Directives Discussed/Reviewed Advanced Directives Discussed  [Encouraged]     Assessed Social Determinant of Health Barriers. Discussed Plans for Ongoing Care Management Follow Up. Provided Careers information officer Information for Care Management Team Members. Screened for Signs & Symptoms of Depression, Related to Chronic Disease State.  PHQ2 & PHQ9 Depression Screen Completed & Results Reviewed.  Suicidal Ideation & Homicidal Ideation Assessed - None Present.   Domestic Violence Assessed - None Present. Access to Weapons Assessed - None Present.   Active Listening & Reflection Utilized.  Verbalization of Feelings Encouraged.  Emotional Support Provided. Feelings of Caregiver Stress Acknowledged. Caregiver Resources Reviewed. Caregiver Support Groups Provided. Self-Enrollment in Caregiver Support Group of Interest Emphasized. Crisis Support Information, Agencies, Services & Resources  Discussed. Problem Solving Interventions Identified. Task-Centered Solutions Implemented.   Solution-Focused Strategies Developed. Acceptance & Commitment Therapy Introduced. Brief Cognitive Behavioral Therapy Initiated. Client-Centered Therapy Enacted. Reviewed Prescription Medications & Discussed Importance of Compliance. Quality of Sleep Assessed & Sleep Hygiene Techniques Promoted. Encouraged Referral to Psychiatrist for Psychotropic Medication Administration & Management. Encouraged Referral to Therapist for Psychotherapeutic Counseling &  Supportive Services. Please Contact CSW Directly (# [phone]), if You Have Questions, Need Assistance, or If Additional Social Work Needs Are Identified Between Now & Our Next Scheduled Follow-Up Outreach Call.      Our next appointment is by telephone on 09/12/2022 at 9:30 am.   Please call the care guide team at [phone] if you need to cancel or reschedule your appointment.   If you are experiencing a Mental Health or Behavioral Health Crisis or need someone to talk to, please call the Suicide and Crisis Lifeline: 988 call the [place] National Suicide Prevention Lifeline: (718)384-2743 or TTY: 740-879-0188 TTY 916-040-0464) to talk to a trained counselor call 1-800-273-TALK (toll free, 24 hour hotline) go to [facility] [address] [place] [phone]) call the [facility] Crisis Line: ([phone] call 911  Patient verbalizes understanding of instructions and care plan provided today and agrees to view in MyChart. Active MyChart status and patient understanding of how to access instructions and care plan via MyChart confirmed with patient.     Telephone follow up appointment with care management team member scheduled for:  09/12/2022 at 9:30 am.   Danford Bad, BSW, MSW, LCSW  Licensed Clinical Social Worker  [facility]  Mailing  [place], [place], [place] [address] Physical Address-[address] [place], [place] [address] Toll Free Main # [phone] Fax # [phone] Cell # [phone] [name]

## 2022-08-26 ENCOUNTER — Ambulatory Visit (INDEPENDENT_AMBULATORY_CARE_PROVIDER_SITE_OTHER): Payer: Medicare Other

## 2022-08-26 ENCOUNTER — Telehealth: Payer: Self-pay

## 2022-08-26 DIAGNOSIS — T83038A Leakage of other indwelling urethral catheter, initial encounter: Secondary | ICD-10-CM

## 2022-08-26 DIAGNOSIS — R310 Gross hematuria: Secondary | ICD-10-CM | POA: Diagnosis not present

## 2022-08-26 NOTE — Progress Notes (Signed)
Patient is present to today with gross hematuria in cathter bag and patient states that his cathter is leaking. Patient cathter was flushed with 200 ml of sterile water with clear return. Cathter overnight bag was replaced and 20 cc inflated balloon.

## 2022-08-26 NOTE — Telephone Encounter (Signed)
Patient call in today and states that he is seeing gross hematuria and cathter is leaking. Patient was offer an appointment with the nurse to get cathter flush and bag change Patient voiced understanding.Marland Kitchen

## 2022-09-05 ENCOUNTER — Encounter: Payer: Medicare Other | Admitting: *Deleted

## 2022-09-11 ENCOUNTER — Ambulatory Visit (INDEPENDENT_AMBULATORY_CARE_PROVIDER_SITE_OTHER): Payer: Medicare Other | Admitting: Urology

## 2022-09-11 VITALS — BP 141/85 | HR 75

## 2022-09-11 DIAGNOSIS — R339 Retention of urine, unspecified: Secondary | ICD-10-CM | POA: Diagnosis not present

## 2022-09-11 DIAGNOSIS — R310 Gross hematuria: Secondary | ICD-10-CM

## 2022-09-11 DIAGNOSIS — E291 Testicular hypofunction: Secondary | ICD-10-CM

## 2022-09-11 MED ORDER — DUTASTERIDE 0.5 MG PO CAPS: 0.5000 mg | ORAL_CAPSULE | Freq: Every day | ORAL | 3 refills | Status: DC

## 2022-09-11 MED ORDER — CIPROFLOXACIN HCL 500 MG PO TABS
500.0000 mg | ORAL_TABLET | Freq: Once | ORAL | Status: AC
Start: 2022-09-11 — End: 2022-09-11
  Administered 2022-09-11: 500 mg via ORAL

## 2022-09-11 MED ORDER — TESTOSTERONE CYPIONATE 200 MG/ML IM SOLN: 100.0000 mg | INTRAMUSCULAR | 3 refills | Status: DC

## 2022-09-11 NOTE — Progress Notes (Signed)
09/11/2022 9:28 AM   [name] 02-07-1956 [id number]  Referring provider: [facility] [address] [place],  [place] [address]  Gross hematuria   HPI: [name] is a 67yo here for evaluation of gross hematuria. He notes intermittent painless hematuria when he sits on his riding lawnmower and does any strenuous activity. No dysuria or pelvic pain. No fevers   PMH: Past Medical History:  Diagnosis Date   CAD (coronary artery disease) 10/2021   moderate disease in left circumflex artery,   Chest pain, atypical    Erectile dysfunction    2024   H/O echocardiogram 10/04/2011   EF >55%;mild concentric LVH; normals systolic & diastolic dysfunction; mild aortic stenosis   Heart failure with mildly reduced ejection fraction (HFmrEF) (HCC) 2023   History of nuclear stress test 10/04/2011   low risk; small amount of basal inferior bowel artifact   Hyperlipidemia    Hypertension    OSA on CPAP    OSA on CPAP     Surgical History: Past Surgical History:  Procedure Laterality Date   COLONOSCOPY N/A 11/14/2020   Procedure: COLONOSCOPY;  Surgeon: Franky Macho, MD;  Location: [facility];  Service: Gastroenterology;  Laterality: N/A;    Home Medications:  Allergies as of 09/11/2022   No Known Allergies      Medication List        Accurate as of September 11, 2022  9:28 AM. If you have any questions, ask your nurse or doctor.          alfuzosin 10 MG 24 hr tablet Commonly known as: UROXATRAL Take 1 tablet (10 mg total) by mouth in the morning and at bedtime.   aspirin 81 MG tablet Take 81 mg by mouth daily.   buPROPion 300 MG 24 hr tablet Commonly known as: WELLBUTRIN XL Take 300 mg by mouth daily.   busPIRone 15 MG tablet Commonly known as: BUSPAR Take 15 mg by mouth at bedtime.   ciprofloxacin 500 MG tablet Commonly known as: Cipro Take 1 tablet (500 mg total) by mouth 2 (two) times daily. One po bid x 7 days   diltiazem  120 MG 24 hr capsule Commonly known as: Cardizem CD Take 1 capsule (120 mg total) by mouth daily.   dutasteride 0.5 MG capsule Commonly known as: AVODART Take 0.5 mg by mouth daily.   fish oil-omega-3 fatty acids 1000 MG capsule Take 1 g by mouth daily.   hydrALAZINE 25 MG tablet Commonly known as: APRESOLINE Take 1 tablet (25 mg total) by mouth 2 (two) times daily.   nitroGLYCERIN 0.4 MG SL tablet Commonly known as: NITROSTAT Place 1 tablet (0.4 mg total) under the tongue every 5 (five) minutes as needed for chest pain.   NON FORMULARY at bedtime. CPAP   OZEMPIC (0.25 OR 0.5 MG/DOSE) [place] Inject 0.5 mg into the skin once a week.   rosuvastatin 20 MG tablet Commonly known as: CRESTOR Take 1 tablet (20 mg total) by mouth daily.   silodosin 8 MG Caps capsule Commonly known as: RAPAFLO Take 1 capsule (8 mg total) by mouth 2 (two) times daily.   testosterone cypionate 200 MG/ML injection Commonly known as: DEPOTESTOSTERONE CYPIONATE Inject 0.5 mLs (100 mg total) into the muscle every 7 (seven) days.        Allergies: No Known Allergies  Family History: Family History  Problem Relation Age of Onset   Heart attack Brother     Social History:  reports that he has quit smoking. He has been exposed to tobacco smoke. He has never used smokeless tobacco. He reports that he does not currently use alcohol. He reports that he does not use drugs.  ROS: All other review of systems were reviewed and are negative except what is noted above in HPI  Physical Exam: BP (!) 141/85   Pulse 75   Constitutional:  Alert and oriented, No acute distress. HEENT:  AT, moist mucus membranes.  Trachea midline, no masses. Cardiovascular: No clubbing, cyanosis, or edema. Respiratory: Normal respiratory effort, no increased work of breathing. GI: Abdomen is soft, nontender, nondistended, no abdominal masses GU: No CVA tenderness.  Lymph: No cervical or inguinal lymphadenopathy. Skin: No  rashes, bruises or suspicious lesions. Neurologic: Grossly intact, no focal deficits, moving all 4 extremities. Psychiatric: Normal mood and affect.  Laboratory Data: Lab Results  Component Value Date   WBC 8.8 07/27/2022   HGB 13.7 07/27/2022   HCT 42.5 07/27/2022   MCV 81.0 07/27/2022   PLT 314 07/27/2022    Lab Results  Component Value Date   CREATININE 0.96 07/27/2022    Lab Results  Component Value Date   PSA 4.0 10/21/2019   PSA 3.9 03/22/2019    Lab Results  Component Value Date   TESTOSTERONE 528 07/04/2022    No results found for: "HGBA1C"  Urinalysis    Component Value Date/Time   COLORURINE YELLOW 08/15/2022 0524   APPEARANCEUR CLEAR 08/15/2022 0524   APPEARANCEUR Clear 05/30/2022 1403   LABSPEC 1.015 08/15/2022 0524   PHURINE 5.0 08/15/2022 0524   GLUCOSEU NEGATIVE 08/15/2022 0524   HGBUR NEGATIVE 08/15/2022 0524   BILIRUBINUR NEGATIVE 08/15/2022 0524   BILIRUBINUR Negative 05/30/2022 1403   KETONESUR NEGATIVE 08/15/2022 0524   PROTEINUR NEGATIVE 08/15/2022 0524   UROBILINOGEN 1.0 10/29/2021 1542   NITRITE NEGATIVE 08/15/2022 0524   LEUKOCYTESUR NEGATIVE 08/15/2022 0524    Lab Results  Component Value Date   LABMICR See below: 05/30/2022   WBCUA 0-5 05/30/2022   LABEPIT 0-10 05/30/2022   MUCUS Present 07/11/2021   BACTERIA NONE SEEN 08/15/2022    Pertinent Imaging:  No results found for this or any previous visit.  No results found for this or any previous visit.  No results found for this or any previous visit.  No results found for this or any previous visit.  Results for orders placed during the hospital encounter of 04/24/15  US Renal  Narrative CLINICAL DATA:  Proteinuria  EXAM: RENAL / URINARY TRACT ULTRASOUND COMPLETE  COMPARISON:  None.  FINDINGS: Right Kidney:  Length: 12.7 cm. Echogenicity within normal limits. No mass or hydronephrosis visualized.  Left Kidney:  Length: 12.0 cm. Echogenicity within normal  limits. No mass or hydronephrosis visualized.  Bladder:  Appears normal for degree of bladder distention.  IMPRESSION: Normal renal ultrasound.   Electronically Signed By: Marlan Palau M.D. On: 04/24/2015 09:56  No valid procedures specified. No results found for this or any previous visit.  No results found for this or any previous visit.   Assessment & Plan:    1. Urinary retention -patient scheduled for Urolift - INSERT,TEMP INDWELLING BLAD CATH,SIMPLE  2. Gross hematuria -related to irritation from foley. Patient instructed to hydrate and call if he develops pelvic pain, worsening hematuria or if the foley stops draining   No follow-ups on file.  Wilkie Aye, MD  [facility] [place]

## 2022-09-11 NOTE — Progress Notes (Signed)
Cath Change/ Replacement  Patient is present today for a catheter change due to urinary retention.  10ml of water was removed from the balloon, a 16FR foley cath was removed without difficulty.  Patient was cleaned and prepped in a sterile fashion with betadine and 2% lidocaine jelly was instilled into the urethra. A 16 FR foley cath was replaced into the bladder, no complications were noted. Urine return was noted 20ml and urine was yellow in color. The balloon was filled with 10ml of sterile water. A bed bag was attached for drainage.  A night bag was also given to the patient and patient was given instruction on how to change from one bag to another. Patient was given proper instruction on catheter care.    Performed by: Guss Bunde, CMA  Follow up: as scheduled for surgery.

## 2022-09-11 NOTE — H&P (View-Only) (Signed)
09/11/2022 9:28 AM   Stephen Walton 02-07-1956 [id number]  Referring provider: [facility] [address] [place],  [place] [address]  Gross hematuria   HPI: [name] is a 67yo here for evaluation of gross hematuria. He notes intermittent painless hematuria when he sits on his riding lawnmower and does any strenuous activity. No dysuria or pelvic pain. No fevers   PMH: Past Medical History:  Diagnosis Date   CAD (coronary artery disease) 10/2021   moderate disease in left circumflex artery,   Chest pain, atypical    Erectile dysfunction    2024   H/O echocardiogram 10/04/2011   EF >55%;mild concentric LVH; normals systolic & diastolic dysfunction; mild aortic stenosis   Heart failure with mildly reduced ejection fraction (HFmrEF) (HCC) 2023   History of nuclear stress test 10/04/2011   low risk; small amount of basal inferior bowel artifact   Hyperlipidemia    Hypertension    OSA on CPAP    OSA on CPAP     Surgical History: Past Surgical History:  Procedure Laterality Date   COLONOSCOPY N/A 11/14/2020   Procedure: COLONOSCOPY;  Surgeon: Stephen Macho, MD;  Location: [facility];  Service: Gastroenterology;  Laterality: N/A;    Home Medications:  Allergies as of 09/11/2022   No Known Allergies      Medication List        Accurate as of September 11, 2022  9:28 AM. If you have any questions, ask your nurse or doctor.          alfuzosin 10 MG 24 hr tablet Commonly known as: UROXATRAL Take 1 tablet (10 mg total) by mouth in the morning and at bedtime.   aspirin 81 MG tablet Take 81 mg by mouth daily.   buPROPion 300 MG 24 hr tablet Commonly known as: WELLBUTRIN XL Take 300 mg by mouth daily.   busPIRone 15 MG tablet Commonly known as: BUSPAR Take 15 mg by mouth at bedtime.   ciprofloxacin 500 MG tablet Commonly known as: Cipro Take 1 tablet (500 mg total) by mouth 2 (two) times daily. One po bid x 7 days   diltiazem  120 MG 24 hr capsule Commonly known as: Cardizem CD Take 1 capsule (120 mg total) by mouth daily.   dutasteride 0.5 MG capsule Commonly known as: AVODART Take 0.5 mg by mouth daily.   fish oil-omega-3 fatty acids 1000 MG capsule Take 1 g by mouth daily.   hydrALAZINE 25 MG tablet Commonly known as: APRESOLINE Take 1 tablet (25 mg total) by mouth 2 (two) times daily.   nitroGLYCERIN 0.4 MG SL tablet Commonly known as: NITROSTAT Place 1 tablet (0.4 mg total) under the tongue every 5 (five) minutes as needed for chest pain.   NON FORMULARY at bedtime. CPAP   OZEMPIC (0.25 OR 0.5 MG/DOSE) [place] Inject 0.5 mg into the skin once a week.   rosuvastatin 20 MG tablet Commonly known as: CRESTOR Take 1 tablet (20 mg total) by mouth daily.   silodosin 8 MG Caps capsule Commonly known as: RAPAFLO Take 1 capsule (8 mg total) by mouth 2 (two) times daily.   testosterone cypionate 200 MG/ML injection Commonly known as: DEPOTESTOSTERONE CYPIONATE Inject 0.5 mLs (100 mg total) into the muscle every 7 (seven) days.        Allergies: No Known Allergies  Family History: Family History  Problem Relation Age of Onset   Heart attack Brother     Social History:  reports that he has quit smoking. He has been exposed to tobacco smoke. He has never used smokeless tobacco. He reports that he does not currently use alcohol. He reports that he does not use drugs.  ROS: All other review of systems were reviewed and are negative except what is noted above in HPI  Physical Exam: BP (!) 141/85   Pulse 75   Constitutional:  Alert and oriented, No acute distress. HEENT:  AT, moist mucus membranes.  Trachea midline, no masses. Cardiovascular: No clubbing, cyanosis, or edema. Respiratory: Normal respiratory effort, no increased work of breathing. GI: Abdomen is soft, nontender, nondistended, no abdominal masses GU: No CVA tenderness.  Lymph: No cervical or inguinal lymphadenopathy. Skin: No  rashes, bruises or suspicious lesions. Neurologic: Grossly intact, no focal deficits, moving all 4 extremities. Psychiatric: Normal mood and affect.  Laboratory Data: Lab Results  Component Value Date   WBC 8.8 07/27/2022   HGB 13.7 07/27/2022   HCT 42.5 07/27/2022   MCV 81.0 07/27/2022   PLT 314 07/27/2022    Lab Results  Component Value Date   CREATININE 0.96 07/27/2022    Lab Results  Component Value Date   PSA 4.0 10/21/2019   PSA 3.9 03/22/2019    Lab Results  Component Value Date   TESTOSTERONE 528 07/04/2022    No results found for: "HGBA1C"  Urinalysis    Component Value Date/Time   COLORURINE YELLOW 08/15/2022 0524   APPEARANCEUR CLEAR 08/15/2022 0524   APPEARANCEUR Clear 05/30/2022 1403   LABSPEC 1.015 08/15/2022 0524   PHURINE 5.0 08/15/2022 0524   GLUCOSEU NEGATIVE 08/15/2022 0524   HGBUR NEGATIVE 08/15/2022 0524   BILIRUBINUR NEGATIVE 08/15/2022 0524   BILIRUBINUR Negative 05/30/2022 1403   KETONESUR NEGATIVE 08/15/2022 0524   PROTEINUR NEGATIVE 08/15/2022 0524   UROBILINOGEN 1.0 10/29/2021 1542   NITRITE NEGATIVE 08/15/2022 0524   LEUKOCYTESUR NEGATIVE 08/15/2022 0524    Lab Results  Component Value Date   LABMICR See below: 05/30/2022   WBCUA 0-5 05/30/2022   LABEPIT 0-10 05/30/2022   MUCUS Present 07/11/2021   BACTERIA NONE SEEN 08/15/2022    Pertinent Imaging:  No results found for this or any previous visit.  No results found for this or any previous visit.  No results found for this or any previous visit.  No results found for this or any previous visit.  Results for orders placed during the hospital encounter of 04/24/15  US Renal  Narrative CLINICAL DATA:  Proteinuria  EXAM: RENAL / URINARY TRACT ULTRASOUND COMPLETE  COMPARISON:  None.  FINDINGS: Right Kidney:  Length: 12.7 cm. Echogenicity within normal limits. No mass or hydronephrosis visualized.  Left Kidney:  Length: 12.0 cm. Echogenicity within normal  limits. No mass or hydronephrosis visualized.  Bladder:  Appears normal for degree of bladder distention.  IMPRESSION: Normal renal ultrasound.   Electronically Signed By: Marlan Palau M.D. On: 04/24/2015 09:56  No valid procedures specified. No results found for this or any previous visit.  No results found for this or any previous visit.   Assessment & Plan:    1. Urinary retention -patient scheduled for Urolift - INSERT,TEMP INDWELLING BLAD CATH,SIMPLE  2. Gross hematuria -related to irritation from foley. Patient instructed to hydrate and call if he develops pelvic pain, worsening hematuria or if the foley stops draining   No follow-ups on file.  Wilkie Aye, MD  [facility] [place]

## 2022-09-12 ENCOUNTER — Ambulatory Visit: Payer: Self-pay | Admitting: *Deleted

## 2022-09-12 ENCOUNTER — Telehealth: Payer: Self-pay

## 2022-09-12 ENCOUNTER — Encounter: Payer: Self-pay | Admitting: *Deleted

## 2022-09-12 NOTE — Patient Instructions (Signed)
Visit Information  Thank you for taking time to visit with me today. Please don't hesitate to contact me if I can be of assistance to you.   Following are the goals we discussed today:   Goals Addressed               This Visit's Progress     COMPLETED: Obtain Counseling & Supportive Services. (pt-stated)   On track     Care Coordination Interventions:  Interventions Today    Flowsheet Row Most Recent Value  Chronic Disease   Chronic disease during today's visit Hypertension (HTN), Other  [Prostate Cancer, Obesity, Depression & Anxiety]  General Interventions   General Interventions Discussed/Reviewed Doctor Visits, Vaccines, Labs, General Interventions Discussed, General Interventions Reviewed, Annual Eye Exam, Durable Medical Equipment (DME), Health Screening, Community Resources, Level of Care  [Encouraged]  Labs Hgb A1c annually  [Encouraged]  Vaccines COVID-19, Flu, Pneumonia, RSV, Shingles, Tetanus/Pertussis/Diphtheria  [Encouraged]  Doctor Visits Discussed/Reviewed Doctor Visits Discussed, Specialist, Doctor Visits Reviewed, Annual Wellness Visits, PCP  [Encouraged]  Health Screening Bone Density, Colonoscopy, Prostate  [Encouraged]  PCP/Specialist Visits Compliance with follow-up visit  [Encouraged]  Level of Care Applications, Personal Care Services  [Encouraged]  Applications Medicaid, Personal Care Services  [Encouraged]  Exercise Interventions   Exercise Discussed/Reviewed Exercise Discussed, Assistive device use and maintanence, Exercise Reviewed, Physical Activity, Weight Managment  [Encouraged]  Physical Activity Discussed/Reviewed Physical Activity Discussed, Physical Activity Reviewed, Types of exercise, Home Exercise Program (HEP)  [Encouraged]  Weight Management Weight loss  [Encouraged]  Education Interventions   Education Provided Provided Therapist, sports, Provided Education, Provided Web-based Education  [Encouraged]  Provided Verbal Education On Nutrition,  Mental Health/Coping with Illness, When to see the doctor, Eye Care, Applications, Exercise, Medication, Development worker, community, [employer]  [Encouraged]  Applications Medicaid, Personal Care Services  [Encouraged]  Mental Health Interventions   Mental Health Discussed/Reviewed Mental Health Discussed, Anxiety, Depression, Grief and Loss, Mental Health Reviewed, Coping Strategies, Substance Abuse, Suicide, Crisis, Other  [Domestic Violence]  Nutrition Interventions   Nutrition Discussed/Reviewed Nutrition Discussed, Adding fruits and vegetables, Increasing proteins, Decreasing fats, Nutrition Reviewed, Carbohydrate meal planning, Decreasing sugar intake, Decreasing salt, Fluid intake, Portion sizes  [Encouraged]  Pharmacy Interventions   Pharmacy Dicussed/Reviewed Pharmacy Topics Discussed, Pharmacy Topics Reviewed, Medication Adherence, Affording Medications  [Encouraged]  Safety Interventions   Safety Discussed/Reviewed Safety Discussed, Safety Reviewed  [Encouraged]  Advanced Directive Interventions   Advanced Directives Discussed/Reviewed Advanced Directives Discussed  [Encouraged]     Active Listening & Reflection Utilized.  Verbalization of Feelings Encouraged.  Emotional Support Provided. Feelings of Hopefulness & Gratefulness Acknowledged. Diminished Symptoms of Anxiety & Stress Validated. Problem Solving Interventions Activated. Task-Centered Solutions Employed.   Solution-Focused Strategies Implemented. Acceptance & Commitment Therapy Performed Cognitive Behavioral Therapy Initiated. Client-Centered Therapy Indicated. Deep Breathing Exercises, Relaxation Techniques, & Mindfulness Meditation Strategies Taught & Implementation Encouraged Daily. Confirmed Disinterest in Receiving Assistance with Referral to Psychiatrist for Psychotropic Medication Administration & Management. Confirmed Disinterest in Receiving Assistance with Referral to Therapist for Psychotherapeutic Counseling  & Supportive Services. Confirmed Disinterest in Continuing to Receive Counseling & Supportive Services through CSW at Primary Care Provider, Terie Purser, Physician Assistant Certified with [facility] [phone]). Encouraged Contact with CSW (# [phone]), if You Have Questions, Need Assistance, or If Additional Social Work Needs Are Identified in The Near Future.      Please call the care guide team at [phone] if you need to cancel or reschedule your appointment.   If you  are experiencing a Mental Health or Behavioral Health Crisis or need someone to talk to, please call the Suicide and Crisis Lifeline: 988 call the [place] National Suicide Prevention Lifeline: 8036208947 or TTY: 251-322-3778 TTY 364-169-6368) to talk to a trained counselor call 1-800-273-TALK (toll free, 24 hour hotline) go to [facility] [address] [place] [phone]) call the [facility] Crisis Line: [phone] call 911  Patient verbalizes understanding of instructions and care plan provided today and agrees to view in MyChart. Active MyChart status and patient understanding of how to access instructions and care plan via MyChart confirmed with patient.     No further follow up required.  Danford Bad, BSW, MSW, LCSW  Licensed Restaurant manager, [employer] food [facility]  Mailing [place] [address] [place], [place] [address] Physical Address[address], [place], [place] [address] Toll Free Main # [phone] Fax # [phone] Cell # [phone] [name]

## 2022-09-12 NOTE — Telephone Encounter (Signed)
I spoke with Alfonso Ramus. We have discussed possible surgery dates and 09/30/2022 was agreed upon by all parties. Patient given information about surgery date, what to expect pre-operatively and post operatively.    We discussed that a pre-op nurse will be calling to set up the pre-op visit that will take place prior to surgery. Informed patient that our office will communicate any additional care to be provided after surgery.    Patients questions or concerns were discussed during our call. Advised to call our office should there be any additional information, questions or concerns that arise. Patient verbalized understanding.

## 2022-09-12 NOTE — Patient Outreach (Signed)
Care Coordination   Follow Up Visit Note   09/12/2022  Name: Stephen Walton MRN: [id number] DOB: Jun 19, 1955  Stephen Walton is a 67 y.o. year old male who sees Avis Epley, [place] for primary care. I spoke with [name] by phone today.  What matters to the patients health and wellness today?  Obtain Counseling & Supportive Services.   Goals Addressed               This Visit's Progress     COMPLETED: Obtain Counseling & Supportive Services. (pt-stated)   On track     Care Coordination Interventions:  Interventions Today    Flowsheet Row Most Recent Value  Chronic Disease   Chronic disease during today's visit Hypertension (HTN), Other  [Prostate Cancer, Obesity, Depression & Anxiety]  General Interventions   General Interventions Discussed/Reviewed Doctor Visits, Vaccines, Labs, General Interventions Discussed, General Interventions Reviewed, Annual Eye Exam, Durable Medical Equipment (DME), Health Screening, Community Resources, Level of Care  [Encouraged]  Labs Hgb A1c annually  [Encouraged]  Vaccines COVID-19, Flu, Pneumonia, RSV, Shingles, Tetanus/Pertussis/Diphtheria  [Encouraged]  Doctor Visits Discussed/Reviewed Doctor Visits Discussed, Specialist, Doctor Visits Reviewed, Annual Wellness Visits, PCP  [Encouraged]  Health Screening Bone Density, Colonoscopy, Prostate  [Encouraged]  PCP/Specialist Visits Compliance with follow-up visit  [Encouraged]  Level of Care Applications, Personal Care Services  [Encouraged]  Applications Medicaid, Personal Care Services  [Encouraged]  Exercise Interventions   Exercise Discussed/Reviewed Exercise Discussed, Assistive device use and maintanence, Exercise Reviewed, Physical Activity, Weight Managment  [Encouraged]  Physical Activity Discussed/Reviewed Physical Activity Discussed, Physical Activity Reviewed, Types of exercise, Home Exercise Program (HEP)  [Encouraged]  Weight Management Weight loss  [Encouraged]   Education Interventions   Education Provided Provided Therapist, sports, Provided Education, Provided Web-based Education  [Encouraged]  Provided Verbal Education On Nutrition, Mental Health/Coping with Illness, When to see the doctor, Eye Care, Applications, Exercise, Medication, Development worker, community, [employer]  [Encouraged]  Applications Medicaid, Personal Care Services  [Encouraged]  Mental Health Interventions   Mental Health Discussed/Reviewed Mental Health Discussed, Anxiety, Depression, Grief and Loss, Mental Health Reviewed, Coping Strategies, Substance Abuse, Suicide, Crisis, Other  [Domestic Violence]  Nutrition Interventions   Nutrition Discussed/Reviewed Nutrition Discussed, Adding fruits and vegetables, Increasing proteins, Decreasing fats, Nutrition Reviewed, Carbohydrate meal planning, Decreasing sugar intake, Decreasing salt, Fluid intake, Portion sizes  [Encouraged]  Pharmacy Interventions   Pharmacy Dicussed/Reviewed Pharmacy Topics Discussed, Pharmacy Topics Reviewed, Medication Adherence, Affording Medications  [Encouraged]  Safety Interventions   Safety Discussed/Reviewed Safety Discussed, Safety Reviewed  [Encouraged]  Advanced Directive Interventions   Advanced Directives Discussed/Reviewed Advanced Directives Discussed  [Encouraged]     Active Listening & Reflection Utilized.  Verbalization of Feelings Encouraged.  Emotional Support Provided. Feelings of Hopefulness & Gratefulness Acknowledged. Diminished Symptoms of Anxiety & Stress Validated. Problem Solving Interventions Activated. Task-Centered Solutions Employed.   Solution-Focused Strategies Implemented. Acceptance & Commitment Therapy Performed Cognitive Behavioral Therapy Initiated. Client-Centered Therapy Indicated. Deep Breathing Exercises, Relaxation Techniques, & Mindfulness Meditation Strategies Taught & Implementation Encouraged Daily. Confirmed Disinterest in Receiving Assistance with Referral  to Psychiatrist for Psychotropic Medication Administration & Management. Confirmed Disinterest in Receiving Assistance with Referral to Therapist for Psychotherapeutic Counseling & Supportive Services. Confirmed Disinterest in Continuing to Receive Counseling & Supportive Services through CSW at Primary Care Provider, Terie Purser, Physician Assistant Certified with [facility] [phone]). Encouraged Contact with CSW (# [phone]), if You Have Questions, Need Assistance, or If Additional Social Work Needs Are Identified in  The Near Future.      SDOH assessments and interventions completed:  Yes.  Care Coordination Interventions:  Yes, provided.   Follow up plan:  No further follow-up required.  Encounter Outcome:  Pt. Visit Completed.   Danford Bad, BSW, MSW, LCSW  Licensed Restaurant manager, fast food [facility]  Mailing [place] [address], [place], [place] [address] Physical Address[address], [place], [place] [address] Toll Free Main # [phone] Fax # [phone] Cell # [phone] [name]

## 2022-09-15 ENCOUNTER — Encounter: Payer: Self-pay | Admitting: Urology

## 2022-09-15 NOTE — Patient Instructions (Signed)

## 2022-09-18 ENCOUNTER — Other Ambulatory Visit: Payer: Self-pay | Admitting: Physician Assistant

## 2022-09-26 ENCOUNTER — Encounter (HOSPITAL_COMMUNITY)
Admission: RE | Admit: 2022-09-26 | Discharge: 2022-09-26 | Disposition: A | Discharge status: Home / Self Care | Payer: Medicare Other | Source: Ambulatory Visit | Attending: Urology | Admitting: Urology

## 2022-09-30 ENCOUNTER — Ambulatory Visit (HOSPITAL_COMMUNITY)
Admission: RE | Admit: 2022-09-30 | Discharge: 2022-09-30 | Disposition: A | Discharge status: Home / Self Care | Payer: Medicare Other | Attending: Urology | Admitting: Urology

## 2022-09-30 ENCOUNTER — Encounter (HOSPITAL_COMMUNITY): Payer: Self-pay | Admitting: Urology

## 2022-09-30 ENCOUNTER — Ambulatory Visit (HOSPITAL_BASED_OUTPATIENT_CLINIC_OR_DEPARTMENT_OTHER): Payer: Medicare Other | Admitting: Anesthesiology

## 2022-09-30 ENCOUNTER — Encounter (HOSPITAL_COMMUNITY)
Admission: RE | Disposition: A | Discharge status: Home / Self Care | Payer: Self-pay | Source: Home / Self Care | Attending: Urology

## 2022-09-30 ENCOUNTER — Ambulatory Visit (HOSPITAL_COMMUNITY): Payer: Medicare Other | Admitting: Anesthesiology

## 2022-09-30 DIAGNOSIS — N401 Enlarged prostate with lower urinary tract symptoms: Secondary | ICD-10-CM | POA: Diagnosis present

## 2022-09-30 DIAGNOSIS — I25119 Atherosclerotic heart disease of native coronary artery with unspecified angina pectoris: Secondary | ICD-10-CM | POA: Diagnosis not present

## 2022-09-30 DIAGNOSIS — R338 Other retention of urine: Secondary | ICD-10-CM | POA: Diagnosis not present

## 2022-09-30 DIAGNOSIS — Z8546 Personal history of malignant neoplasm of prostate: Secondary | ICD-10-CM | POA: Diagnosis not present

## 2022-09-30 DIAGNOSIS — Z79899 Other long term (current) drug therapy: Secondary | ICD-10-CM | POA: Insufficient documentation

## 2022-09-30 DIAGNOSIS — R310 Gross hematuria: Secondary | ICD-10-CM | POA: Insufficient documentation

## 2022-09-30 DIAGNOSIS — I10 Essential (primary) hypertension: Secondary | ICD-10-CM | POA: Insufficient documentation

## 2022-09-30 DIAGNOSIS — N138 Other obstructive and reflux uropathy: Secondary | ICD-10-CM

## 2022-09-30 DIAGNOSIS — F419 Anxiety disorder, unspecified: Secondary | ICD-10-CM | POA: Insufficient documentation

## 2022-09-30 DIAGNOSIS — F32A Depression, unspecified: Secondary | ICD-10-CM | POA: Insufficient documentation

## 2022-09-30 DIAGNOSIS — G4733 Obstructive sleep apnea (adult) (pediatric): Secondary | ICD-10-CM | POA: Diagnosis not present

## 2022-09-30 DIAGNOSIS — N400 Enlarged prostate without lower urinary tract symptoms: Secondary | ICD-10-CM

## 2022-09-30 DIAGNOSIS — Z87891 Personal history of nicotine dependence: Secondary | ICD-10-CM | POA: Insufficient documentation

## 2022-09-30 HISTORY — PX: CYSTOSCOPY WITH INSERTION OF UROLIFT: SHX6678

## 2022-09-30 SURGERY — CYSTOSCOPY WITH INSERTION OF UROLIFT
Anesthesia: General | Site: Bladder

## 2022-09-30 MED ORDER — MIDAZOLAM HCL 2 MG/2ML IJ SOLN
INTRAMUSCULAR | Status: AC
  Filled 2022-09-30: qty 2

## 2022-09-30 MED ORDER — PROPOFOL 10 MG/ML IV BOLUS
INTRAVENOUS | Status: DC | PRN
  Administered 2022-09-30: 200 mg via INTRAVENOUS
  Administered 2022-09-30: 40 mg via INTRAVENOUS

## 2022-09-30 MED ORDER — LACTATED RINGERS IV SOLN: INTRAVENOUS | Status: DC

## 2022-09-30 MED ORDER — PHENYLEPHRINE 80 MCG/ML (10ML) SYRINGE FOR IV PUSH (FOR BLOOD PRESSURE SUPPORT)
PREFILLED_SYRINGE | INTRAVENOUS | Status: AC
  Filled 2022-09-30: qty 10

## 2022-09-30 MED ORDER — LIDOCAINE 2% (20 MG/ML) 5 ML SYRINGE
INTRAMUSCULAR | Status: DC | PRN
  Administered 2022-09-30: 100 mg via INTRAVENOUS

## 2022-09-30 MED ORDER — FENTANYL CITRATE (PF) 100 MCG/2ML IJ SOLN
INTRAMUSCULAR | Status: AC
  Filled 2022-09-30: qty 2

## 2022-09-30 MED ORDER — ONDANSETRON HCL 4 MG/2ML IJ SOLN: 4.0000 mg | Freq: Once | INTRAMUSCULAR | Status: DC | PRN

## 2022-09-30 MED ORDER — ONDANSETRON HCL 4 MG/2ML IJ SOLN
INTRAMUSCULAR | Status: DC | PRN
Start: 2022-09-30 — End: 2022-09-30
  Administered 2022-09-30: 4 mg via INTRAVENOUS

## 2022-09-30 MED ORDER — TRAMADOL HCL 50 MG PO TABS: 50.0000 mg | ORAL_TABLET | Freq: Four times a day (QID) | ORAL | 0 refills | Status: DC | PRN

## 2022-09-30 MED ORDER — DEXMEDETOMIDINE HCL IN NACL 80 MCG/20ML IV SOLN
INTRAVENOUS | Status: AC
  Filled 2022-09-30: qty 20

## 2022-09-30 MED ORDER — DEXAMETHASONE SODIUM PHOSPHATE 4 MG/ML IJ SOLN
INTRAMUSCULAR | Status: DC | PRN
  Administered 2022-09-30: 5 mg via INTRAVENOUS

## 2022-09-30 MED ORDER — ONDANSETRON HCL 4 MG/2ML IJ SOLN
INTRAMUSCULAR | Status: AC
  Filled 2022-09-30: qty 2

## 2022-09-30 MED ORDER — GLYCOPYRROLATE PF 0.2 MG/ML IJ SOSY
PREFILLED_SYRINGE | INTRAMUSCULAR | Status: AC
  Filled 2022-09-30: qty 1

## 2022-09-30 MED ORDER — MIDAZOLAM HCL 5 MG/5ML IJ SOLN
INTRAMUSCULAR | Status: DC | PRN
  Administered 2022-09-30: 2 mg via INTRAVENOUS

## 2022-09-30 MED ORDER — PHENYLEPHRINE 80 MCG/ML (10ML) SYRINGE FOR IV PUSH (FOR BLOOD PRESSURE SUPPORT)
PREFILLED_SYRINGE | INTRAVENOUS | Status: DC | PRN
  Administered 2022-09-30: 160 ug via INTRAVENOUS
  Administered 2022-09-30: 80 ug via INTRAVENOUS
  Administered 2022-09-30: 160 ug via INTRAVENOUS

## 2022-09-30 MED ORDER — WATER FOR IRRIGATION, STERILE IR SOLN
Status: DC | PRN
  Administered 2022-09-30: 3000 mL
  Administered 2022-09-30: 500 mL

## 2022-09-30 MED ORDER — CHLORHEXIDINE GLUCONATE 0.12 % MT SOLN
15.0000 mL | Freq: Once | OROMUCOSAL | Status: AC
  Administered 2022-09-30: 15 mL via OROMUCOSAL

## 2022-09-30 MED ORDER — ORAL CARE MOUTH RINSE: 15.0000 mL | Freq: Once | OROMUCOSAL | Status: AC

## 2022-09-30 MED ORDER — HYDROMORPHONE HCL 1 MG/ML IJ SOLN: 0.2500 mg | INTRAMUSCULAR | Status: DC | PRN

## 2022-09-30 MED ORDER — LIDOCAINE HCL URETHRAL/MUCOSAL 2 % EX GEL
CUTANEOUS | Status: AC
  Filled 2022-09-30: qty 10

## 2022-09-30 MED ORDER — FENTANYL CITRATE (PF) 100 MCG/2ML IJ SOLN
INTRAMUSCULAR | Status: DC | PRN
  Administered 2022-09-30 (×2): 50 ug via INTRAVENOUS

## 2022-09-30 MED ORDER — CEFAZOLIN SODIUM-DEXTROSE 2-4 GM/100ML-% IV SOLN
INTRAVENOUS | Status: AC
  Filled 2022-09-30: qty 100

## 2022-09-30 MED ORDER — LIDOCAINE HCL (PF) 2 % IJ SOLN
INTRAMUSCULAR | Status: AC
  Filled 2022-09-30: qty 5

## 2022-09-30 MED ORDER — DEXMEDETOMIDINE HCL IN NACL 80 MCG/20ML IV SOLN
INTRAVENOUS | Status: DC | PRN
  Administered 2022-09-30: 8 ug via INTRAVENOUS

## 2022-09-30 MED ORDER — GLYCOPYRROLATE PF 0.2 MG/ML IJ SOSY
PREFILLED_SYRINGE | INTRAMUSCULAR | Status: DC | PRN
  Administered 2022-09-30: .2 mg via INTRAVENOUS

## 2022-09-30 MED ORDER — PHENYLEPHRINE HCL-NACL 20-0.9 MG/250ML-% IV SOLN
INTRAVENOUS | Status: AC
  Filled 2022-09-30: qty 250

## 2022-09-30 MED ORDER — MEPERIDINE HCL 50 MG/ML IJ SOLN: 6.2500 mg | INTRAMUSCULAR | Status: DC | PRN

## 2022-09-30 MED ORDER — CEFAZOLIN SODIUM-DEXTROSE 2-4 GM/100ML-% IV SOLN
2.0000 g | INTRAVENOUS | Status: AC
  Administered 2022-09-30: 2 g via INTRAVENOUS

## 2022-09-30 MED ORDER — PROPOFOL 10 MG/ML IV BOLUS
INTRAVENOUS | Status: AC
  Filled 2022-09-30: qty 20

## 2022-09-30 SURGICAL SUPPLY — 20 items
BAG DRAIN URO TABLE W/ADPT NS (BAG) ×1 IMPLANT
BAG DRN 8 ADPR NS SKTRN CSTL (BAG) ×1
BAG DRN RND TRDRP ANRFLXCHMBR (UROLOGICAL SUPPLIES) ×1
BAG HAMPER (MISCELLANEOUS) ×1 IMPLANT
BAG URINE DRAIN 2000ML AR STRL (UROLOGICAL SUPPLIES) IMPLANT
CATH FOLEY 2WAY SLVR 5CC 18FR (CATHETERS) IMPLANT
CLOTH BEACON ORANGE TIMEOUT ST (SAFETY) ×1 IMPLANT
GLOVE BIO SURGEON STRL SZ7 (GLOVE) IMPLANT
GLOVE BIO SURGEON STRL SZ8 (GLOVE) ×1 IMPLANT
GLOVE BIOGEL PI IND STRL 7.0 (GLOVE) ×2 IMPLANT
GOWN STRL REUS W/TWL LRG LVL3 (GOWN DISPOSABLE) ×1 IMPLANT
GOWN STRL REUS W/TWL XL LVL3 (GOWN DISPOSABLE) ×1 IMPLANT
KIT TURNOVER CYSTO (KITS) ×1 IMPLANT
MANIFOLD NEPTUNE II (INSTRUMENTS) ×1 IMPLANT
PACK CYSTO (CUSTOM PROCEDURE TRAY) ×1 IMPLANT
PAD ARMBOARD 7.5X6 YLW CONV (MISCELLANEOUS) ×1 IMPLANT
SYSTEM UROLIFT (Male Continence) IMPLANT
TOWEL OR 17X26 4PK STRL BLUE (TOWEL DISPOSABLE) ×1 IMPLANT
WATER STERILE IRR 3000ML UROMA (IV SOLUTION) ×1 IMPLANT
WATER STERILE IRR 500ML POUR (IV SOLUTION) ×1 IMPLANT

## 2022-09-30 NOTE — Transfer of Care (Signed)
Immediate Anesthesia Transfer of Care Note  Patient: Stephen Walton  Procedure(s) Performed: CYSTOSCOPY WITH INSERTION OF UROLIFT (Bladder)  Patient Location: PACU  Anesthesia Type:General  Level of Consciousness: drowsy  Airway & Oxygen Therapy: Patient Spontanous Breathing and Patient connected to face mask oxygen  Post-op Assessment: Report given to RN and Post -op Vital signs reviewed and stable  Post vital signs: Reviewed and stable  Last Vitals:  Vitals Value Taken Time  BP    Temp    Pulse    Resp    SpO2      Last Pain:  Vitals:   09/30/22 0856  TempSrc: Oral  PainSc: 0-No pain         Complications: No notable events documented.

## 2022-09-30 NOTE — Anesthesia Preprocedure Evaluation (Signed)
Anesthesia Evaluation  Patient identified by MRN, date of birth, ID band Patient awake    Reviewed: Allergy & Precautions, H&P , NPO status , Patient's Chart, lab work & pertinent test results  Airway Mallampati: II  TM Distance: >3 FB Neck ROM: Full    Dental  (+) Dental Advisory Given, Teeth Intact   Pulmonary sleep apnea and Continuous Positive Airway Pressure Ventilation , former smoker   Pulmonary exam normal breath sounds clear to auscultation       Cardiovascular Exercise Tolerance: Good hypertension, Pt. on medications + angina  + CAD  Normal cardiovascular exam(-) dysrhythmias  Rhythm:Regular Rate:Normal  1. Left ventricular ejection fraction, by estimation, is 40 to 45%. The  left ventricle has mildly decreased function. The left ventricle has no  regional wall motion abnormalities. There is mild left ventricular  hypertrophy. Left ventricular diastolic  parameters were normal. The average left ventricular global longitudinal  strain is -13.7 %. The global longitudinal strain is abnormal.   2. Right ventricular systolic function is normal. The right ventricular  size is normal.   3. The mitral valve is normal in structure. Trivial mitral valve  regurgitation.   4. The aortic valve is normal in structure. Aortic valve regurgitation is  not visualized. No aortic stenosis is present.   5. The inferior vena cava is normal in size with greater than 50%  respiratory variability, suggesting right atrial pressure of 3 mmHg.     Neuro/Psych  PSYCHIATRIC DISORDERS Anxiety Depression    negative neurological ROS     GI/Hepatic Neg liver ROS,GERD  Controlled,,  Endo/Other  negative endocrine ROS    Renal/GU negative Renal ROS Bladder dysfunction (prostate cancer)      Musculoskeletal negative musculoskeletal ROS (+)    Abdominal   Peds negative pediatric ROS (+)  Hematology negative hematology ROS (+)    Anesthesia Other Findings   Reproductive/Obstetrics negative OB ROS                             Anesthesia Physical Anesthesia Plan  ASA: 2  Anesthesia Plan: General   Post-op Pain Management: Dilaudid IV   Induction: Intravenous  PONV Risk Score and Plan: 4 or greater and Ondansetron and Dexamethasone  Airway Management Planned: LMA and Oral ETT  Additional Equipment:   Intra-op Plan:   Post-operative Plan: Extubation in OR  Informed Consent: I have reviewed the patients History and Physical, chart, labs and discussed the procedure including the risks, benefits and alternatives for the proposed anesthesia with the patient or authorized representative who has indicated his/her understanding and acceptance.     Dental advisory given  Plan Discussed with: CRNA and Surgeon  Anesthesia Plan Comments:         Anesthesia Quick Evaluation

## 2022-09-30 NOTE — Interval H&P Note (Signed)
History and Physical Interval Note:  09/30/2022 10:28 AM  Stephen Walton  has presented today for surgery, with the diagnosis of Benign prostatic hyperplasia with retention.  The various methods of treatment have been discussed with the patient and family. After consideration of risks, benefits and other options for treatment, the patient has consented to  Procedure(s): CYSTOSCOPY WITH INSERTION OF UROLIFT (N/A) as a surgical intervention.  The patient's history has been reviewed, patient examined, no change in status, stable for surgery.  I have reviewed the patient's chart and labs.  Questions were answered to the patient's satisfaction.     Wilkie Aye

## 2022-09-30 NOTE — Anesthesia Procedure Notes (Signed)
Procedure Name: LMA Insertion Date/Time: 09/30/2022 10:56 AM  Performed by: Julian Reil, CRNAPatient Re-evaluated:Patient Re-evaluated prior to induction Oxygen Delivery Method: Circle system utilized Preoxygenation: Pre-oxygenation with 100% oxygen Induction Type: IV induction Ventilation: Mask ventilation without difficulty LMA: LMA inserted LMA Size: 4.0 Tube type: Oral Placement Confirmation: positive ETCO2 Tube secured with: Tape Dental Injury: Teeth and Oropharynx as per pre-operative assessment

## 2022-09-30 NOTE — Op Note (Signed)
   PREOPERATIVE DIAGNOSIS:  Benign prostatic hypertrophy with bladder outlet obstruction.  POSTOPERATIVE DIAGNOSIS:  Benign prostatic hypertrophy with bladder outlet obstruction.  PROCEDURE:  Cystoscopy with implantation of UroLift devices, 6 implants.  SURGEON:  Wilkie Aye, M.D.  ANESTHESIA:  General  ANTIBIOTICS: ancef  SPECIMEN:  None.  DRAINS:  A 18-French Foley catheter.  BLOOD LOSS:  Minimal.  COMPLICATIONS:  None.  INDICATIONS: The Patient is an 67 year old male with BPH and bladder outlet obstruction.  He has failed medical therapy and has elected UroLift for definitive treatment.  FINDINGS OF PROCEDURE:  He was taken to the operating room where a genral anesthetic was induced.  He was placed in lithotomy position and was fitted with PAS hose.  His perineum and genitalia were prepped with chlorhexidine, and he was draped in usual sterile fashion.  Cystoscopy was performed using the UroLift scope and 0 degree lens. Examination revealed a normal urethra.  The external sphincter was intact.  Prostatic urethra was approximately 5 cm in length with lateral lobe enlargement. There was also little bit of bladder neck elevation. Inspection of bladder revealed mild-to-moderate trabeculation with no tumors, stones, or inflammation.  No cellules or diverticula were noted. Ureteral orifices were in their normal anatomic position effluxing clear urine.  After initial cystoscopy, the visual obturator was replaced with the first UroLift device.  This was turned to the 9 o'clock position and pulled back to the veru and then slightly advanced.  Pressure was then applied to the right lateral lobe and the UroLift device was deployed.  The second UroLift device was then inserted and applied to the left lateral lobe at 3 o'clock and deployed in the mid prostatic urethra. After this, there was still some apparent obstruction closer to the bladder neck.  So a second and  third level of UroLift your left device was applied between the mid urethra and the proximal urethra providing further patency to the prostatic urethra.  The scope was removed and a 18-French Foley catheter was inserted without difficulty. The balloon was filled with 10 mL sterile fluid, and the catheter was placed to straight drainage.  COMPLICATIONS: None   CONDITION: Stable, extubated, transferred to PACU  PLAN: The patient will be discharged home and followup in 2 days for a voiding trial.

## 2022-09-30 NOTE — Anesthesia Postprocedure Evaluation (Signed)
Anesthesia Post Note  Patient: Stephen Walton  Procedure(s) Performed: CYSTOSCOPY WITH INSERTION OF UROLIFT (Bladder)  Patient location during evaluation: Phase II Anesthesia Type: General Level of consciousness: awake and alert and oriented Pain management: pain level controlled Vital Signs Assessment: post-procedure vital signs reviewed and stable Respiratory status: spontaneous breathing, nonlabored ventilation and respiratory function stable Cardiovascular status: blood pressure returned to baseline and stable Postop Assessment: no apparent nausea or vomiting Anesthetic complications: no  No notable events documented.   Last Vitals:  Vitals:   09/30/22 1201 09/30/22 1218  BP: 117/75 132/78  Pulse: 65 60  Resp: 18 18  Temp:  (!) 36.4 C  SpO2: 96% 96%    Last Pain:  Vitals:   09/30/22 1218  TempSrc:   PainSc: 0-No pain                 Undra Trembath C Rhiley Solem

## 2022-10-02 ENCOUNTER — Ambulatory Visit (INDEPENDENT_AMBULATORY_CARE_PROVIDER_SITE_OTHER): Payer: Medicare Other | Admitting: Urology

## 2022-10-02 ENCOUNTER — Encounter (HOSPITAL_COMMUNITY): Payer: Self-pay | Admitting: Urology

## 2022-10-02 VITALS — BP 135/84 | HR 67 | Ht 69.0 in | Wt 245.0 lb

## 2022-10-02 DIAGNOSIS — R339 Retention of urine, unspecified: Secondary | ICD-10-CM

## 2022-10-02 MED ORDER — CIPROFLOXACIN HCL 500 MG PO TABS
500.0000 mg | ORAL_TABLET | Freq: Once | ORAL | Status: AC
Start: 2022-10-02 — End: 2022-10-02
  Administered 2022-10-02: 500 mg via ORAL

## 2022-10-02 NOTE — Progress Notes (Addendum)
Fill and Pull Catheter Removal  Patient is present today for a catheter removal.  Patient was cleaned and prepped in a sterile fashion of sterile water/ saline was instilled into the bladder when the patient felt the urge to urinate. 0ml of water was then drained from the balloon.  A 18FR foley cath was removed from the bladder no complications were noted .  Patient as then given some time to void on their own.  Patient can void  on their own after some time.  Patient tolerated well.  Performed by: Guss Bunde, CMA  Follow up/ Additional notes: MD to see after   Patient returns to office for a PVR.  Per patient he is able to void and is not in any pain but he does not have a strong stream.  PVR = 364.  Verbal from Dr. Ronne Binning to have patient CIC and come back Friday for a PVR.  Patient instructed on how to self catheterize.  Patient was able to insert catheter without difficulty and of urine return was noted.  Patient scheduled for a pvr nurse visit on 07/19.

## 2022-10-04 ENCOUNTER — Ambulatory Visit (INDEPENDENT_AMBULATORY_CARE_PROVIDER_SITE_OTHER): Payer: Medicare Other

## 2022-10-04 DIAGNOSIS — R339 Retention of urine, unspecified: Secondary | ICD-10-CM

## 2022-10-04 NOTE — Progress Notes (Signed)
post void residual=90

## 2022-10-05 ENCOUNTER — Emergency Department (HOSPITAL_COMMUNITY)
Admission: EM | Admit: 2022-10-05 | Discharge: 2022-10-05 | Disposition: A | Discharge status: Home / Self Care | Payer: Medicare Other | Attending: Emergency Medicine | Admitting: Emergency Medicine

## 2022-10-05 ENCOUNTER — Encounter (HOSPITAL_COMMUNITY): Payer: Self-pay | Admitting: Emergency Medicine

## 2022-10-05 ENCOUNTER — Other Ambulatory Visit: Payer: Self-pay

## 2022-10-05 DIAGNOSIS — T83511A Infection and inflammatory reaction due to indwelling urethral catheter, initial encounter: Secondary | ICD-10-CM | POA: Diagnosis present

## 2022-10-05 DIAGNOSIS — Z7982 Long term (current) use of aspirin: Secondary | ICD-10-CM | POA: Insufficient documentation

## 2022-10-05 DIAGNOSIS — R310 Gross hematuria: Secondary | ICD-10-CM | POA: Insufficient documentation

## 2022-10-05 DIAGNOSIS — E875 Hyperkalemia: Secondary | ICD-10-CM | POA: Insufficient documentation

## 2022-10-05 DIAGNOSIS — N390 Urinary tract infection, site not specified: Secondary | ICD-10-CM

## 2022-10-05 LAB — CBC
HCT: 48.3 % (ref 39.0–52.0)
Hemoglobin: 15.4 g/dL (ref 13.0–17.0)
MCH: 27 pg (ref 26.0–34.0)
MCHC: 31.9 g/dL (ref 30.0–36.0)
MCV: 84.6 fL (ref 80.0–100.0)
Platelets: 303 10*3/uL (ref 150–400)
RBC: 5.71 MIL/uL (ref 4.22–5.81)
RDW: 19.9 % — ABNORMAL HIGH (ref 11.5–15.5)
WBC: 10.5 10*3/uL (ref 4.0–10.5)
nRBC: 0 % (ref 0.0–0.2)

## 2022-10-05 LAB — BASIC METABOLIC PANEL
Anion gap: 6 (ref 5–15)
BUN: 16 mg/dL (ref 8–23)
CO2: 28 mmol/L (ref 22–32)
Calcium: 9.5 mg/dL (ref 8.9–10.3)
Chloride: 104 mmol/L (ref 98–111)
Creatinine, Ser: 0.92 mg/dL (ref 0.61–1.24)
GFR, Estimated: >60 mL/min (ref >60)
Glucose, Bld: 103 mg/dL — ABNORMAL HIGH (ref 70–99)
Potassium: 5.3 mmol/L — ABNORMAL HIGH (ref 3.5–5.1)
Sodium: 138 mmol/L (ref 135–145)

## 2022-10-05 LAB — URINALYSIS, ROUTINE W REFLEX MICROSCOPIC
Bilirubin Urine: NEGATIVE
Glucose, UA: NEGATIVE mg/dL
Ketones, ur: NEGATIVE mg/dL
Nitrite: NEGATIVE
Protein, ur: 100 mg/dL — AB
RBC / HPF: >50 RBC/hpf (ref 0–5)
Specific Gravity, Urine: 1.013 (ref 1.005–1.030)
pH: 6 (ref 5.0–8.0)

## 2022-10-05 LAB — POTASSIUM: Potassium: 5.4 mmol/L — ABNORMAL HIGH (ref 3.5–5.1)

## 2022-10-05 MED ORDER — CIPROFLOXACIN HCL 500 MG PO TABS: 500.0000 mg | ORAL_TABLET | Freq: Two times a day (BID) | ORAL | 0 refills | Status: DC

## 2022-10-05 MED ORDER — CIPROFLOXACIN HCL 250 MG PO TABS
500.0000 mg | ORAL_TABLET | Freq: Once | ORAL | Status: AC
  Administered 2022-10-05: 500 mg via ORAL
  Filled 2022-10-05: qty 2

## 2022-10-05 MED ORDER — SODIUM ZIRCONIUM CYCLOSILICATE 5 G PO PACK
10.0000 g | PACK | Freq: Once | ORAL | Status: AC
  Administered 2022-10-05: 10 g via ORAL
  Filled 2022-10-05: qty 2

## 2022-10-05 NOTE — ED Notes (Signed)
Pt voided 200cc of urine  Sent sample to lab Denies pain Requests self cath kits for home

## 2022-10-05 NOTE — ED Provider Notes (Signed)
[facility] AT [facility] Provider Note   CSN: [id number] Arrival date & time: 10/05/22  1358     History  Chief Complaint  Patient presents with   Hematuria    Stephen Walton is a 67 y.o. male presenting for evaluation of urinary retention and hematuria.  He has a history of BPH and had a UroLift surgical procedure 5 days ago under the care of Dr. Ronne Binning.  He was seen in their office 3 days ago at which time His catheter was removed and he passed a voiding test, was sent home, however developed some urinary retention so at return visit was instructed in self catheterization.  This morning once again he was having difficulty passing urine, he self cath but passed significant bloody urine, stating that he had 600 cc of urine during this procedure.  Feeling much improved now, and since arriving here was able to give [place] a small urine sample, still blood-tinged with a small blood clot without needing to cath.  He denies fevers or chills but does have a history of UTI and is concerned about possible infection.  He is scheduled to follow-up with urology in 2 weeks.  He is requesting additional self cath kits as he used his last 1 this morning.  The history is provided by the patient.       Home Medications Prior to Admission medications   Medication Sig Start Date End Date Taking? Authorizing Provider  ciprofloxacin (CIPRO) 500 MG tablet Take 1 tablet (500 mg total) by mouth every 12 (twelve) hours. 10/05/22  Yes Alexius Hangartner, Raynelle Fanning, PA-C  alfuzosin (UROXATRAL) 10 MG 24 hr tablet Take 1 tablet (10 mg total) by mouth in the morning and at bedtime. 12/26/21   McKenzie, [name], MD  aspirin 81 MG tablet Take 81 mg by mouth daily.    [provider]  buPROPion (WELLBUTRIN XL) 300 MG 24 hr tablet Take 300 mg by mouth at bedtime. 08/18/18   [provider]  busPIRone (BUSPAR) 15 MG tablet Take 15 mg by mouth at bedtime.    [provider]   cyanocobalamin (VITAMIN B12) 1000 MCG tablet Take 1,000 mcg by mouth daily.    [provider]  diltiazem (CARDIZEM CD) 120 MG 24 hr capsule Take 1 capsule (120 mg total) by mouth daily. Patient taking differently: Take 120 mg by mouth at bedtime. 08/16/22   Carlos Levering, NP  dutasteride (AVODART) 0.5 MG capsule Take 1 capsule (0.5 mg total) by mouth daily. 09/11/22   McKenzie, [name], MD  Ferrous Sulfate (IRON PO) Take 18 mg by mouth daily.    [provider]  fish oil-omega-3 fatty acids 1000 MG capsule Take 1 g by mouth daily.    [provider]  hydrALAZINE (APRESOLINE) 25 MG tablet Take 1 tablet (25 mg total) by mouth 2 (two) times daily. 07/05/22   Perlie Gold, PA-C  Multiple Vitamins-Minerals (MULTIVITAMIN WITH MINERALS) tablet Take 1 tablet by mouth daily.    [provider]  nitroGLYCERIN (NITROSTAT) 0.4 MG SL tablet PLACE 1 TABLET UNDER THE TONGUE EVERY 5 MINUTES AS NEEDED FOR CHEST PAIN 09/18/22   Lennette Bihari, MD  NON FORMULARY at bedtime. CPAP    [provider]  rosuvastatin (CRESTOR) 20 MG tablet Take 1 tablet (20 mg total) by mouth daily. Patient taking differently: Take 20 mg by mouth at bedtime. 08/23/21   Lennette Bihari, MD  Semaglutide, 2 MG/DOSE, (OZEMPIC, 2 MG/DOSE,) 8  MG/3ML SOPN Inject 2 mg into the skin once a week.    [provider]  silodosin (RAPAFLO) 8 MG CAPS capsule Take 1 capsule (8 mg total) by mouth 2 (two) times daily. Patient taking differently: Take 8 mg by mouth daily with breakfast. 08/07/22   McKenzie, [name], MD  testosterone cypionate (DEPOTESTOSTERONE CYPIONATE) 200 MG/ML injection Inject 0.5 mLs (100 mg total) into the muscle every 7 (seven) days. 09/11/22   McKenzie, [name], MD  traMADol (ULTRAM) 50 MG tablet Take 1 tablet (50 mg total) by mouth every 6 (six) hours as needed. 09/30/22 09/30/23  Malen Gauze, MD      Allergies    Patient has no known allergies.    Review of  Systems   Review of Systems  Constitutional:  Negative for fever.  HENT:  Negative for congestion and sore throat.   Eyes: Negative.   Respiratory:  Negative for chest tightness and shortness of breath.   Cardiovascular:  Negative for chest pain.  Gastrointestinal:  Negative for abdominal pain and nausea.  Genitourinary:  Positive for difficulty urinating and hematuria. Negative for dysuria.  Musculoskeletal:  Negative for arthralgias, joint swelling and neck pain.  Skin: Negative.  Negative for rash and wound.  Neurological:  Negative for dizziness, weakness, light-headedness, numbness and headaches.  Psychiatric/Behavioral: Negative.    All other systems reviewed and are negative.   Physical Exam Updated Vital Signs BP 130/76 (BP Location: Right Arm)   Pulse 66   Temp 97.9 F (36.6 C) (Oral)   Resp 18   Ht 5\' 9"  (1.753 m)   Wt 111.1 kg   SpO2 98%   BMI 36.18 kg/m  Physical Exam Vitals and nursing note reviewed.  Constitutional:      Appearance: He is well-developed.  HENT:     Head: Normocephalic and atraumatic.  Eyes:     Conjunctiva/sclera: Conjunctivae normal.  Cardiovascular:     Rate and Rhythm: Normal rate and regular rhythm.     Heart sounds: Normal heart sounds.  Pulmonary:     Effort: Pulmonary effort is normal.     Breath sounds: Normal breath sounds. No wheezing.  Abdominal:     General: Bowel sounds are normal.     Palpations: Abdomen is soft.     Tenderness: There is no abdominal tenderness.  Musculoskeletal:        General: Normal range of motion.     Cervical back: Normal range of motion.  Skin:    General: Skin is warm and dry.  Neurological:     Mental Status: He is alert.     ED Results / Procedures / Treatments   Labs (all labs ordered are listed, but only abnormal results are displayed) Labs Reviewed  URINALYSIS, ROUTINE W REFLEX MICROSCOPIC - Abnormal; Notable for the following components:      Result Value   APPearance HAZY (*)     Hgb urine dipstick LARGE (*)    Protein, ur 100 (*)    Leukocytes,Ua TRACE (*)    Bacteria, UA RARE (*)    All other components within normal limits  BASIC METABOLIC PANEL - Abnormal; Notable for the following components:   Potassium 5.3 (*)    Glucose, Bld 103 (*)    All other components within normal limits  CBC - Abnormal; Notable for the following components:   RDW 19.9 (*)    All other components within normal limits  POTASSIUM - Abnormal; Notable for the following components:  Potassium 5.4 (*)    All other components within normal limits    EKG None  Radiology No results found.  Procedures Procedures    Medications Ordered in ED Medications  sodium zirconium cyclosilicate (LOKELMA) packet 10 g (10 g Oral Given 10/05/22 1719)  ciprofloxacin (CIPRO) tablet 500 mg (500 mg Oral Given 10/05/22 1725)    ED Course/ Medical Decision Making/ A&P                             Medical Decision Making Patient senting with urinary retention and hematuria, has been instructed in self cath status post UroLift procedure.  He was able to give [place] a urine specimen here without, but had to self cath when he first woke this morning, with 600 cc of urine obtained.  He gave 200 cc of urine without cathing here, postvoid bladder scan 328 mL.  Blood in his urine is probably secondary to urethral trauma from self cathing, however he does have 21-50 WBCs, rare bacteria in his urine, given his instrumentation he is being covered with antibiotics, Cipro prescribed.  His be met obtained shows a hyperkalemia at 5.3 although his renal function is normal with a creatinine of 0.92.  He does state he has had a problem with elevated potassiums in the past.  Repeat blood draw potassium 5.4.  He is given a dose of Lokelma here and instructed he will need a recheck of that potassium level this week, he has an appointment with his PCP in 5 days.  He was given a written prescription for additional self cath  supplies.  Plan follow-up care with Dr. Ronne Binning.  Amount and/or Complexity of Data Reviewed Labs: ordered.    Details: Pertinent labs as mentioned above.  Risk Prescription drug management.           Final Clinical Impression(s) / ED Diagnoses Final diagnoses:  Gross hematuria  Urinary tract infection associated with catheterization of urinary tract, unspecified indwelling urinary catheter type, initial encounter (HCC)  Hyperkalemia    Rx / DC Orders ED Discharge Orders          Ordered    ciprofloxacin (CIPRO) 500 MG tablet  Every 12 hours        10/05/22 1719              Burgess Amor, PA-C 10/05/22 1748    Gloris Manchester, MD 10/07/22 [phone]

## 2022-10-05 NOTE — ED Triage Notes (Signed)
Pt via POV c/o hematuria after a recent prostate procedure last week. Pt went to the urology office on Thursday and was given instructions on self-catheterization. He went back yesterday for a recheck and was sent home with no new recommendations. Since last night he has had difficulty voiding and has been retaining urine. He performed self cath again and released about 600cc of urine with frank blood visible. Pt denies pain until he is trying to urinate.

## 2022-10-05 NOTE — Discharge Instructions (Signed)
You are being placed on antibiotics to treat you for possible early urinary infection, you do have rare bacteria in your urine but also have not increased number of white blood cells so we will cover you for this with Cipro.  Your repeat potassium today is again elevated and you have received a dose of the medicine which should return this to normal range.  However you will need a repeat blood test in a couple of days, I suggest midweek to make sure your potassium level has returned to normal.  You may have this done by Dr. Ronne Binning or your primary provider.  Continue to self cath as needed, as discussed your kidney function appears healthy today.

## 2022-10-07 NOTE — Telephone Encounter (Signed)
FYI  I discussed with patient- patient will come by the office to pick up catheter supplies if needed.

## 2022-10-08 ENCOUNTER — Encounter: Payer: Self-pay | Admitting: Urology

## 2022-10-08 NOTE — Patient Instructions (Signed)

## 2022-10-08 NOTE — Progress Notes (Signed)
10/02/2022 9:47 AM   Stephen Walton 07-Jul-1955 [id number]  Referring provider: Avis Epley, PA-C [address] [place],  [place] [address]  Followup urinary retention   HPI: Stephen Walton is a 67yo here for followup for BPH with urinary retention. He underwent Urolift 2 days ago. Hematuria resolved yesterday. Voiding trial passed today   PMH: Past Medical History:  Diagnosis Date   CAD (coronary artery disease) 10/2021   moderate disease in left circumflex artery,   Chest pain, atypical    Erectile dysfunction    2024   H/O echocardiogram 10/04/2011   EF >55%;mild concentric LVH; normals systolic & diastolic dysfunction; mild aortic stenosis   Heart failure with mildly reduced ejection fraction (HFmrEF) (HCC) 2023   History of nuclear stress test 10/04/2011   low risk; small amount of basal inferior bowel artifact   Hyperlipidemia    Hypertension    OSA on CPAP    OSA on CPAP     Surgical History: Past Surgical History:  Procedure Laterality Date   COLONOSCOPY N/A 11/14/2020   Procedure: COLONOSCOPY;  Surgeon: Franky Macho, MD;  Location: [facility];  Service: Gastroenterology;  Laterality: N/A;   CYSTOSCOPY WITH INSERTION OF UROLIFT N/A 09/30/2022   Procedure: CYSTOSCOPY WITH INSERTION OF UROLIFT;  Surgeon: Malen Gauze, MD;  Location: [facility] ORS;  Service: Urology;  Laterality: N/A;    Home Medications:  Allergies as of 10/02/2022   No Known Allergies      Medication List        Accurate as of October 02, 2022 11:59 PM. If you have any questions, ask your nurse or doctor.          alfuzosin 10 MG 24 hr tablet Commonly known as: UROXATRAL Take 1 tablet (10 mg total) by mouth in the morning and at bedtime.   aspirin 81 MG tablet Take 81 mg by mouth daily.   buPROPion 300 MG 24 hr tablet Commonly known as: WELLBUTRIN XL Take 300 mg by mouth at bedtime.   busPIRone 15 MG tablet Commonly known as: BUSPAR Take 15 mg by mouth at  bedtime.   cyanocobalamin 1000 MCG tablet Commonly known as: VITAMIN B12 Take 1,000 mcg by mouth daily.   diltiazem 120 MG 24 hr capsule Commonly known as: Cardizem CD Take 1 capsule (120 mg total) by mouth daily. What changed: when to take this   dutasteride 0.5 MG capsule Commonly known as: AVODART Take 1 capsule (0.5 mg total) by mouth daily.   fish oil-omega-3 fatty acids 1000 MG capsule Take 1 g by mouth daily.   hydrALAZINE 25 MG tablet Commonly known as: APRESOLINE Take 1 tablet (25 mg total) by mouth 2 (two) times daily.   IRON PO Take 18 mg by mouth daily.   multivitamin with minerals tablet Take 1 tablet by mouth daily.   nitroGLYCERIN 0.4 MG SL tablet Commonly known as: NITROSTAT PLACE 1 TABLET UNDER THE TONGUE EVERY 5 MINUTES AS NEEDED FOR CHEST PAIN   NON FORMULARY at bedtime. CPAP   Ozempic (2 MG/DOSE) 8 MG/3ML Sopn Generic drug: Semaglutide (2 MG/DOSE) Inject 2 mg into the skin once a week.   rosuvastatin 20 MG tablet Commonly known as: CRESTOR Take 1 tablet (20 mg total) by mouth daily. What changed: when to take this   silodosin 8 MG Caps capsule Commonly known as: RAPAFLO Take 1 capsule (8 mg total) by mouth 2 (two) times daily. What changed: when to take this   testosterone  cypionate 200 MG/ML injection Commonly known as: DEPOTESTOSTERONE CYPIONATE Inject 0.5 mLs (100 mg total) into the muscle every 7 (seven) days.   traMADol 50 MG tablet Commonly known as: Ultram Take 1 tablet (50 mg total) by mouth every 6 (six) hours as needed.        Allergies: No Known Allergies  Family History: Family History  Problem Relation Age of Onset   Heart attack Brother     Social History:  reports that he has quit smoking. He has been exposed to tobacco smoke. He has never used smokeless tobacco. He reports that he does not currently use alcohol. He reports that he does not use drugs.  ROS: All other review of systems were reviewed and are  negative except what is noted above in HPI  Physical Exam: BP 135/84   Pulse 67   Ht 5\' 9"  (1.753 m)   Wt 245 lb (111.1 kg)   BMI 36.18 kg/m   Constitutional:  Alert and oriented, No acute distress. HEENT: [place] AT, moist mucus membranes.  Trachea midline, no masses. Cardiovascular: No clubbing, cyanosis, or edema. Respiratory: Normal respiratory effort, no increased work of breathing. GI: Abdomen is soft, nontender, nondistended, no abdominal masses GU: No CVA tenderness.  Lymph: No cervical or inguinal lymphadenopathy. Skin: No rashes, bruises or suspicious lesions. Neurologic: Grossly intact, no focal deficits, moving all 4 extremities. Psychiatric: Normal mood and affect.  Laboratory Data: Lab Results  Component Value Date   WBC 10.5 10/05/2022   HGB 15.4 10/05/2022   HCT 48.3 10/05/2022   MCV 84.6 10/05/2022   PLT 303 10/05/2022    Lab Results  Component Value Date   CREATININE 0.92 10/05/2022    Lab Results  Component Value Date   PSA 4.0 10/21/2019   PSA 3.9 03/22/2019    Lab Results  Component Value Date   TESTOSTERONE 528 07/04/2022    No results found for: "HGBA1C"  Urinalysis    Component Value Date/Time   COLORURINE YELLOW 10/05/2022 1500   APPEARANCEUR HAZY (A) 10/05/2022 1500   APPEARANCEUR Clear 05/30/2022 1403   LABSPEC 1.013 10/05/2022 1500   PHURINE 6.0 10/05/2022 1500   GLUCOSEU NEGATIVE 10/05/2022 1500   HGBUR LARGE (A) 10/05/2022 1500   BILIRUBINUR NEGATIVE 10/05/2022 1500   BILIRUBINUR Negative 05/30/2022 1403   KETONESUR NEGATIVE 10/05/2022 1500   PROTEINUR 100 (A) 10/05/2022 1500   UROBILINOGEN 1.0 10/29/2021 1542   NITRITE NEGATIVE 10/05/2022 1500   LEUKOCYTESUR TRACE (A) 10/05/2022 1500    Lab Results  Component Value Date   LABMICR See below: 05/30/2022   WBCUA 0-5 05/30/2022   LABEPIT 0-10 05/30/2022   MUCUS Present 07/11/2021   BACTERIA RARE (A) 10/05/2022    Pertinent Imaging:  No results found for this or any  previous visit.  No results found for this or any previous visit.  No results found for this or any previous visit.  No results found for this or any previous visit.  Results for orders placed during the hospital encounter of 04/24/15  US Renal  Narrative CLINICAL DATA:  Proteinuria  EXAM: RENAL / URINARY TRACT ULTRASOUND COMPLETE  COMPARISON:  None.  FINDINGS: Right Kidney:  Length: 12.7 cm. Echogenicity within normal limits. No mass or hydronephrosis visualized.  Left Kidney:  Length: 12.0 cm. Echogenicity within normal limits. No mass or hydronephrosis visualized.  Bladder:  Appears normal for degree of bladder distention.  IMPRESSION: Normal renal ultrasound.   Electronically Signed By: Marlan Palau M.D. On: 04/24/2015  09:56  No valid procedures specified. No results found for this or any previous visit.  No results found for this or any previous visit.   Assessment & Plan:    1. Urinary retention -followup 2 weeks with PVR - ciprofloxacin (CIPRO) tablet 500 mg - Bladder Voiding Trial - BLADDER SCAN AMB NON-IMAGING - Patient may self catheterize   No follow-ups on file.  Wilkie Aye, MD  [facility]

## 2022-10-14 DIAGNOSIS — N529 Male erectile dysfunction, unspecified: Secondary | ICD-10-CM | POA: Insufficient documentation

## 2022-10-14 NOTE — Progress Notes (Signed)
Name: Stephen Walton DOB: [birth year] MRN: [id number]  History of Present Illness: Stephen Walton is a 67 y.o. male who presents today for follow up visit at [facility]. - GU history: 1. BPH with BOO and urinary retention. 2. Erectile dysfunction. 3. Hypogonadism.  Recent history:  - 09/30/2022: Underwent Urolift procedure by Dr. Ronne Binning.   - 10/02/2022: Postop visit with Dr. Ronne Binning. Passed voiding trial. The plan was followup 2 weeks with PVR; patient may self catheterize PRN.  - 10/04/2022: Nurse visit. PVR = 90 ml.  Today: He reports doing well with significantly improved urinary stream. He denies increased urinary urgency, frequency, nocturia, dysuria, gross hematuria, hesitancy, straining to void, or sensations of incomplete emptying.   Fall Screening: Do you usually have a device to assist in your mobility? No   Medications: Current Outpatient Medications  Medication Sig Dispense Refill   aspirin 81 MG tablet Take 81 mg by mouth daily.     buPROPion (WELLBUTRIN XL) 300 MG 24 hr tablet Take 300 mg by mouth at bedtime.     busPIRone (BUSPAR) 15 MG tablet Take 15 mg by mouth at bedtime.     cyanocobalamin (VITAMIN B12) 1000 MCG tablet Take 1,000 mcg by mouth daily.     diltiazem (CARDIZEM CD) 120 MG 24 hr capsule Take 1 capsule (120 mg total) by mouth daily. (Patient taking differently: Take 120 mg by mouth at bedtime.) 30 capsule 3   dutasteride (AVODART) 0.5 MG capsule Take 1 capsule (0.5 mg total) by mouth daily. 90 capsule 3   Ferrous Sulfate (IRON PO) Take 18 mg by mouth daily.     fish oil-omega-3 fatty acids 1000 MG capsule Take 1 g by mouth daily.     hydrALAZINE (APRESOLINE) 25 MG tablet Take 1 tablet (25 mg total) by mouth 2 (two) times daily. 180 tablet 3   Multiple Vitamins-Minerals (MULTIVITAMIN WITH MINERALS) tablet Take 1 tablet by mouth daily.     nitroGLYCERIN (NITROSTAT) 0.4 MG SL tablet PLACE 1 TABLET UNDER THE TONGUE EVERY 5 MINUTES AS  NEEDED FOR CHEST PAIN 100 tablet 0   NON FORMULARY at bedtime. CPAP     rosuvastatin (CRESTOR) 20 MG tablet Take 1 tablet (20 mg total) by mouth daily. (Patient taking differently: Take 20 mg by mouth at bedtime.) 90 tablet 3   silodosin (RAPAFLO) 8 MG CAPS capsule Take 1 capsule (8 mg total) by mouth 2 (two) times daily. (Patient taking differently: Take 8 mg by mouth daily with breakfast.) 30 capsule 11   testosterone cypionate (DEPOTESTOSTERONE CYPIONATE) 200 MG/ML injection Inject 0.5 mLs (100 mg total) into the muscle every 7 (seven) days. 5 mL 3   alfuzosin (UROXATRAL) 10 MG 24 hr tablet Take 1 tablet (10 mg total) by mouth in the morning and at bedtime. 60 tablet 11   ciprofloxacin (CIPRO) 500 MG tablet Take 1 tablet (500 mg total) by mouth every 12 (twelve) hours. (Patient not taking: Reported on 10/16/2022) 14 tablet 0   Semaglutide, 2 MG/DOSE, (OZEMPIC, 2 MG/DOSE,) 8 MG/3ML SOPN Inject 2 mg into the skin once a week. (Patient not taking: Reported on 10/16/2022)     traMADol (ULTRAM) 50 MG tablet Take 1 tablet (50 mg total) by mouth every 6 (six) hours as needed. (Patient not taking: Reported on 10/16/2022) 15 tablet 0   No current facility-administered medications for this visit.    Allergies: No Known Allergies  Past Medical History:  Diagnosis Date   CAD (coronary artery disease)  10/2021   moderate disease in left circumflex artery,   Chest pain, atypical    Erectile dysfunction    2024   H/O echocardiogram 10/04/2011   EF >55%;mild concentric LVH; normals systolic & diastolic dysfunction; mild aortic stenosis   Heart failure with mildly reduced ejection fraction (HFmrEF) (HCC) 2023   History of nuclear stress test 10/04/2011   low risk; small amount of basal inferior bowel artifact   Hyperlipidemia    Hypertension    OSA on CPAP    OSA on CPAP    Past Surgical History:  Procedure Laterality Date   COLONOSCOPY N/A 11/14/2020   Procedure: COLONOSCOPY;  Surgeon: Franky Macho, MD;  Location: [facility];  Service: Gastroenterology;  Laterality: N/A;   CYSTOSCOPY WITH INSERTION OF UROLIFT N/A 09/30/2022   Procedure: CYSTOSCOPY WITH INSERTION OF UROLIFT;  Surgeon: Malen Gauze, MD;  Location: [facility];  Service: Urology;  Laterality: N/A;   Family History  Problem Relation Age of Onset   Heart attack Brother    Social History   Socioeconomic History   Marital status: Married    Spouse name: Stephen Walton   Number of children: 4   Years of education: 12   Highest education level: 12th grade  Occupational History   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  Vaping Use   Vaping status: Never Used  Substance and Sexual Activity   Alcohol use: Not Currently    Comment: 1-2 beers per month   Drug use: Never   Sexual activity: Not Currently  Other Topics Concern   Not on file  Social History Narrative   Not on file   Social Determinants of Health   Financial Resource Strain: Low Risk  (08/21/2022)   Overall Financial Resource Strain (CARDIA)    Difficulty of Paying Living Expenses: Not hard at all  Food Insecurity: No Food Insecurity (08/21/2022)   Hunger Vital Sign    Worried About Running Out of Food in the Last Year: Never true    Ran Out of Food in the Last Year: Never true  Transportation Needs: No Transportation Needs (08/21/2022)   PRAPARE - Administrator, Civil Service (Medical): No    Lack of Transportation (Non-Medical): No  Physical Activity: Inactive (08/21/2022)   Exercise Vital Sign    Days of Exercise per Week: 0 days    Minutes of Exercise per Session: 0 min  Stress: No Stress Concern Present (08/21/2022)   [employer] of Occupational Health - Occupational Stress Questionnaire    Feeling of Stress : Only a little  Social Connections: Socially Integrated (08/21/2022)    Social Connection and Isolation Panel [NHANES]    Frequency of Communication with Friends and Family: More than three times a week    Frequency of Social Gatherings with Friends and Family: More than three times a week    Attends Religious Services: More than 4 times per year    Active Member of [employer] or Organizations: Yes    Attends Banker Meetings: 1 to 4 times per year    Marital Status: Married  Catering manager Violence: Not At Risk (08/21/2022)   Humiliation, Afraid, Rape, and Kick questionnaire    Fear of Current or Ex-Partner: No    Emotionally Abused: No  Physically Abused: No    Sexually Abused: No    Review of Systems Constitutional: Patient denies any unintentional weight loss or change in strength lntegumentary: Patient denies any rashes or pruritus Cardiovascular: Patient denies chest pain or syncope Respiratory: Patient denies shortness of breath Gastrointestinal: Patient denies nausea, vomiting, constipation, or diarrhea Musculoskeletal: Patient denies muscle cramps or weakness Neurologic: Patient denies convulsions or seizures Psychiatric: Patient denies memory problems Allergic/Immunologic: Patient denies recent allergic reaction(s) Hematologic/Lymphatic: Patient denies bleeding tendencies Endocrine: Patient denies heat/cold intolerance  GU: As per HPI.  OBJECTIVE Vitals:   10/16/22 1139  BP: 132/78  Pulse: 87  Temp: 98.2 F (36.8 C)   There is no height or weight on file to calculate BMI.  Physical Examination  Constitutional: No obvious distress; patient is non-toxic appearing  Cardiovascular: No visible lower extremity edema.  Respiratory: The patient does not have audible wheezing/stridor; respirations do not appear labored  Gastrointestinal: Abdomen non-distended Musculoskeletal: Normal ROM of UEs  Skin: No obvious rashes/open sores  Neurologic: CN 2-12 grossly intact Psychiatric: Answered questions appropriately with normal  affect  Hematologic/Lymphatic/Immunologic: No obvious bruises or sites of spontaneous bleeding  UA: positive for 3-10 RBC/hpf PVR: 14 ml  ASSESSMENT Benign prostatic hyperplasia without lower urinary tract symptoms - Plan: Urinalysis, Routine w reflex microscopic, BLADDER SCAN AMB NON-IMAGING, CANCELED: PR COMPLEX UROFLOMETRY  Prostate cancer (HCC) - Plan: Urinalysis, Routine w reflex microscopic, BLADDER SCAN AMB NON-IMAGING, CANCELED: PR COMPLEX UROFLOMETRY  Postop check - Plan: Urinalysis, Routine w reflex microscopic, BLADDER SCAN AMB NON-IMAGING, CANCELED: PR COMPLEX UROFLOMETRY  Urinary retention - Plan: Urinalysis, Routine w reflex microscopic, BLADDER SCAN AMB NON-IMAGING, CANCELED: PR COMPLEX UROFLOMETRY  He is doing well. Will continue Rapaflo and Proscar. Advised by Dr. Ronne Binning to avoid scuba diving for the next 3 months then follow up with PSA check. Pt verbalized understanding and agreement. All questions were answered.  PLAN Advised the following: 1. Continue Rapaflo and Proscar.  2. Return in about 3 months (around 01/16/2023) for f/u with Dr. Ronne Binning with PSA prior.  Orders Placed This Encounter  Procedures   Urinalysis, Routine w reflex microscopic   BLADDER SCAN AMB NON-IMAGING    It has been explained that the patient is to follow regularly with their PCP in addition to all other providers involved in their care and to follow instructions provided by these respective offices. Patient advised to contact urology clinic if any urologic-pertaining questions, concerns, new symptoms or problems arise in the interim period.  There are no Patient Instructions on file for this visit.  Electronically signed by:  Donnita Falls, FNP   10/16/22    12:04 PM

## 2022-10-16 ENCOUNTER — Ambulatory Visit (INDEPENDENT_AMBULATORY_CARE_PROVIDER_SITE_OTHER): Payer: Medicare Other | Admitting: Urology

## 2022-10-16 ENCOUNTER — Encounter: Payer: Self-pay | Admitting: Urology

## 2022-10-16 VITALS — BP 132/78 | HR 87 | Temp 98.2°F

## 2022-10-16 DIAGNOSIS — R339 Retention of urine, unspecified: Secondary | ICD-10-CM

## 2022-10-16 DIAGNOSIS — N400 Enlarged prostate without lower urinary tract symptoms: Secondary | ICD-10-CM

## 2022-10-16 DIAGNOSIS — C61 Malignant neoplasm of prostate: Secondary | ICD-10-CM

## 2022-10-16 DIAGNOSIS — Z09 Encounter for follow-up examination after completed treatment for conditions other than malignant neoplasm: Secondary | ICD-10-CM

## 2022-10-16 LAB — URINALYSIS, ROUTINE W REFLEX MICROSCOPIC
Bilirubin, UA: NEGATIVE
Glucose, UA: NEGATIVE
Ketones, UA: NEGATIVE
Leukocytes,UA: NEGATIVE
Nitrite, UA: NEGATIVE
Specific Gravity, UA: 1.03 (ref 1.005–1.030)
Urobilinogen, Ur: 0.2 mg/dL (ref 0.2–1.0)
pH, UA: 5.5 (ref 5.0–7.5)

## 2022-10-16 LAB — MICROSCOPIC EXAMINATION: Bacteria, UA: NONE SEEN

## 2022-11-02 ENCOUNTER — Other Ambulatory Visit: Payer: Self-pay | Admitting: Cardiovascular Disease

## 2022-11-21 NOTE — Progress Notes (Signed)
Called and made patient aware of ua results.  UA results reviewed with MD No txt is needed at this time.  Patient states he just wanted to rule out infection.

## 2022-12-05 ENCOUNTER — Encounter: Payer: Self-pay | Admitting: Physician Assistant

## 2022-12-05 ENCOUNTER — Ambulatory Visit: Payer: Medicare Other | Attending: Physician Assistant | Admitting: Physician Assistant

## 2022-12-05 VITALS — BP 126/68 | Ht 69.0 in | Wt 249.0 lb

## 2022-12-05 DIAGNOSIS — I10 Essential (primary) hypertension: Secondary | ICD-10-CM | POA: Diagnosis present

## 2022-12-05 DIAGNOSIS — R739 Hyperglycemia, unspecified: Secondary | ICD-10-CM | POA: Diagnosis not present

## 2022-12-05 DIAGNOSIS — E875 Hyperkalemia: Secondary | ICD-10-CM | POA: Diagnosis present

## 2022-12-05 DIAGNOSIS — I2510 Atherosclerotic heart disease of native coronary artery without angina pectoris: Secondary | ICD-10-CM

## 2022-12-05 DIAGNOSIS — I5022 Chronic systolic (congestive) heart failure: Secondary | ICD-10-CM

## 2022-12-05 NOTE — Patient Instructions (Addendum)
Medication Instructions:  No changes *If you need a refill on your cardiac medications before your next appointment, please call your pharmacy*   Lab Work: BMP in a few weeks If you have labs (blood work) drawn today and your tests are completely normal, you will receive your results only by: MyChart Message (if you have MyChart) OR A paper copy in the mail If you have any lab test that is abnormal or we need to change your treatment, we will call you to review the results.   Testing/Procedures: No testing   Follow-Up: At [facility], you and your health needs are our priority.  As part of our continuing mission to provide you with exceptional heart care, we have created designated Provider Care Teams.  These Care Teams include your primary Cardiologist (physician) and Advanced Practice Providers (APPs -  Physician Assistants and Nurse Practitioners) who all work together to provide you with the care you need, when you need it.   Your next appointment:   6 month(s)  Provider:   Nicki Guadalajara, MD

## 2022-12-05 NOTE — Progress Notes (Signed)
hyperkalemia, we plan to obtain a repeat basic metabolic panel.  Overall he is doing well and can follow-up in 6 months.  ROS:   He denies chest pain, palpitations, dyspnea, pnd, orthopnea, n, v, dizziness, syncope, edema, weight gain, or early satiety. All other systems reviewed and are otherwise negative except as noted above.    Studies Reviewed: .        Cardiac Studies & Procedures     STRESS TESTS  NM MYOCAR MULTI W/SPECT W 10/04/2011    ECHOCARDIOGRAM  ECHOCARDIOGRAM COMPLETE 09/04/2021  Narrative ECHOCARDIOGRAM REPORT    Patient Name:   Stephen Walton Date of Exam: 09/04/2021 Medical Rec #:  [id number]         Height:       69.0 in Accession #:    [id number]        Weight:       247.8 lb Date of Birth:  1955/07/08         BSA:          2.263 m Patient Age:    66 years          BP:           140/85 mmHg Patient Gender: M                 HR:           76 bpm. Exam Location:  Outpatient  Procedure: 2D Echo, Strain Analysis, Cardiac Doppler and Color Doppler  Indications:    Precordial pain  History:        Patient has prior history of Echocardiogram examinations. Risk Factors:Former Smoker and Dyslipidemia. Prostate cancer.  Sonographer:    Jeryl Columbia RDCS Referring Phys: [id number] Mohammedali Bedoy  IMPRESSIONS   1. Left ventricular ejection fraction, by estimation, is 40 to 45%. The left ventricle has mildly decreased function. The left ventricle has no regional wall motion abnormalities. There is mild left ventricular hypertrophy. Left ventricular diastolic parameters were normal. The average left ventricular global longitudinal strain is -13.7 %. The global longitudinal strain is abnormal. 2. Right ventricular systolic function is normal. The right ventricular size is normal. 3. The mitral valve is normal in structure. Trivial mitral valve regurgitation. 4. The aortic valve is normal in structure. Aortic valve regurgitation is not visualized. No aortic stenosis is present. 5. The inferior vena cava is normal in size with greater than 50% respiratory variability, suggesting right atrial pressure of 3 mmHg.  FINDINGS Left Ventricle: Left ventricular ejection fraction, by estimation, is 40 to 45%. The left ventricle has mildly decreased function. The left ventricle has no regional wall motion abnormalities. The average left ventricular global longitudinal strain is -13.7 %. The global longitudinal strain is  abnormal. The left ventricular internal cavity size was normal in size. There is mild left ventricular hypertrophy. Left ventricular diastolic parameters were normal.  Right Ventricle: The right ventricular size is normal. Right vetricular wall thickness was not well visualized. Right ventricular systolic function is normal.  Left Atrium: Left atrial size was normal in size.  Right Atrium: Right atrial size was normal in size.  Pericardium: There is no evidence of pericardial effusion.  Mitral Valve: The mitral valve is normal in structure. Trivial mitral valve regurgitation.  Tricuspid Valve: The tricuspid valve is normal in structure. Tricuspid valve regurgitation is trivial.  Aortic Valve: The aortic valve is normal in structure. Aortic valve regurgitation is not visualized. No aortic stenosis is present.  Pulmonic Valve: The  Cardiology Office Note:  .   Date:  12/05/2022  ID:  Stephen Walton, DOB 07/24/1955, MRN [id number] PCP: Stephen Walton  [facility] Providers Cardiologist:  Nicki Guadalajara, MD     History of Present Illness: .   Stephen Walton is a 67 y.o. male with PMH of OSA on CPAP, obesity, hyperlipidemia, and history of chest pain.  Patient previously had chest pain shortly after his brother had cardiac arrest.  Echocardiogram obtained at the time showed moderate LVH with normal systolic and diastolic function.  Myoview obtained in July 2013 showed normal perfusion.  I saw the patient on 08/23/2021 for precordial chest pain.  He had prolonged chest pain 4 days prior to the office visit that lasted entire day.  We obtained a coronary CT that showed mild CAD.  Echocardiogram however showed EF was borderline low.  I discussed with the patient regarding medical therapy versus definitive cardiac catheterization.  We eventually decided on a trial of medical therapy first.  I started him on carvedilol and Entresto.  Due to inability to get medication assistance, he was eventually switched to valsartan.  Patient was admitted to the hospital on 10/20/2021 and was seen by Dr. Jacques Navy who has independently reviewed the previous coronary CT and echocardiogram.  It was felt that her EF was low normal at 50 to 55%.  It was also felt the patient likely has a at least moderate disease in left circumflex artery, however FFR was negative.  Dr. Jacques Navy suspected his chest pain was more related to blood pressure spikes.  Imdur was added during this hospitalization.  However if the patient continues to have chest pain, it was recommended to consider coronary angiography with PCI.  More recently, patient was readmitted to the hospital in November 2023 with nocturnal chest pain with markedly elevated blood pressure of 210/111.  Chest pain responded to nitroglycerin.  He was off of Imdur for several months.  Blood pressure  quickly normalized in the hospital.  Troponin was negative.  Carvedilol was increased to 6.25 mg twice a day.  Valsartan was discontinued.  Previous Imdur was not restarted due to headache and dizziness.  Since discharge, patient was seen by Dr. Tresa Endo on 02/12/2022 who added amlodipine 5 mg daily to his medical regimen.  He called our office on 12/15 to report palpitation, he was seen to be instructed to reduce amlodipine to 2.5 mg every night.  Heart monitor obtained in April 2024 showed minimal heart rate 47, maximal heart rate 182, average heart rate 75 bpm.  He was seen for chest discomfort in March 2024, symptoms did not occur with exertion.  Ranexa was added, however he never started on this medication.  His symptom improved with PPI.  Predominant rhythm was on sinus rhythm with first-degree AV block, 1 run of SVT lasting only 4 beats.  He was last seen by Carlos Levering NP on 08/16/2022, prior to that, he was hospitalized for urosepsis.  Heart rate was elevated, therefore he was given metoprolol.  Due to side effect of erectile dysfunction, he was eventually switched to Cardizem.  He underwent a cystoscopy with implantation of UroLift device in July 2024.  He was seen in the ED again on 10/05/2022 due to hematuria.   Patient presents today for follow-up.  He denies exertional chest pain or shortness of breath.  He is previous blood work in July showed mildly elevated potassium level.  He is not on any medication that can cause  hyperkalemia, we plan to obtain a repeat basic metabolic panel.  Overall he is doing well and can follow-up in 6 months.  ROS:   He denies chest pain, palpitations, dyspnea, pnd, orthopnea, n, v, dizziness, syncope, edema, weight gain, or early satiety. All other systems reviewed and are otherwise negative except as noted above.    Studies Reviewed: .        Cardiac Studies & Procedures     STRESS TESTS  NM MYOCAR MULTI W/SPECT W 10/04/2011    ECHOCARDIOGRAM  ECHOCARDIOGRAM COMPLETE 09/04/2021  Narrative ECHOCARDIOGRAM REPORT    Patient Name:   Stephen Walton Date of Exam: 09/04/2021 Medical Rec #:  [id number]         Height:       69.0 in Accession #:    [id number]        Weight:       247.8 lb Date of Birth:  1955/07/08         BSA:          2.263 m Patient Age:    66 years          BP:           140/85 mmHg Patient Gender: M                 HR:           76 bpm. Exam Location:  Outpatient  Procedure: 2D Echo, Strain Analysis, Cardiac Doppler and Color Doppler  Indications:    Precordial pain  History:        Patient has prior history of Echocardiogram examinations. Risk Factors:Former Smoker and Dyslipidemia. Prostate cancer.  Sonographer:    Jeryl Columbia RDCS Referring Phys: [id number] Mohammedali Bedoy  IMPRESSIONS   1. Left ventricular ejection fraction, by estimation, is 40 to 45%. The left ventricle has mildly decreased function. The left ventricle has no regional wall motion abnormalities. There is mild left ventricular hypertrophy. Left ventricular diastolic parameters were normal. The average left ventricular global longitudinal strain is -13.7 %. The global longitudinal strain is abnormal. 2. Right ventricular systolic function is normal. The right ventricular size is normal. 3. The mitral valve is normal in structure. Trivial mitral valve regurgitation. 4. The aortic valve is normal in structure. Aortic valve regurgitation is not visualized. No aortic stenosis is present. 5. The inferior vena cava is normal in size with greater than 50% respiratory variability, suggesting right atrial pressure of 3 mmHg.  FINDINGS Left Ventricle: Left ventricular ejection fraction, by estimation, is 40 to 45%. The left ventricle has mildly decreased function. The left ventricle has no regional wall motion abnormalities. The average left ventricular global longitudinal strain is -13.7 %. The global longitudinal strain is  abnormal. The left ventricular internal cavity size was normal in size. There is mild left ventricular hypertrophy. Left ventricular diastolic parameters were normal.  Right Ventricle: The right ventricular size is normal. Right vetricular wall thickness was not well visualized. Right ventricular systolic function is normal.  Left Atrium: Left atrial size was normal in size.  Right Atrium: Right atrial size was normal in size.  Pericardium: There is no evidence of pericardial effusion.  Mitral Valve: The mitral valve is normal in structure. Trivial mitral valve regurgitation.  Tricuspid Valve: The tricuspid valve is normal in structure. Tricuspid valve regurgitation is trivial.  Aortic Valve: The aortic valve is normal in structure. Aortic valve regurgitation is not visualized. No aortic stenosis is present.  Pulmonic Valve: The  Cardiology Office Note:  .   Date:  12/05/2022  ID:  Stephen Walton, DOB 07/24/1955, MRN [id number] PCP: Stephen Walton  [facility] Providers Cardiologist:  Nicki Guadalajara, MD     History of Present Illness: .   Stephen Walton is a 67 y.o. male with PMH of OSA on CPAP, obesity, hyperlipidemia, and history of chest pain.  Patient previously had chest pain shortly after his brother had cardiac arrest.  Echocardiogram obtained at the time showed moderate LVH with normal systolic and diastolic function.  Myoview obtained in July 2013 showed normal perfusion.  I saw the patient on 08/23/2021 for precordial chest pain.  He had prolonged chest pain 4 days prior to the office visit that lasted entire day.  We obtained a coronary CT that showed mild CAD.  Echocardiogram however showed EF was borderline low.  I discussed with the patient regarding medical therapy versus definitive cardiac catheterization.  We eventually decided on a trial of medical therapy first.  I started him on carvedilol and Entresto.  Due to inability to get medication assistance, he was eventually switched to valsartan.  Patient was admitted to the hospital on 10/20/2021 and was seen by Dr. Jacques Navy who has independently reviewed the previous coronary CT and echocardiogram.  It was felt that her EF was low normal at 50 to 55%.  It was also felt the patient likely has a at least moderate disease in left circumflex artery, however FFR was negative.  Dr. Jacques Navy suspected his chest pain was more related to blood pressure spikes.  Imdur was added during this hospitalization.  However if the patient continues to have chest pain, it was recommended to consider coronary angiography with PCI.  More recently, patient was readmitted to the hospital in November 2023 with nocturnal chest pain with markedly elevated blood pressure of 210/111.  Chest pain responded to nitroglycerin.  He was off of Imdur for several months.  Blood pressure  quickly normalized in the hospital.  Troponin was negative.  Carvedilol was increased to 6.25 mg twice a day.  Valsartan was discontinued.  Previous Imdur was not restarted due to headache and dizziness.  Since discharge, patient was seen by Dr. Tresa Endo on 02/12/2022 who added amlodipine 5 mg daily to his medical regimen.  He called our office on 12/15 to report palpitation, he was seen to be instructed to reduce amlodipine to 2.5 mg every night.  Heart monitor obtained in April 2024 showed minimal heart rate 47, maximal heart rate 182, average heart rate 75 bpm.  He was seen for chest discomfort in March 2024, symptoms did not occur with exertion.  Ranexa was added, however he never started on this medication.  His symptom improved with PPI.  Predominant rhythm was on sinus rhythm with first-degree AV block, 1 run of SVT lasting only 4 beats.  He was last seen by Carlos Levering NP on 08/16/2022, prior to that, he was hospitalized for urosepsis.  Heart rate was elevated, therefore he was given metoprolol.  Due to side effect of erectile dysfunction, he was eventually switched to Cardizem.  He underwent a cystoscopy with implantation of UroLift device in July 2024.  He was seen in the ED again on 10/05/2022 due to hematuria.   Patient presents today for follow-up.  He denies exertional chest pain or shortness of breath.  He is previous blood work in July showed mildly elevated potassium level.  He is not on any medication that can cause  Cardiology Office Note:  .   Date:  12/05/2022  ID:  Stephen Walton, DOB 07/24/1955, MRN [id number] PCP: Stephen Walton  [facility] Providers Cardiologist:  Nicki Guadalajara, MD     History of Present Illness: .   Stephen Walton is a 67 y.o. male with PMH of OSA on CPAP, obesity, hyperlipidemia, and history of chest pain.  Patient previously had chest pain shortly after his brother had cardiac arrest.  Echocardiogram obtained at the time showed moderate LVH with normal systolic and diastolic function.  Myoview obtained in July 2013 showed normal perfusion.  I saw the patient on 08/23/2021 for precordial chest pain.  He had prolonged chest pain 4 days prior to the office visit that lasted entire day.  We obtained a coronary CT that showed mild CAD.  Echocardiogram however showed EF was borderline low.  I discussed with the patient regarding medical therapy versus definitive cardiac catheterization.  We eventually decided on a trial of medical therapy first.  I started him on carvedilol and Entresto.  Due to inability to get medication assistance, he was eventually switched to valsartan.  Patient was admitted to the hospital on 10/20/2021 and was seen by Dr. Jacques Navy who has independently reviewed the previous coronary CT and echocardiogram.  It was felt that her EF was low normal at 50 to 55%.  It was also felt the patient likely has a at least moderate disease in left circumflex artery, however FFR was negative.  Dr. Jacques Navy suspected his chest pain was more related to blood pressure spikes.  Imdur was added during this hospitalization.  However if the patient continues to have chest pain, it was recommended to consider coronary angiography with PCI.  More recently, patient was readmitted to the hospital in November 2023 with nocturnal chest pain with markedly elevated blood pressure of 210/111.  Chest pain responded to nitroglycerin.  He was off of Imdur for several months.  Blood pressure  quickly normalized in the hospital.  Troponin was negative.  Carvedilol was increased to 6.25 mg twice a day.  Valsartan was discontinued.  Previous Imdur was not restarted due to headache and dizziness.  Since discharge, patient was seen by Dr. Tresa Endo on 02/12/2022 who added amlodipine 5 mg daily to his medical regimen.  He called our office on 12/15 to report palpitation, he was seen to be instructed to reduce amlodipine to 2.5 mg every night.  Heart monitor obtained in April 2024 showed minimal heart rate 47, maximal heart rate 182, average heart rate 75 bpm.  He was seen for chest discomfort in March 2024, symptoms did not occur with exertion.  Ranexa was added, however he never started on this medication.  His symptom improved with PPI.  Predominant rhythm was on sinus rhythm with first-degree AV block, 1 run of SVT lasting only 4 beats.  He was last seen by Carlos Levering NP on 08/16/2022, prior to that, he was hospitalized for urosepsis.  Heart rate was elevated, therefore he was given metoprolol.  Due to side effect of erectile dysfunction, he was eventually switched to Cardizem.  He underwent a cystoscopy with implantation of UroLift device in July 2024.  He was seen in the ED again on 10/05/2022 due to hematuria.   Patient presents today for follow-up.  He denies exertional chest pain or shortness of breath.  He is previous blood work in July showed mildly elevated potassium level.  He is not on any medication that can cause

## 2022-12-16 ENCOUNTER — Other Ambulatory Visit: Payer: Self-pay

## 2022-12-16 DIAGNOSIS — R739 Hyperglycemia, unspecified: Secondary | ICD-10-CM

## 2022-12-17 LAB — BASIC METABOLIC PANEL
BUN/Creatinine Ratio: 17 (ref 10–24)
BUN: 19 mg/dL (ref 8–27)
CO2: 23 mmol/L (ref 20–29)
Calcium: 9.5 mg/dL (ref 8.6–10.2)
Chloride: 102 mmol/L (ref 96–106)
Creatinine, Ser: 1.1 mg/dL (ref 0.76–1.27)
Glucose: 79 mg/dL (ref 70–99)
Potassium: 4.9 mmol/L (ref 3.5–5.2)
Sodium: 140 mmol/L (ref 134–144)
eGFR: 74 mL/min/{1.73_m2} (ref >59)

## 2022-12-19 ENCOUNTER — Telehealth: Payer: Self-pay

## 2022-12-19 NOTE — Telephone Encounter (Addendum)
Called patient regarding results, patient had understanding of results.  ----- Message from Azalee Course sent at 12/17/2022  4:39 PM EDT ----- Stable renal function and electrolyte. Previously elevated potassium level normalized

## 2022-12-23 ENCOUNTER — Other Ambulatory Visit: Payer: Self-pay | Admitting: Student

## 2023-01-17 ENCOUNTER — Ambulatory Visit (INDEPENDENT_AMBULATORY_CARE_PROVIDER_SITE_OTHER): Payer: Medicare Other | Admitting: Urology

## 2023-01-17 VITALS — BP 147/82 | HR 79

## 2023-01-17 DIAGNOSIS — C61 Malignant neoplasm of prostate: Secondary | ICD-10-CM

## 2023-01-17 DIAGNOSIS — N400 Enlarged prostate without lower urinary tract symptoms: Secondary | ICD-10-CM

## 2023-01-17 DIAGNOSIS — E291 Testicular hypofunction: Secondary | ICD-10-CM | POA: Diagnosis not present

## 2023-01-17 LAB — URINALYSIS, ROUTINE W REFLEX MICROSCOPIC
Bilirubin, UA: NEGATIVE
Glucose, UA: NEGATIVE
Ketones, UA: NEGATIVE
Leukocytes,UA: NEGATIVE
Nitrite, UA: NEGATIVE
RBC, UA: NEGATIVE
Specific Gravity, UA: 1.015 (ref 1.005–1.030)
Urobilinogen, Ur: 0.2 mg/dL (ref 0.2–1.0)
pH, UA: 7 (ref 5.0–7.5)

## 2023-01-17 LAB — MICROSCOPIC EXAMINATION: Bacteria, UA: NONE SEEN

## 2023-01-17 MED ORDER — TESTOSTERONE CYPIONATE 200 MG/ML IM SOLN: 100.0000 mg | INTRAMUSCULAR | 3 refills | Status: DC

## 2023-01-17 NOTE — Progress Notes (Unsigned)
01/17/2023 12:34 PM   CARMIN DIBARTOLO Feb 28, 1956 [id number]  Referring provider: Avis Epley, PA-C [address] [place],  [place] [address]  Followup BPH and urinary retention   HPI: Mr Samad is a 67yo here for followup for BPH and hypogonadism. IPSS 5 QOL 1 after Urolift. Uirne stream strong. No straining to urinate. Nocturia 1-2x. He has stopped his testosterone replacement therapy prior to Urolift surgery and wishes to restart therapy.    PMH: Past Medical History:  Diagnosis Date   CAD (coronary artery disease) 10/2021   moderate disease in left circumflex artery,   Chest pain, atypical    Erectile dysfunction    2024   H/O echocardiogram 10/04/2011   EF >55%;mild concentric LVH; normals systolic & diastolic dysfunction; mild aortic stenosis   Heart failure with mildly reduced ejection fraction (HFmrEF) (HCC) 2023   History of nuclear stress test 10/04/2011   low risk; small amount of basal inferior bowel artifact   Hyperlipidemia    Hypertension    OSA on CPAP    OSA on CPAP     Surgical History: Past Surgical History:  Procedure Laterality Date   COLONOSCOPY N/A 11/14/2020   Procedure: COLONOSCOPY;  Surgeon: Franky Macho, MD;  Location: [facility];  Service: Gastroenterology;  Laterality: N/A;   CYSTOSCOPY WITH INSERTION OF UROLIFT N/A 09/30/2022   Procedure: CYSTOSCOPY WITH INSERTION OF UROLIFT;  Surgeon: Malen Gauze, MD;  Location: [facility];  Service: Urology;  Laterality: N/A;    Home Medications:  Allergies as of 01/17/2023   No Known Allergies      Medication List        Accurate as of January 17, 2023 12:34 PM. If you have any questions, ask your nurse or doctor.          aspirin 81 MG tablet Take 81 mg by mouth daily.   buPROPion 300 MG 24 hr tablet Commonly known as: WELLBUTRIN XL Take 300 mg by mouth at bedtime.   busPIRone 15 MG tablet Commonly known as: BUSPAR Take 15 mg by mouth at bedtime.    cyanocobalamin 1000 MCG tablet Commonly known as: VITAMIN B12 Take 1,000 mcg by mouth daily.   diltiazem 120 MG 24 hr capsule Commonly known as: CARDIZEM CD TAKE 1 CAPSULE(120 MG) BY MOUTH DAILY   dutasteride 0.5 MG capsule Commonly known as: AVODART Take 1 capsule (0.5 mg total) by mouth daily.   fish oil-omega-3 fatty acids 1000 MG capsule Take 1 g by mouth daily.   hydrALAZINE 25 MG tablet Commonly known as: APRESOLINE Take 1 tablet (25 mg total) by mouth 2 (two) times daily.   IRON PO Take 18 mg by mouth daily.   multivitamin with minerals tablet Take 1 tablet by mouth daily.   nitroGLYCERIN 0.4 MG SL tablet Commonly known as: NITROSTAT PLACE 1 TABLET UNDER THE TONGUE EVERY 5 MINUTES AS NEEDED FOR CHEST PAIN   NON FORMULARY at bedtime. CPAP   rosuvastatin 20 MG tablet Commonly known as: CRESTOR TAKE 1 TABLET(20 MG) BY MOUTH DAILY   silodosin 8 MG Caps capsule Commonly known as: RAPAFLO Take 1 capsule (8 mg total) by mouth 2 (two) times daily. What changed: when to take this   testosterone cypionate 200 MG/ML injection Commonly known as: DEPOTESTOSTERONE CYPIONATE Inject 0.5 mLs (100 mg total) into the muscle every 7 (seven) days.        Allergies: No Known Allergies  Family History: Family History  Problem Relation Age of Onset  Heart attack Brother     Social History:  reports that he has quit smoking. He has been exposed to tobacco smoke. He has never used smokeless tobacco. He reports that he does not currently use alcohol. He reports that he does not use drugs.  ROS: All other review of systems were reviewed and are negative except what is noted above in HPI  Physical Exam: BP (!) 147/82   Pulse 79   Constitutional:  Alert and oriented, No acute distress. HEENT: [place] AT, moist mucus membranes.  Trachea midline, no masses. Cardiovascular: No clubbing, cyanosis, or edema. Respiratory: Normal respiratory effort, no increased work of  breathing. GI: Abdomen is soft, nontender, nondistended, no abdominal masses GU: No CVA tenderness.  Lymph: No cervical or inguinal lymphadenopathy. Skin: No rashes, bruises or suspicious lesions. Neurologic: Grossly intact, no focal deficits, moving all 4 extremities. Psychiatric: Normal mood and affect.  Laboratory Data: Lab Results  Component Value Date   WBC 10.5 10/05/2022   HGB 15.4 10/05/2022   HCT 48.3 10/05/2022   MCV 84.6 10/05/2022   PLT 303 10/05/2022    Lab Results  Component Value Date   CREATININE 1.10 12/16/2022    Lab Results  Component Value Date   PSA 4.0 10/21/2019   PSA 3.9 03/22/2019    Lab Results  Component Value Date   TESTOSTERONE 528 07/04/2022    No results found for: "HGBA1C"  Urinalysis    Component Value Date/Time   COLORURINE YELLOW 10/05/2022 1500   APPEARANCEUR Clear 10/16/2022 1132   LABSPEC 1.013 10/05/2022 1500   PHURINE 6.0 10/05/2022 1500   GLUCOSEU Negative 10/16/2022 1132   HGBUR LARGE (A) 10/05/2022 1500   BILIRUBINUR Negative 10/16/2022 1132   KETONESUR NEGATIVE 10/05/2022 1500   PROTEINUR 2+ (A) 10/16/2022 1132   PROTEINUR 100 (A) 10/05/2022 1500   UROBILINOGEN 1.0 10/29/2021 1542   NITRITE Negative 10/16/2022 1132   NITRITE NEGATIVE 10/05/2022 1500   LEUKOCYTESUR Negative 10/16/2022 1132   LEUKOCYTESUR TRACE (A) 10/05/2022 1500    Lab Results  Component Value Date   LABMICR See below: 10/16/2022   WBCUA 0-5 10/16/2022   LABEPIT 0-10 10/16/2022   MUCUS Present (A) 10/16/2022   BACTERIA None seen 10/16/2022    Pertinent Imaging:  No results found for this or any previous visit.  No results found for this or any previous visit.  No results found for this or any previous visit.  No results found for this or any previous visit.  Results for orders placed during the hospital encounter of 04/24/15  US Renal  Narrative CLINICAL DATA:  Proteinuria  EXAM: RENAL / URINARY TRACT ULTRASOUND  COMPLETE  COMPARISON:  None.  FINDINGS: Right Kidney:  Length: 12.7 cm. Echogenicity within normal limits. No mass or hydronephrosis visualized.  Left Kidney:  Length: 12.0 cm. Echogenicity within normal limits. No mass or hydronephrosis visualized.  Bladder:  Appears normal for degree of bladder distention.  IMPRESSION: Normal renal ultrasound.   Electronically Signed By: Marlan Palau M.D. On: 04/24/2015 09:56  No valid procedures specified. No results found for this or any previous visit.  No results found for this or any previous visit.   Assessment & Plan:    1. Benign prostatic hyperplasia without lower urinary tract symptoms We silodosin and avodart - Urinalysis, Routine w reflex microscopic  2. Prostate cancer (HCC) Followupo 6 months with PSA  3. Hypogonadism male Followup 6 months with testosterone labs -restart 100mg  IM testosterone every 7 days  No follow-ups on file.  Wilkie Aye, MD  [facility] [place]  new

## 2023-01-18 LAB — PSA, TOTAL AND FREE
PSA, Free Pct: 18.5 %
PSA, Free: 0.63 ng/mL
Prostate Specific Ag, Serum: 3.4 ng/mL (ref 0.0–4.0)

## 2023-01-21 ENCOUNTER — Encounter: Payer: Self-pay | Admitting: Urology

## 2023-01-21 NOTE — Patient Instructions (Signed)

## 2023-04-09 ENCOUNTER — Telehealth: Payer: Self-pay | Admitting: Urology

## 2023-04-09 NOTE — Telephone Encounter (Signed)
Patient called again, he would like to know by end of day, if the treatment he is out is correct.

## 2023-04-09 NOTE — Telephone Encounter (Signed)
Patient was seen in ED for kidney infection , he had a UROlift a few months ago and he had a episode Monday where he could not urinate.  Would a kidney infection cause him not to be able to urinate or does he need to get an appt?  He is currently taking Augmentin for the next 20 days.

## 2023-06-03 ENCOUNTER — Telehealth: Payer: Self-pay | Admitting: Cardiovascular Disease

## 2023-06-03 NOTE — Telephone Encounter (Signed)
 Spoke to patient he stated he has had 3 episodes of chest pain since yesterday morning.All 3 episodes relieved by NTG x 1.B/P has been elevated 160/90,140/90.At present 164/80.He is anxious.No chest pain at present.Appointment scheduled with Gillian Shields NP 3/24 at 2:45 pm at [facility] office.Directions given.Advised if he has any more chest pain he will need to go to ED.

## 2023-06-03 NOTE — Telephone Encounter (Signed)
 Pt c/o BP issue: STAT if pt c/o blurred vision, one-sided weakness or slurred speech  1. What are your last 5 BP readings? 160/100 BP yesterday around 3pm, 156/80 last night   2. Are you having any other symptoms (ex. Dizziness, headache, blurred vision, passed out)?  Right now no but there was some Tightness in his chest when it was very high but he took nitroglycerin and it went down to 130/80 for 160/100 and relieved the chest pain.   3. What is your BP issue? Pt is very concerned about his bp being up and down, him having to take multiple nitroglycerin and him having   Pt c/o of Chest Pain: STAT if active (IN THIS MOMENT) CP, including tightness, pressure, jaw pain, shoulder/upper arm/back pain, SOB, nausea, and vomiting.  1. Are you having CP right now (tightness, pressure, or discomfort)? No  2. Are you experiencing any other symptoms (ex. SOB, nausea, vomiting, sweating)? No  3. How long have you been experiencing CP? Yesterday around 3pm  4. Is your CP continuous or coming and going? Coming and going  5. Have you taken Nitroglycerin? yes  6. If CP returns before callback, please consider calling 911. ?

## 2023-06-09 ENCOUNTER — Encounter (HOSPITAL_BASED_OUTPATIENT_CLINIC_OR_DEPARTMENT_OTHER): Payer: Self-pay | Admitting: Family

## 2023-06-09 ENCOUNTER — Ambulatory Visit (HOSPITAL_BASED_OUTPATIENT_CLINIC_OR_DEPARTMENT_OTHER): Admitting: Family

## 2023-06-09 VITALS — BP 146/90 | HR 72 | Ht 69.0 in | Wt 251.7 lb

## 2023-06-09 DIAGNOSIS — I10 Essential (primary) hypertension: Secondary | ICD-10-CM | POA: Diagnosis not present

## 2023-06-09 DIAGNOSIS — R072 Precordial pain: Secondary | ICD-10-CM

## 2023-06-09 DIAGNOSIS — R0989 Other specified symptoms and signs involving the circulatory and respiratory systems: Secondary | ICD-10-CM

## 2023-06-09 DIAGNOSIS — E785 Hyperlipidemia, unspecified: Secondary | ICD-10-CM | POA: Diagnosis not present

## 2023-06-09 DIAGNOSIS — I25118 Atherosclerotic heart disease of native coronary artery with other forms of angina pectoris: Secondary | ICD-10-CM | POA: Diagnosis not present

## 2023-06-09 MED ORDER — NEBIVOLOL HCL 5 MG PO TABS: 5.0000 mg | ORAL_TABLET | Freq: Every day | ORAL | 2 refills | Status: DC

## 2023-06-09 MED ORDER — HYDRALAZINE HCL 25 MG PO TABS: 25.0000 mg | ORAL_TABLET | ORAL | 1 refills | Status: AC | PRN

## 2023-06-09 NOTE — Patient Instructions (Signed)
 Medication Instructions:  Your physician has recommended you make the following change in your medication:   Change: Hydralazine as needed for systolic BP > 180   Start: Nebivolol 5mg  daily   *If you need a refill on your cardiac medications before your next appointment, please call your pharmacy*   Lab Work: BMP today    Testing/Procedures: Your cardiac CT will be scheduled at one of the below locations:   [facility] [address] [place], [place] [address] [phone]  If scheduled at [facility], please arrive at the [facility] and Children's Entrance (Entrance C2) of [facility] 30 minutes prior to test start time. You can use the FREE valet parking offered at entrance C (encouraged to control the heart rate for the test)  Proceed to the [facility] Radiology Department (first floor) to check-in and test prep.  All radiology patients and guests should use entrance C2 at [facility], accessed from [facility], even though the hospital's physical address listed is [address].     Please follow these instructions carefully (unless otherwise directed):  An IV will be required for this test and Nitroglycerin will be given.  Hold all erectile dysfunction medications at least 3 days (72 hrs) prior to test. (Ie viagra, cialis, sildenafil, tadalafil, etc)   On the Night Before the Test: Be sure to Drink plenty of water. Do not consume any caffeinated/decaffeinated beverages or chocolate 12 hours prior to your test. Do not take any antihistamines 12 hours prior to your test.  On the Day of the Test: Drink plenty of water until 1 hour prior to the test. Do not eat any food 1 hour prior to test. You may take your regular medications prior to the test.       After the Test: Drink plenty of water. After receiving IV contrast, you may experience a mild flushed feeling. This is normal. On occasion, you may experience a  mild rash up to 24 hours after the test. This is not dangerous. If this occurs, you can take Benadryl 25 mg, Zyrtec, Claritin, or Allegra and increase your fluid intake. (Patients taking Tikosyn should avoid Benadryl, and may take Zyrtec, Claritin, or Allegra) If you experience trouble breathing, this can be serious. If it is severe call 911 IMMEDIATELY. If it is mild, please call our office.  We will call to schedule your test 2-4 weeks out understanding that some insurance companies will need an authorization prior to the service being performed.   For more information and frequently asked questions, please visit our website : [email or web address]  For non-scheduling related questions, please contact the cardiac imaging nurse navigator should you have any questions/concerns: Cardiac Imaging Nurse Navigators Direct Office Dial: [phone]   For scheduling needs, including cancellations and rescheduling, please call [place], [phone].  Follow-Up: At [facility], you and your health needs are our priority.  As part of our continuing mission to provide you with exceptional heart care, we have created designated Provider Care Teams.  These Care Teams include your primary Cardiologist (physician) and Advanced Practice Providers (APPs -  Physician Assistants and Nurse Practitioners) who all work together to provide you with the care you need, when you need it.  We recommend signing up for the patient portal called "MyChart".  Sign up information is provided on this After Visit Summary.  MyChart is used to connect with patients for Virtual Visits (Telemedicine).  Patients are able to view lab/test  results, encounter notes, upcoming appointments, etc.  Non-urgent messages can be sent to your provider as well.   To learn more about what you can do with MyChart, go to [email or web address].    Your next appointment:   Follow up as scheduled

## 2023-06-09 NOTE — Progress Notes (Signed)
 Cardiology Office Note:  .   Date:  06/09/2023  ID:  Stephen Walton, DOB [birth year], MRN [id number] PCP: Ladon Applebaum  [facility] Providers Cardiologist:  Nicki Guadalajara, MD    History of Present Illness: .   Stephen Walton is a 68 y.o. male with a history of OSA on CPAP, obesity, hyperlipidemia, nonobstructive coronary artery disease, hypertension.  Prior chest pain after his brother suffered a cardiac arrest with echo at that time moderate LVH, normal systolic and diastolic function.  Myoview 09/2011 normal perfusion.  Cardiac CTA 2023 with mild coronary artery disease.  Medical therapy was pursued.  Entresto had to be changed to valsartan due to cost and coverage.  Previous issues with Imdur with headache and dizziness.  He wore a monitor 06/2022 with predominantly normal sinus rhythm average heart rate 75 bpm, with 1 4 beat run of SVT.  March 2020 for recurrent office visit with chest pain and recommended Ranexa but did not start as the symptoms improved with PPI.  Presents today after contacting the office with chest pain. A week ago woke up with chest pain at 3 am with BP 160/90 at that time. He took 1 nitroglycerin with relief of chest pain. Repeat BP 130/80. Around 5 am had repeat chest pain with BP  160/90 and had to take another nitroglycerin. At 8:45 had repeat chest pain 150/100 and took a third nitroglycerin as well as two aspirin at 9:30 BP 130/80. All three episodes were across his chest and felt like tightness. Tells me it wasn't uite pain but was discomfort. Not associated with any dyspnea, diaphoresis.   Thursday prior to this episode was getting his camper ready for a trip and wore himself out. Otherwise no significant exertion. BP prior to this episode usually 130s.   In regards to ED, previously changed from carvedilol to metoprolol without change in ED symptoms.  His ED did improve after stopping amlodipine.  However still bothersome though did self  discontinue Hydralazine with improvement in symptoms.   Previous antihypertensives Carvedilol - ED Metoprolol - ED Amlodipine - palpitations Valsartan - never started due to hyperkalemia Imdur - headache  ROS: Please see the history of present illness.    All other systems reviewed and are negative.   Studies Reviewed: .        Cardiac Studies & Procedures   ______________________________________________________________________________________________   STRESS TESTS  NM MYOCAR MULTI W/SPECT W 10/04/2011   ECHOCARDIOGRAM  ECHOCARDIOGRAM COMPLETE 09/04/2021  Narrative ECHOCARDIOGRAM REPORT    Patient Name:   Stephen Walton Date of Exam: 09/04/2021 Medical Rec #:  [id number]         Height:       69.0 in Accession #:    [id number]        Weight:       247.8 lb Date of Birth:  08/03/1955         BSA:          2.263 m Patient Age:    66 years          BP:           140/85 mmHg Patient Gender: M                 HR:           76 bpm. Exam Location:  Outpatient  Procedure: 2D Echo, Strain Analysis, Cardiac Doppler and Color Doppler  Indications:    Precordial pain  History:  Patient has prior history of Echocardiogram examinations. Risk Factors:Former Smoker and Dyslipidemia. Prostate cancer.  Sonographer:    Jeryl Columbia RDCS Referring Phys: [phone] HAO MENG  IMPRESSIONS   1. Left ventricular ejection fraction, by estimation, is 40 to 45%. The left ventricle has mildly decreased function. The left ventricle has no regional wall motion abnormalities. There is mild left ventricular hypertrophy. Left ventricular diastolic parameters were normal. The average left ventricular global longitudinal strain is -13.7 %. The global longitudinal strain is abnormal. 2. Right ventricular systolic function is normal. The right ventricular size is normal. 3. The mitral valve is normal in structure. Trivial mitral valve regurgitation. 4. The aortic valve is normal in  structure. Aortic valve regurgitation is not visualized. No aortic stenosis is present. 5. The inferior vena cava is normal in size with greater than 50% respiratory variability, suggesting right atrial pressure of 3 mmHg.  FINDINGS Left Ventricle: Left ventricular ejection fraction, by estimation, is 40 to 45%. The left ventricle has mildly decreased function. The left ventricle has no regional wall motion abnormalities. The average left ventricular global longitudinal strain is -13.7 %. The global longitudinal strain is abnormal. The left ventricular internal cavity size was normal in size. There is mild left ventricular hypertrophy. Left ventricular diastolic parameters were normal.  Right Ventricle: The right ventricular size is normal. Right vetricular wall thickness was not well visualized. Right ventricular systolic function is normal.  Left Atrium: Left atrial size was normal in size.  Right Atrium: Right atrial size was normal in size.  Pericardium: There is no evidence of pericardial effusion.  Mitral Valve: The mitral valve is normal in structure. Trivial mitral valve regurgitation.  Tricuspid Valve: The tricuspid valve is normal in structure. Tricuspid valve regurgitation is trivial.  Aortic Valve: The aortic valve is normal in structure. Aortic valve regurgitation is not visualized. No aortic stenosis is present.  Pulmonic Valve: The pulmonic valve was normal in structure. Pulmonic valve regurgitation is not visualized.  Aorta: The aortic root and ascending aorta are structurally normal, with no evidence of dilitation.  Venous: The inferior vena cava is normal in size with greater than 50% respiratory variability, suggesting right atrial pressure of 3 mmHg.  IAS/Shunts: The interatrial septum was not well visualized.   LEFT VENTRICLE PLAX 2D LVIDd:         5.01 cm      Diastology LVIDs:         3.41 cm      LV e' medial:    10.00 cm/s LV PW:         1.36 cm      LV E/e'  medial:  8.6 LV IVS:        1.06 cm      LV e' lateral:   9.46 cm/s LVOT diam:     2.10 cm      LV E/e' lateral: 9.1 LV SV:         63 LV SV Index:   28           2D Longitudinal Strain LVOT Area:     3.46 cm     2D Strain GLS Avg:     -13.7 %  LV Volumes (MOD) LV vol d, MOD A2C: 150.0 ml 3D Volume EF: LV vol d, MOD A4C: 200.0 ml 3D EF:        44 % LV vol s, MOD A2C: 81.1 ml  LV EDV:       152 ml LV  vol s, MOD A4C: 103.0 ml LV ESV:       85 ml LV SV MOD A2C:     68.9 ml  LV SV:        68 ml LV SV MOD A4C:     200.0 ml LV SV MOD BP:      81.6 ml  RIGHT VENTRICLE RV Basal diam:  3.60 cm RV Mid diam:    2.77 cm RV S prime:     12.70 cm/s TAPSE (M-mode): 2.8 cm  LEFT ATRIUM             Index        RIGHT ATRIUM           Index LA diam:        3.70 cm 1.63 cm/m   RA Area:     14.90 cm LA Vol (A2C):   62.9 ml 27.79 ml/m  RA Volume:   35.00 ml  15.47 ml/m LA Vol (A4C):   75.1 ml 33.19 ml/m LA Biplane Vol: 70.7 ml 31.24 ml/m AORTIC VALVE LVOT Vmax:   104.00 cm/s LVOT Vmean:  67.400 cm/s LVOT VTI:    0.181 m  AORTA Ao Root diam: 3.00 cm Ao Asc diam:  3.00 cm  MITRAL VALVE MV Area (PHT): 5.38 cm    SHUNTS MV Decel Time: 141 msec    Systemic VTI:  0.18 m MR Peak grad: 48.2 mmHg    Systemic Diam: 2.10 cm MR Vmax:      347.00 cm/s MV E velocity: 86.50 cm/s MV A velocity: 80.70 cm/s MV E/A ratio:  1.07  Kristeen Miss MD Electronically signed by Kristeen Miss MD Signature Date/Time: 09/04/2021/5:32:59 PM    Final    MONITORS  LONG TERM MONITOR (3-14 DAYS) 06/20/2022  Narrative Patch Wear Time:  6 days and 23 hours (2024-03-23T11:22:59-0400 to 2024-03-30T11:18:02-399)  Patient had a min HR of 47 bpm, max HR of 182 bpm, and avg HR of 75 bpm. Predominant underlying rhythm was Sinus Rhythm. First Degree AV Block was present. 1 run of Supraventricular Tachycardia occurred lasting 4 beats with a max rate of 182 bpm (avg 158 bpm). Isolated SVEs were rare (<1.0%), SVE  Couplets were rare (<1.0%), and no SVE Triplets were present. Isolated VEs were rare (<1.0%), and no VE Couplets or VE Triplets were present.  Predominant rhythm was sinus rhythm with an average rate at 75 bpm.  Slowest sinus bradycardia was 47 bpm which occurred at 2:36 AM on 3/25 and the fastest sinus tachycardia was 149 bpm which occurred at 5:56 AM on 3/26.  There was 1 isolated episode of SVT with average rate at 158 (range 133 - 182).  There were rare isolated PACs, atrial couplets, and isolated PVCs.  There was no heart block, atrial fibrillation, or prolonged sinus pauses.   CT SCANS  CT CORONARY MORPH W/CTA COR W/SCORE 08/28/2021  Addendum 08/28/2021 11:59 AM ADDENDUM REPORT: 08/28/2021 11:56  ADDENDUM: OVER-READ INTERPRETATION  CT CHEST  The following report is an over-read performed by radiologist Dr. Lovie Chol [facility] on 08/28/2021. This over-read does not include interpretation of cardiac or coronary anatomy or pathology. The coronary calcium and coronary CT angiography interpretation by the cardiologist is attached. Imaging of the chest is focused on cardiac structures and excludes much of the chest on CT.  COMPARISON:  None available  FINDINGS:  Cardiovascular: Please see dedicated report for cardiovascular details.  Mediastinum/Nodes: No acute process or signs of adenopathy in the  visualized portions of the mediastinum.  Lungs/Pleura: Visualize lungs are clear and airways are patent.  Upper Abdomen: Incidental imaging of upper abdominal contents shows no acute findings to the extent evaluated.  Musculoskeletal: No acute bone finding. No destructive bone process. Spinal degenerative changes.  IMPRESSION: No acute or significant extracardiac findings.   Electronically Signed By: Donzetta Kohut M.D. On: 08/28/2021 11:56  Narrative CLINICAL DATA:  Chest pain  EXAM: Cardiac/Coronary CTA  TECHNIQUE: A non-contrast, gated CT scan  was obtained with axial slices of 3 mm through the heart for calcium scoring. Calcium scoring was performed using the Agatston method. A 120 kV prospective, gated, contrast cardiac scan was obtained. Gantry rotation speed was 250 msecs and collimation was 0.6 mm. Two sublingual nitroglycerin tablets (0.8 mg) were given. The 3D data set was reconstructed in 5% intervals of the 35-75% of the R-R cycle. Diastolic phases were analyzed on a dedicated workstation using MPR, MIP, and VRT modes. The patient received 95 cc of contrast.  FINDINGS: Image quality: Excellent.  Noise artifact is: Limited.  Coronary Arteries:  Normal coronary origin.  Right dominance.  Left main: The left main is a large caliber vessel with a normal take off from the left coronary cusp that trifurcates into a LAD, LCX, and ramus intermedius. There is no plaque or stenosis.  Left anterior descending artery: The LAD gives off 2 patent diagonal branches. There is mild calcified plaque in the proximal to mid LAD with associated stenosis of 25-49%.  Ramus intermedius: Very small and patent with no evidence of plaque or stenosis.  Left circumflex artery: The LCX is non-dominant and gives off 2 patent obtuse marginal branches. There is mild calcified plaque in the mid LCx with associated stenosis of 25-49%.  Right coronary artery: The RCA is dominant with normal take off from the right coronary cusp. There is no evidence of plaque or stenosis. The RCA terminates as a PDA and right posterolateral branch without evidence of plaque or stenosis.  Right Atrium: Right atrial size is within normal limits.  Right Ventricle: The right ventricular cavity is within normal limits.  Left Atrium: Left atrial size is normal in size with no left atrial appendage filling defect.  Left Ventricle: The ventricular cavity size is within normal limits. There are no stigmata of prior infarction. There is no abnormal filling  defect.  Pulmonary arteries: Normal in size without proximal filling defect.  Pulmonary veins: Normal pulmonary venous drainage.  Pericardium: Normal thickness with no significant effusion or calcium present.  Cardiac valves: The aortic valve is trileaflet without significant calcification. The mitral valve is normal structure without significant calcification.  Aorta: Normal caliber with no significant disease.  Extra-cardiac findings: See attached radiology report for non-cardiac structures.  IMPRESSION: 1. Coronary calcium score of 70.6. This was 47th percentile for age-, sex, and race-matched controls.  2.  Normal coronary origin with right dominance.  3.  Mild atherosclerosis of the LAD and LCx.  CAD RADS 2.  4.  Recommend preventive therapy and risk factor modification.  RECOMMENDATIONS: 1. CAD-RADS 0: No evidence of CAD (0%). Consider non-atherosclerotic causes of chest pain.  2. CAD-RADS 1: Minimal non-obstructive CAD (0-24%). Consider non-atherosclerotic causes of chest pain. Consider preventive therapy and risk factor modification.  3. CAD-RADS 2: Mild non-obstructive CAD (25-49%). Consider non-atherosclerotic causes of chest pain. Consider preventive therapy and risk factor modification.  4. CAD-RADS 3: Moderate stenosis. Consider symptom-guided anti-ischemic pharmacotherapy as well as risk factor modification per guideline directed care.  Additional analysis with CT FFR will be submitted.  5. CAD-RADS 4: Severe stenosis. (70-99% or > 50% left main). Cardiac catheterization or CT FFR is recommended. Consider symptom-guided anti-ischemic pharmacotherapy as well as risk factor modification per guideline directed care. Invasive coronary angiography recommended with revascularization per published guideline statements.  6. CAD-RADS 5: Total coronary occlusion (100%). Consider cardiac catheterization or viability assessment. Consider  symptom-guided anti-ischemic pharmacotherapy as well as risk factor modification per guideline directed care.  7. CAD-RADS N: Non-diagnostic study. Obstructive CAD can't be excluded. Alternative evaluation is recommended.  Armanda Magic, MD  . The  interpretation by the cardiologist is attached.  Electronically Signed: By: Armanda Magic M.D. On: 08/28/2021 11:34     ______________________________________________________________________________________________      Risk Assessment/Calculations:            Physical Exam:   VS:  BP (!) 150/86   Pulse 72   Ht 5\' 9"  (1.753 m)   Wt 251 lb 11.2 oz (114.2 kg)   SpO2 98%   BMI 37.17 kg/m    Wt Readings from Last 3 Encounters:  06/09/23 251 lb 11.2 oz (114.2 kg)  12/05/22 249 lb (112.9 kg)  10/05/22 245 lb (111.1 kg)    GEN: Well nourished, well developed in no acute distress NECK: No JVD; No carotid bruits CARDIAC: RRR, no murmurs, rubs, gallops RESPIRATORY:  Clear to auscultation without rales, wheezing or rhonchi  ABDOMEN: Soft, non-tender, non-distended EXTREMITIES:  No edema; No deformity   ASSESSMENT AND PLAN: .    Chest pain-Given episodes of chest pain plan for cardiac CTA. BMET today.  GDMT includes aspirin, rosuvastatin.  Add nebivolol 5 mg daily.  Previously did not tolerate Imdur.Recommend aiming for 150 minutes of moderate intensity activity per week and following a heart healthy diet.     HLD-continue fish oil, rosuvastatin 20 mg daily.  HTN-BP not at goal less than 130/80.  Continue diltiazem 120 mg daily. Attributes ED to Hydralazine, will change Hydralazine to PRN for SBP >180 Rx Nebivolol 5mg  daily for BP control and CAD benefit.  Discussed to monitor BP at home at least 2 hours after medications and sitting for 5-10 minutes.        Dispo: follow up in May with Dr. Tresa Endo as scheduled  Signed, Alver Sorrow, NP

## 2023-06-10 ENCOUNTER — Encounter (HOSPITAL_BASED_OUTPATIENT_CLINIC_OR_DEPARTMENT_OTHER): Payer: Self-pay

## 2023-06-10 DIAGNOSIS — E875 Hyperkalemia: Secondary | ICD-10-CM

## 2023-06-10 LAB — BASIC METABOLIC PANEL
BUN/Creatinine Ratio: 17 (ref 10–24)
BUN: 17 mg/dL (ref 8–27)
CO2: 22 mmol/L (ref 20–29)
Calcium: 10.2 mg/dL (ref 8.6–10.2)
Chloride: 99 mmol/L (ref 96–106)
Creatinine, Ser: 0.98 mg/dL (ref 0.76–1.27)
Glucose: 83 mg/dL (ref 70–99)
Potassium: 5.3 mmol/L — ABNORMAL HIGH (ref 3.5–5.2)
Sodium: 138 mmol/L (ref 134–144)
eGFR: 84 mL/min/{1.73_m2} (ref >59)

## 2023-06-11 ENCOUNTER — Telehealth (HOSPITAL_BASED_OUTPATIENT_CLINIC_OR_DEPARTMENT_OTHER): Payer: Self-pay

## 2023-06-11 NOTE — Telephone Encounter (Signed)
-----   Message from Alver Sorrow sent at 06/10/2023 11:08 AM EDT ----- Normal kidney function.  Potassium mildly elevated.  Recommend avoidance of electrolyte drinks, potassium supplement, salt substitute as these can increase potassium.   Repeat BMET in 1 week for monitoring.

## 2023-06-11 NOTE — Telephone Encounter (Signed)
 Opened encounter in error. Please disregard.

## 2023-06-15 ENCOUNTER — Encounter (HOSPITAL_BASED_OUTPATIENT_CLINIC_OR_DEPARTMENT_OTHER): Payer: Self-pay | Admitting: Family

## 2023-06-17 ENCOUNTER — Encounter (HOSPITAL_COMMUNITY): Payer: Self-pay

## 2023-06-18 LAB — BASIC METABOLIC PANEL WITH GFR
BUN/Creatinine Ratio: 16 (ref 10–24)
BUN: 16 mg/dL (ref 8–27)
CO2: 23 mmol/L (ref 20–29)
Calcium: 9.4 mg/dL (ref 8.6–10.2)
Chloride: 100 mmol/L (ref 96–106)
Creatinine, Ser: 1.03 mg/dL (ref 0.76–1.27)
Glucose: 117 mg/dL — ABNORMAL HIGH (ref 70–99)
Potassium: 4.3 mmol/L (ref 3.5–5.2)
Sodium: 138 mmol/L (ref 134–144)
eGFR: 79 mL/min/{1.73_m2} (ref >59)

## 2023-06-19 ENCOUNTER — Encounter (HOSPITAL_BASED_OUTPATIENT_CLINIC_OR_DEPARTMENT_OTHER): Payer: Self-pay

## 2023-06-19 ENCOUNTER — Other Ambulatory Visit: Payer: Self-pay | Admitting: Cardiovascular Disease

## 2023-06-19 ENCOUNTER — Ambulatory Visit (HOSPITAL_COMMUNITY)
Admission: RE | Admit: 2023-06-19 | Discharge: 2023-06-19 | Disposition: A | Discharge status: Home / Self Care | Source: Ambulatory Visit | Attending: Family | Admitting: Family

## 2023-06-19 ENCOUNTER — Ambulatory Visit (HOSPITAL_COMMUNITY)
Admission: RE | Admit: 2023-06-19 | Discharge: 2023-06-19 | Disposition: A | Discharge status: Home / Self Care | Source: Ambulatory Visit | Attending: Cardiovascular Disease

## 2023-06-19 DIAGNOSIS — R931 Abnormal findings on diagnostic imaging of heart and coronary circulation: Secondary | ICD-10-CM

## 2023-06-19 DIAGNOSIS — R072 Precordial pain: Secondary | ICD-10-CM | POA: Diagnosis present

## 2023-06-19 DIAGNOSIS — I2510 Atherosclerotic heart disease of native coronary artery without angina pectoris: Secondary | ICD-10-CM

## 2023-06-19 MED ORDER — METOPROLOL TARTRATE 5 MG/5ML IV SOLN: 10.0000 mg | INTRAVENOUS | Status: DC | PRN

## 2023-06-19 MED ORDER — NITROGLYCERIN 0.4 MG SL SUBL
SUBLINGUAL_TABLET | SUBLINGUAL | Status: AC
  Filled 2023-06-19: qty 2

## 2023-06-19 MED ORDER — DILTIAZEM HCL 25 MG/5ML IV SOLN: 10.0000 mg | INTRAVENOUS | Status: DC | PRN

## 2023-06-19 MED ORDER — IOHEXOL 350 MG/ML SOLN
95.0000 mL | Freq: Once | INTRAVENOUS | Status: AC | PRN
  Administered 2023-06-19: 95 mL via INTRAVENOUS

## 2023-06-19 MED ORDER — NITROGLYCERIN 0.4 MG SL SUBL
0.8000 mg | SUBLINGUAL_TABLET | Freq: Once | SUBLINGUAL | Status: AC
  Administered 2023-06-19: 0.8 mg via SUBLINGUAL

## 2023-06-19 NOTE — Progress Notes (Signed)
 CT FFR ordered.  Gerri Spore T. Flora Lipps, MD, [facility]  [facility]  [address], [address] [place], [place] [address] [phone]  7:46 PM

## 2023-06-20 ENCOUNTER — Encounter (HOSPITAL_BASED_OUTPATIENT_CLINIC_OR_DEPARTMENT_OTHER): Payer: Self-pay

## 2023-06-23 ENCOUNTER — Encounter (HOSPITAL_BASED_OUTPATIENT_CLINIC_OR_DEPARTMENT_OTHER): Payer: Self-pay

## 2023-06-23 NOTE — Telephone Encounter (Signed)
BP log as requested 

## 2023-07-10 ENCOUNTER — Other Ambulatory Visit: Payer: Medicare Other

## 2023-07-11 LAB — CBC
Hematocrit: 53.6 % — ABNORMAL HIGH (ref 37.5–51.0)
Hemoglobin: 16.4 g/dL (ref 13.0–17.7)
MCH: 24.4 pg — ABNORMAL LOW (ref 26.6–33.0)
MCHC: 30.6 g/dL — ABNORMAL LOW (ref 31.5–35.7)
MCV: 80 fL (ref 79–97)
Platelets: 317 10*3/uL (ref 150–450)
RBC: 6.71 x10E6/uL — ABNORMAL HIGH (ref 4.14–5.80)
RDW: 18.7 % — ABNORMAL HIGH (ref 11.6–15.4)
WBC: 8.9 10*3/uL (ref 3.4–10.8)

## 2023-07-11 LAB — TESTOSTERONE,FREE AND TOTAL
Testosterone, Free: 3.9 pg/mL — ABNORMAL LOW (ref 6.6–18.1)
Testosterone: 258 ng/dL — ABNORMAL LOW (ref 264–916)

## 2023-07-11 LAB — PSA, TOTAL AND FREE
PSA, Free Pct: 24.8 %
PSA, Free: 1.09 ng/mL
Prostate Specific Ag, Serum: 4.4 ng/mL — ABNORMAL HIGH (ref 0.0–4.0)

## 2023-07-11 LAB — COMPREHENSIVE METABOLIC PANEL WITH GFR
ALT: 22 IU/L (ref 0–44)
AST: 23 IU/L (ref 0–40)
Albumin: 4.3 g/dL (ref 3.9–4.9)
Alkaline Phosphatase: 96 IU/L (ref 44–121)
BUN/Creatinine Ratio: 14 (ref 10–24)
BUN: 15 mg/dL (ref 8–27)
Bilirubin Total: 0.5 mg/dL (ref 0.0–1.2)
CO2: 23 mmol/L (ref 20–29)
Calcium: 9.5 mg/dL (ref 8.6–10.2)
Chloride: 100 mmol/L (ref 96–106)
Creatinine, Ser: 1.06 mg/dL (ref 0.76–1.27)
Globulin, Total: 2.4 g/dL (ref 1.5–4.5)
Glucose: 120 mg/dL — ABNORMAL HIGH (ref 70–99)
Potassium: 5.3 mmol/L — ABNORMAL HIGH (ref 3.5–5.2)
Sodium: 138 mmol/L (ref 134–144)
Total Protein: 6.7 g/dL (ref 6.0–8.5)
eGFR: 76 mL/min/{1.73_m2} (ref >59)

## 2023-07-18 ENCOUNTER — Encounter: Payer: Self-pay | Admitting: Urology

## 2023-07-18 ENCOUNTER — Ambulatory Visit (INDEPENDENT_AMBULATORY_CARE_PROVIDER_SITE_OTHER): Payer: Medicare Other | Admitting: Urology

## 2023-07-18 VITALS — BP 127/72 | HR 79

## 2023-07-18 DIAGNOSIS — C61 Malignant neoplasm of prostate: Secondary | ICD-10-CM | POA: Diagnosis not present

## 2023-07-18 DIAGNOSIS — R351 Nocturia: Secondary | ICD-10-CM | POA: Diagnosis not present

## 2023-07-18 DIAGNOSIS — N400 Enlarged prostate without lower urinary tract symptoms: Secondary | ICD-10-CM

## 2023-07-18 DIAGNOSIS — E291 Testicular hypofunction: Secondary | ICD-10-CM | POA: Diagnosis not present

## 2023-07-18 LAB — URINALYSIS, ROUTINE W REFLEX MICROSCOPIC
Bilirubin, UA: NEGATIVE
Glucose, UA: NEGATIVE
Ketones, UA: NEGATIVE
Leukocytes,UA: NEGATIVE
Nitrite, UA: NEGATIVE
RBC, UA: NEGATIVE
Specific Gravity, UA: 1.03 (ref 1.005–1.030)
Urobilinogen, Ur: 0.2 mg/dL (ref 0.2–1.0)
pH, UA: 6 (ref 5.0–7.5)

## 2023-07-18 LAB — MICROSCOPIC EXAMINATION: Bacteria, UA: NONE SEEN

## 2023-07-18 MED ORDER — TESTOSTERONE CYPIONATE 200 MG/ML IM SOLN: 100.0000 mg | INTRAMUSCULAR | 3 refills | Status: DC

## 2023-07-18 NOTE — Patient Instructions (Signed)

## 2023-07-18 NOTE — Progress Notes (Signed)
 07/18/2023 12:16 PM   ILLIAM GUIMOND 1955-03-23 [id number]  Referring provider: Roxene Cora, PA-C [address] [place],  [place] [address]  Followup hypogonadism   HPI: Mr Balcom is a 68yo here for followup for hypogonadism, BPH, and prostate cancer. PSA 4.4. he injects IM testosterone  100mg  every week. Testosterone  258, hemoglobin 16.4, cmp shows elevated potassium at 5.3. IPSS 5 QOL 2 after Urolift. Nocturia 0-1x. He had an episode of a feeling of incomplete emptying after diving and presented to the ER and was diagnosed with a UTI.    PMH: Past Medical History:  Diagnosis Date   CAD (coronary artery disease) 10/2021   moderate disease in left circumflex artery,   Chest pain, atypical    Erectile dysfunction    2024   H/O echocardiogram 10/04/2011   EF >55%;mild concentric LVH; normals systolic & diastolic dysfunction; mild aortic stenosis   Heart failure with mildly reduced ejection fraction (HFmrEF) (HCC) 2023   History of nuclear stress test 10/04/2011   low risk; small amount of basal inferior bowel artifact   Hyperlipidemia    Hypertension    OSA on CPAP    OSA on CPAP     Surgical History: Past Surgical History:  Procedure Laterality Date   COLONOSCOPY N/A 11/14/2020   Procedure: COLONOSCOPY;  Surgeon: Alanda Allegra, MD;  Location: [facility];  Service: Gastroenterology;  Laterality: N/A;   CYSTOSCOPY WITH INSERTION OF UROLIFT N/A 09/30/2022   Procedure: CYSTOSCOPY WITH INSERTION OF UROLIFT;  Surgeon: Marco Severs, MD;  Location: [facility];  Service: Urology;  Laterality: N/A;    Home Medications:  Allergies as of 07/18/2023   No Known Allergies      Medication List        Accurate as of Jul 18, 2023 12:16 PM. If you have any questions, ask your nurse or doctor.          aspirin  81 MG tablet Take 81 mg by mouth daily.   buPROPion  300 MG 24 hr tablet Commonly known as: WELLBUTRIN  XL Take 300 mg by mouth at bedtime.    busPIRone  15 MG tablet Commonly known as: BUSPAR  Take 15 mg by mouth at bedtime.   cyanocobalamin 1000 MCG tablet Commonly known as: VITAMIN B12 Take 1,000 mcg by mouth daily.   diltiazem  120 MG 24 hr capsule Commonly known as: CARDIZEM  CD TAKE 1 CAPSULE(120 MG) BY MOUTH DAILY   fish oil-omega-3 fatty acids  1000 MG capsule Take 1 g by mouth daily.   hydrALAZINE  25 MG tablet Commonly known as: APRESOLINE  Take 1 tablet (25 mg total) by mouth as needed (Take up to twice per day as needed for systolic blood pressure (top number) more than 180.).   multivitamin with minerals tablet Take 1 tablet by mouth daily.   nebivolol  5 MG tablet Commonly known as: BYSTOLIC  Take 1 tablet (5 mg total) by mouth daily.   nitroGLYCERIN  0.4 MG SL tablet Commonly known as: NITROSTAT  PLACE 1 TABLET UNDER THE TONGUE EVERY 5 MINUTES AS NEEDED FOR CHEST PAIN   NON FORMULARY at bedtime. CPAP   rosuvastatin  20 MG tablet Commonly known as: CRESTOR  TAKE 1 TABLET(20 MG) BY MOUTH DAILY   testosterone  cypionate 200 MG/ML injection Commonly known as: DEPOTESTOSTERONE CYPIONATE Inject 0.5 mLs (100 mg total) into the muscle every 7 (seven) days.        Allergies: No Known Allergies  Family History: Family History  Problem Relation Age of Onset   Heart attack Brother  Social History:  reports that he has quit smoking. He has been exposed to tobacco smoke. He has never used smokeless tobacco. He reports that he does not currently use alcohol. He reports that he does not use drugs.  ROS: All other review of systems were reviewed and are negative except what is noted above in HPI  Physical Exam: BP 127/72   Pulse 79   Constitutional:  Alert and oriented, No acute distress. HEENT: [place] AT, moist mucus membranes.  Trachea midline, no masses. Cardiovascular: No clubbing, cyanosis, or edema. Respiratory: Normal respiratory effort, no increased work of breathing. GI: Abdomen is soft, nontender,  nondistended, no abdominal masses GU: No CVA tenderness.  Lymph: No cervical or inguinal lymphadenopathy. Skin: No rashes, bruises or suspicious lesions. Neurologic: Grossly intact, no focal deficits, moving all 4 extremities. Psychiatric: Normal mood and affect.  Laboratory Data: Lab Results  Component Value Date   WBC 8.9 07/10/2023   HGB 16.4 07/10/2023   HCT 53.6 (H) 07/10/2023   MCV 80 07/10/2023   PLT 317 07/10/2023    Lab Results  Component Value Date   CREATININE 1.06 07/10/2023    Lab Results  Component Value Date   PSA 4.0 10/21/2019   PSA 3.9 03/22/2019    Lab Results  Component Value Date   TESTOSTERONE  258 (L) 07/10/2023    No results found for: "HGBA1C"  Urinalysis    Component Value Date/Time   COLORURINE YELLOW 10/05/2022 1500   APPEARANCEUR Clear 01/17/2023 1208   LABSPEC 1.013 10/05/2022 1500   PHURINE 6.0 10/05/2022 1500   GLUCOSEU Negative 01/17/2023 1208   HGBUR LARGE (A) 10/05/2022 1500   BILIRUBINUR Negative 01/17/2023 1208   KETONESUR NEGATIVE 10/05/2022 1500   PROTEINUR 1+ (A) 01/17/2023 1208   PROTEINUR 100 (A) 10/05/2022 1500   UROBILINOGEN 1.0 10/29/2021 1542   NITRITE Negative 01/17/2023 1208   NITRITE NEGATIVE 10/05/2022 1500   LEUKOCYTESUR Negative 01/17/2023 1208   LEUKOCYTESUR TRACE (A) 10/05/2022 1500    Lab Results  Component Value Date   LABMICR See below: 01/17/2023   WBCUA 0-5 01/17/2023   LABEPIT 0-10 01/17/2023   MUCUS Present (A) 10/16/2022   BACTERIA None seen 01/17/2023    Pertinent Imaging:  No results found for this or any previous visit.  No results found for this or any previous visit.  No results found for this or any previous visit.  No results found for this or any previous visit.  Results for orders placed during the hospital encounter of 04/24/15  US  Renal  Narrative CLINICAL DATA:  Proteinuria  EXAM: RENAL / URINARY TRACT ULTRASOUND COMPLETE  COMPARISON:  None.  FINDINGS: Right  Kidney:  Length: 12.7 cm. Echogenicity within normal limits. No mass or hydronephrosis visualized.  Left Kidney:  Length: 12.0 cm. Echogenicity within normal limits. No mass or hydronephrosis visualized.  Bladder:  Appears normal for degree of bladder distention.  IMPRESSION: Normal renal ultrasound.   Electronically Signed By: Anastasio Balsam M.D. On: 04/24/2015 09:56  No results found for this or any previous visit.  No results found for this or any previous visit.  No results found for this or any previous visit.   Assessment & Plan:    1. Benign prostatic hyperplasia, unspecified whether lower urinary tract symptoms present (Primary) Imporved after Urolift - Urinalysis, Routine w reflex microscopic  2. Prostate cancer (HCC) Followup 6 months with PSA  3. Hypogonadism male Continue IM testosterone  100mg  every week -followup 6 monhts with labs  4.  Nocturia Imporved after Urolift   No follow-ups on file.  Johnie Nailer, MD  [facility] [place]

## 2023-07-23 ENCOUNTER — Ambulatory Visit: Payer: Medicare Other | Attending: Cardiovascular Disease | Admitting: Cardiovascular Disease

## 2023-07-23 ENCOUNTER — Encounter: Payer: Self-pay | Admitting: Cardiovascular Disease

## 2023-07-23 DIAGNOSIS — E785 Hyperlipidemia, unspecified: Secondary | ICD-10-CM

## 2023-07-23 DIAGNOSIS — I428 Other cardiomyopathies: Secondary | ICD-10-CM | POA: Diagnosis present

## 2023-07-23 DIAGNOSIS — I25118 Atherosclerotic heart disease of native coronary artery with other forms of angina pectoris: Secondary | ICD-10-CM | POA: Diagnosis present

## 2023-07-23 DIAGNOSIS — G4733 Obstructive sleep apnea (adult) (pediatric): Secondary | ICD-10-CM | POA: Diagnosis not present

## 2023-07-23 DIAGNOSIS — I10 Essential (primary) hypertension: Secondary | ICD-10-CM

## 2023-07-23 DIAGNOSIS — E66812 Obesity, class 2: Secondary | ICD-10-CM | POA: Insufficient documentation

## 2023-07-23 DIAGNOSIS — I5022 Chronic systolic (congestive) heart failure: Secondary | ICD-10-CM

## 2023-07-23 NOTE — Progress Notes (Signed)
 Patient ID: Stephen Walton, male   DOB: 1955/07/08, 68 y.o.   MRN: [id number]        HPI: Stephen Walton is a 68 y.o. male who presents for an 60-month follow-up cardiology/sleep evaluation.  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 He has a history of obesity, sleep apnea on CPAP therapy. In 2013 he developed episodes of chest pain with some atypical features. This occurred shortly after his brother suffered a cardiac arrest. An echo Doppler study revealed mild concentric LVH with normal systolic and diastolic function. He did have mild aortic valve sclerosis without stenosis. A nuclear perfusion study done on 10/04/2011 showed normal perfusion.  He has the metabolic syndrome. In March 2014 an NMR lipoprofile showed an increase LDL particle number at 1647 with an LDL of 132. He had 736 small LDL particles. His insulin resistance was 53. HDL was 53. Normal HDL particle #37.2. He has been on simvastatin  20 mg since that time. Subsequent blood work showed marked improvement with a total cholesterol 146 triglycerides 114 HDL 49 LDL cholesterol 74. Stephen Walton walks at least 5 miles daily. However, despite this has been difficult for him  to lose weight. He denies recurrent chest pain.   He admits to 100% CPAP use.  He is the father of Stephen Walton, who previously had worked for [employer] in [place].  Over the past 2 years, he has gained approximately 10 additional pounds of weight despite his exercise. He has had recent laboratory done by Dr. R[place]y [place] in [place]. He denies recurrent episodes of chest pain. He denies palpitations. He denies presyncope or syncope.  When I saw him several years ago his brother recently died with a myocardial infarction.  Stephen Walton had gained some weight but had lost 20 poundsafter he  was given phentermine, which assisted  him in his weight loss by a former physician at [facility] and he had seen a nutritionist.  After the physician left the new physician who had taken her place would not reinstitute this therapy.  He continues to walk approximately 5 miles per day at work at [employer].  He had gained significant weight.  When he was placed on Lexapro for depression but due to this.  He stopped taking the Lexapro and lost 20 pounds as noted above.  I saw him, since his CPAP machine was very old, and was not downloadable, I had recommended he undergo a follow-up sleep study.  This was done in October 2016.  During that study , he was felt to have increased upper airway resistance, but did not meet criteria for obstructive sleep apnea.  Last year, he apparently underwent a home study, which he brought with him and I reviewed today.  This showed an AHI of 14.3 with an RDI of 20.5 in June 2017.  Since then he has been on a CPAP auto unit which was supplied by [employer].  He had blood work at [facility] by Dr. Mara Seminole a Tyson Gals which I reviewed, which was obtained last April 2017.  Hemoglobin 16.9, hematocrit 50.5.  BUN 15, creatinine 1.02.  Glucose 90.  Total cholesterol 146, triglycerides 75, HDL 50, LDL 76.  TSH was 1.18.  I saw him in February 2015 he complained of weight gain.  He is now 20 pounds above what he had weighed 2016.  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  His shift is from 6 PM to 6 AM.  He typically walks 5 miles at [facility].  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  He admits to some occasional left ankle swelling.  He was started on 6 Zander for appetite control by Leverne Reading at [facility].  This has resulted in weight loss of approximately 16 pounds.  I saw him in May 2019 at which time he was not sleeping adequate duration due to his night shift.  He continues to use CPAP.  He brought a download from  his app to the office today.  He is averaging 5 hours and 57 minutes of sleep per night.  AHI is excellent at 1.4.  At times he does have a mask leak.  He recently underwent laboratory at [facility] on June 04, 2017.  Hemoglobin was 14.2 hematocrit 44.8.  MCV low at 75.  Renal function was normal with a creatinine 1.09.  Total cholesterol 131, triglycerides 76, HDL 47, LDL 69.  Hemoglobin A1c was 5.8.  He is followed every 6 months by Dr. Joie Narrow.  PSA was 6.7.  He also is on testosterone  injections.  He tells me he has been undergoing blood donation every 2 months.    I evaluated him on September 16, 2018 in a telemedicine visit. At that time [place].  His [place] in the canine unit had died.  He wasusing CPAP therapy.  His supply company is [employer] [facility] in [place].  He has a ResMed CPAP machine and has been using a Quatro full facemask.  He admits compliance.  His daughter Stephen Walton had interrogated his unit and his AHI is 2.48.  Over the last 30 days his average sleep duration was 6 hours and 7 minutes.  He underwent laboratory at work by Dr. Tyson Gals as well as an EKG which apparently showed normal sinus rhythm with a QT interval at 376.  BUN 21 creatinine 1.01.  Most recent lipid studies have shown total cholesterol 174, triglycerides 90, LDL 105, HDL 51 on his current dose of simvastatin .  He denies any chest pain or shortness of breath.  His peak weight was 260 and he has reduced his weight to 237 and continues to be on Saxenda.  He sees Dr. Joie Narrow for his BPH.  At times he does admit to some mild lower extremity fluid retention at the end of the day.   I last saw him in the office on March 30, 2019.  Over the prior 6 months he was without chest pain.  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 He plans to retire at the end of June.  He had just purchased a new [place].  He had recent laboratory at [facility] on March 22, 2019 which showed a  hemoglobin 15.8, hematocrit 50.6, MCV 76.  Creatinine 1.02.  His anxiety is better on medications.  He continues to get supplies from [facility].  I obtained a new download of his CPAP unit.  He is meeting compliance standards with average usage at 6 hours and 18 minutes.  His auto unit was set at a minimum pressure of 5 and maximum of 20, 95th percentile pressure was 11.7 with a maximum average pressure of 13.2.  AHI was 2.1.  He had recently purchased a new ResMed  travel CPAP unit which he plans to use on trips after his retirement.  He has a new Quatro full facemask.    Since I last saw him, he has been evaluated by Ervin Heath, PA on several occasions.  He had experienced some chest pain in June 2023.  A coronary CT showed a calcium  score of 70.6 placing him at the 47th percentile.  An echo cardiogram on September 04, 2021 reportedly showed an EF of 40 to 45%.  He was started on carvedilol  and Entresto .  He was hospitalized with chest pain in August 2023 and was seen by Dr. Chancy Comber and by her review his EF was 50 to 55% on echo and he had 50 to 69% stenosis in the circumflex with nonhemodynamically significant CT FFR assessment.  Subsequently, he had been reevaluated by Ervin Heath.  The day after Thanksgiving, he presented to the hospital after experiencing nocturnal chest pain with marked blood pressure elevation at 210/111.  Chest pain responded to nitroglycerin .  He was hospitalized overnight and seen in consultation with Hao Meng/Dr. Croitoru.  Apparently he has been off his isosorbide  for several months.  His blood pressure normalized quickly in the hospital.  Troponins were negative.  As result, it was recommended that he increase carvedilol  from 3.125 mg twice a day to 6.25 mg twice a day, discontinue low-dose valsartan  and since he was not taking isosorbide  continue to be off of this.  I last saw him on February 12, 2022.  Since hospital discharge on February 09, 2022 he felt well.  He has not filled  the prescription for the increased dose of carvedilol  and did not realize he can double up his 3.125 mg pill and take that twice a day.  As result he presents to the office today on carvedilol  3.125 twice daily.  Of note, he tells me the week before he went to the hospital he had undergone a steroid injection in his left hip.  He continues to be on aspirin .  He is on rosuvastatin  20 mg in addition to omega-3 fatty acid.  He is retired.  He continues to use CPAP for excellent compliance.  A download was obtained from October 29 through February 11, 2022 which shows excellent compliance and benefit with average use at 8 hours and 16 minutes AHI 1.0 with his pressure range set between 10 and 14 cm of water .    Over the last several years, he has been seen by several providers including Leala Prince, PA-C, Pattricia Bores, NP, Ervin Heath, [place] and most recently Neomi Banks, NP on June 09, 2023.  Cardiac CTA in 2023 showed mild CAD.  In 2024, a cardiac monitor showed predominant sinus rhythm with average heart rate at 75 and he did have a 4 beat run of SVT.  He has had some blood pressure lability and in March had experienced an episode of chest pain associated with blood pressure elevation at 160/90 which was relieved with nitroglycerin .  When seen by Neomi Banks, it was recommended he undergo repeat coronary CT a which showed a calcium  score of 91.7 and total plaque volume of 303 mm.  Overall there was moderate CAD with less than 25% plaque in the proximal and mid LAD, 50 to 69% plaque in the mid circumflex, and less than 25% in the RCA.  FFR analysis was normal.  Presently, he denies any recent chest tightness.  He has been on a medical regimen of aspirin  81 mg, Cardizem  CD 120 mg, Nebivolol   5 mg daily, and he has a prescription for hydralazine  25 mg to take as needed blood pressure elevation.  He also was on rosuvastatin  20 mg, omega-3 fatty acid, and testosterone  injection.  He continues to use CPAP and has  an AirSense 10 AutoSet unit with set up date September 26, 2015.  A download from April 5 through Jul 20, 2018 through shows he is meeting compliance with average use of 7 hours and 51 minutes.  His unit is set at a pressure range of 10 to 14 cm with average 90/5% pressure at 12.0.  AHI 0.8.  He presents for cardiology evaluation.   Past Medical History:  Diagnosis Date   CAD (coronary artery disease) 10/2021   moderate disease in left circumflex artery,   Chest pain, atypical    Erectile dysfunction    2024   H/O echocardiogram 10/04/2011   EF >55%;mild concentric LVH; normals systolic & diastolic dysfunction; mild aortic stenosis   Heart failure with mildly reduced ejection fraction (HFmrEF) (HCC) 2023   History of nuclear stress test 10/04/2011   low risk; small amount of basal inferior bowel artifact   Hyperlipidemia    Hypertension    OSA on CPAP    OSA on CPAP     Past Surgical History:  Procedure Laterality Date   COLONOSCOPY N/A 11/14/2020   Procedure: COLONOSCOPY;  Surgeon: Alanda Allegra, MD;  Location: [facility];  Service: Gastroenterology;  Laterality: N/A;   CYSTOSCOPY WITH INSERTION OF UROLIFT N/A 09/30/2022   Procedure: CYSTOSCOPY WITH INSERTION OF UROLIFT;  Surgeon: Marco Severs, MD;  Location: [facility] ORS;  Service: Urology;  Laterality: N/A;    No Known Allergies   Current Outpatient Medications  Medication Sig Dispense Refill   aspirin  81 MG tablet Take 81 mg by mouth daily.     buPROPion  (WELLBUTRIN  XL) 300 MG 24 hr tablet Take 300 mg by mouth at bedtime.     busPIRone  (BUSPAR ) 15 MG tablet Take 15 mg by mouth at bedtime.     cyanocobalamin (VITAMIN B12) 1000 MCG tablet Take 1,000 mcg by mouth daily.     diltiazem  (CARDIZEM  CD) 120 MG 24 hr capsule TAKE 1 CAPSULE(120 MG) BY MOUTH DAILY 90 capsule 3   fish oil-omega-3 fatty acids  1000 MG capsule Take 1 g by mouth daily.     hydrALAZINE  (APRESOLINE ) 25 MG tablet Take 1 tablet (25 mg total) by mouth as needed  (Take up to twice per day as needed for systolic blood pressure (top number) more than 180.). 30 tablet 1   Multiple Vitamins-Minerals (MULTIVITAMIN WITH MINERALS) tablet Take 1 tablet by mouth daily.     nebivolol  (BYSTOLIC ) 5 MG tablet Take 1 tablet (5 mg total) by mouth daily. 30 tablet 2   nitroGLYCERIN  (NITROSTAT ) 0.4 MG SL tablet PLACE 1 TABLET UNDER THE TONGUE EVERY 5 MINUTES AS NEEDED FOR CHEST PAIN 100 tablet 0   NON FORMULARY at bedtime. CPAP     rosuvastatin  (CRESTOR ) 20 MG tablet TAKE 1 TABLET(20 MG) BY MOUTH DAILY 90 tablet 3   testosterone  cypionate (DEPOTESTOSTERONE CYPIONATE) 200 MG/ML injection Inject 0.5 mLs (100 mg total) into the muscle every 7 (seven) days. 5 mL 3   No current facility-administered medications for this visit.    Socially he is married has 4 children. 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 There is no tobacco use having quit over 30 years ago. There is nosignificant alcohol  use  ROS General: Negative; No fevers, chills, or night sweats;  HEENT: Negative; No changes in vision or hearing, sinus congestion, difficulty swallowing Pulmonary: Negative; No cough, wheezing, shortness of breath, hemoptysis Cardiovascular: Negative; No chest pain, presyncope, syncope, palpitations GI: Negative; No nausea, vomiting, diarrhea, or abdominal pain GU: Enlarged prostate, status post biopsy; No dysuria, hematuria, or difficulty voiding Musculoskeletal: Negative; no myalgias, joint pain, or weakness Hematologic/Oncology: Negative; no easy bruising, bleeding Endocrine: Positive for metabolic syndrome; no heat/cold intolerance; no diabetes Neuro: Negative; no changes in balance, headaches Skin: Negative; No rashes or skin lesions Psychiatric: Depression following his brother's death Sleep: Positive for obstructive sleep apnea on CPAP with 100% compliance; recent breakthrough snoring, no daytime sleepiness,  hypersomnolence, bruxism, restless legs, hypnogognic hallucinations, no cataplexy Other comprehensive 14 point system review is negative   Physical Exam BP 124/82   Pulse 71   Ht 5\' 9"  (1.753 m)   Wt 254 lb 3.2 oz (115.3 kg)   SpO2 95%   BMI 37.54 kg/m    Repeat blood pressure by me was   Wt Readings from Last 3 Encounters:  07/23/23 254 lb 3.2 oz (115.3 kg)  06/09/23 251 lb 11.2 oz (114.2 kg)  12/05/22 249 lb (112.9 kg)   General: Alert, oriented, no distress.  Mesomorphic Skin: normal turgor, no rashes, warm and dry; multiple tattoos of his previous canine [place] Shepherd HEENT: Normocephalic, atraumatic. Pupils equal round and reactive to light; sclera anicteric; extraocular muscles intact; Nose without nasal septal hypertrophy Mouth/Parynx benign; Mallinpatti scale 3 Neck: Thick neck no JVD, no carotid bruits; normal carotid upstroke Lungs: clear to ausculatation and percussion; no wheezing or rales Chest wall: without tenderness to palpitation Heart: PMI not displaced, RRR, s1 s2 normal, 1/6 systolic murmur, no diastolic murmur, no rubs, gallops, thrills, or heaves Abdomen: soft, nontender; no hepatosplenomehaly, BS+; abdominal aorta nontender and not dilated by palpation. Back: no CVA tenderness Pulses 2+ Musculoskeletal: full range of motion, normal strength, no joint deformities Extremities: no clubbing cyanosis or edema, Homan's sign negative  Neurologic: grossly nonfocal; Cranial nerves grossly wnl Psychologic: Normal mood and affect  EKG Interpretation Date/Time:  Wednesday Jul 23 2023 11:04:31 EDT Ventricular Rate:  71 PR Interval:  182 QRS Duration:  104 QT Interval:  372 QTC Calculation: 404 R Axis:   91  Text Interpretation: Normal sinus rhythm Rightward axis When compared with ECG of 09-Jun-2023 14:47, No significant change was found Confirmed by Magnus Schuller ([id number]) on 07/23/2023 11:18:09 AM    February 12, 2022 ECG (independently read by me): Normal  sinus rhythm at 72 bpm.  Rightward axis.  Incomplete right bundle branch block.  Normal intervals.  No ectopy  January limits 2, 2021 ECG (independently read by me): Normal sinus rhythm at 79 bpm.  Normal intervals.  No ectopy.  No ST segment changes  May 2019 ECG (independently read by me): Normal sinus rhythm at 69 bpm.  No ectopy.  Normal intervals.  February 2018 ECG (independently read by me): Sinus rhythm at 85 bpm.  Rightward axis.  No signal ST changes.  September 2016 ECG (independently read by me): NSR at 76; normal intervals  ECG (independently read by me): Normal sinus rhythm at 77 beats per minute. No ectopy. Normal intervals.  Prior ECG of 09/28/2012: Sinus rhythm at 61 beats per minute; normal intervals  LABS:  I reviewed recent blood work by Dr. Gladys Lamp, M.D., from 07/14/2014.   Hemoglobin 17.3, hematocrit 51.6.  Fasting glucose 92.  BUN 17, currently 0.9.  Normal LFTs.  Total cluster 153, triglycerides 90, HDL 52, LDL 83.  PSA 2.1.  TSH 1.12.  I also reviewed the blood work from April 2017, which is noted above.  Laboratory from [facility] from June 04, 2017 was reviewed as dictated above in HPI  Laboratory from March 22, 2019 was reviewed  Recent hospitalizations in August and November 2023 and evaluations by Ervin Heath, PA were reviewed  BMET     Latest Ref Rng & Units 07/10/2023    9:25 AM 06/17/2023    2:15 PM 06/09/2023    4:07 PM  BMP  Glucose 70 - 99 mg/dL 161  096  83   BUN 8 - 27 mg/dL 15  16  17    Creatinine 0.76 - 1.27 mg/dL 0.45  4.09  8.11   BUN/Creat Ratio 10 - 24 14  16  17    Sodium 134 - 144 mmol/L 138  138  138   Potassium 3.5 - 5.2 mmol/L 5.3  4.3  5.3   Chloride 96 - 106 mmol/L 100  100  99   CO2 20 - 29 mmol/L 23  23  22    Calcium  8.6 - 10.2 mg/dL 9.5  9.4  91.4        Latest Ref Rng & Units 07/10/2023    9:25 AM 07/04/2022    9:29 AM 12/21/2021    9:31 AM  Hepatic Function  Total Protein 6.0 - 8.5 g/dL 6.7  6.4  6.8    Albumin 3.9 - 4.9 g/dL 4.3  4.1  4.4   AST 0 - 40 IU/L 23  25  19    ALT 0 - 44 IU/L 22  18  20    Alk Phosphatase 44 - 121 IU/L 96  76  86   Total Bilirubin 0.0 - 1.2 mg/dL 0.5  0.6  0.6     CBC     Latest Ref Rng & Units 07/10/2023    9:25 AM 10/05/2022    2:52 PM 07/27/2022    5:52 AM  CBC  WBC 3.4 - 10.8 x10E3/uL 8.9  10.5  8.8   Hemoglobin 13.0 - 17.7 g/dL 78.2  95.6  21.3   Hematocrit 37.5 - 51.0 % 53.6  48.3  42.5   Platelets 150 - 450 x10E3/uL 317  303  314     BNP No results found for: "PROBNP"  Lipid Panel     Component Value Date/Time   CHOL 118 02/08/2022 0547   CHOL 110 05/05/2020 1209   TRIG 61 02/08/2022 0547   HDL 53 02/08/2022 0547   HDL 43 05/05/2020 1209   CHOLHDL 2.2 02/08/2022 0547   VLDL 12 02/08/2022 0547   LDLCALC 53 02/08/2022 0547   LDLCALC 51 05/05/2020 1209     RADIOLOGY: No results found.  IMPRESSION:  1. Coronary artery disease of native artery of native heart with stable angina pectoris (HCC)   2. Primary hypertension   3. OSA on CPAP   4. Hyperlipidemia with target LDL less than 70   5. NICM (nonischemic cardiomyopathy) (HCC)   6. 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  He is had difficulty with weight gain over the past several years.  He has seen a nutritionist and has been trying to carefully watch what he eats.  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  However, since his [place] died he does not have a dog with him.  He retired in June 2021.   He has a history of obstructive sleep apnea, anxiety, obesity, and hyperlipidemia.  He has continued to use CPAP with excellent compliance and a download from October 29 through February 11, 2022 shows average use at 8 hours and 16 minutes per night.  AHI is excellent at 1.0 with his 95th percentile pressure 11.4  maximum average pressure 12.2.  He has had issues with blood pressure and had experienced episodic chest discomfort.  He underwent coronary CTA in August 2023 which showed 50 to 69% stenosis in the circumflex coronary artery with negative FFR.  Echo Doppler study was reinterpreted and showed EF in the 50 to 55% range.  He had been on low-dose valsartan  in addition to carvedilol  3.125 mg twice a day.  Over the last several years, he has seen numerous providers since his last office visit with me in 2023.  He had experienced some recurrent episodes of chest pain leading to repeat CTA which was done on June 19, 2023.  Coronary calcium  score was 91.7 with total plaque volume of 303 mm.  Overall he had mild to moderate CAD with normal FFR as outlined above.  Presently, he is doing well without recurrent anginal symptomatology.  His blood pressure today is stable and repeat by me was 118/78.  He continues to be on diltiazem  CD1 120 mg and Nebivolol  5 mg daily.  He has a prescription for hydralazine  if blood pressure transiently increases.  He continues to be on omega-3 fatty acid and rosuvastatin  for hyperlipidemia.  He has been followed by Leverne Reading, PA-C at [facility] in [place].  He continues to use CPAP therapy with initial set up date July 2017.  He qualifies for new machine and I will schedule him to receive a new ResMed AirSense 11 AutoSet unit.  He uses an AirTouch F20 fullface mask.  I am recommending follow-up laboratory with a fasting lipid panel and LP(a).  I discussed with him my plans for upcoming retirement next month.  He will need to be seen within 3 months of obtaining his new machine for compliance and I will transition him to the sleep care of Dr. Micael Adas.  Since he lives in [place], I will transition his cardiology care to Dr. Armida Lander in our [place] office.  It has been a pleasure taking care of him for these many years.    Millicent Ally, MD, [facility], ABSM Diplomate, American  Board of Sleep Medicine  07/25/2023 10:26 AM

## 2023-07-23 NOTE — Patient Instructions (Signed)
 Medication Instructions:  NO CHANGES *If you need a refill on your cardiac medications before your next appointment, please call your pharmacy*  Lab Work: FASTING LIPID PANEL AND Lpa BEFORE FOLLOW APPOINTMENT If you have labs (blood work) drawn today and your tests are completely normal, you will receive your results only by: MyChart Message (if you have MyChart) OR A paper copy in the mail If you have any lab test that is abnormal or we need to change your treatment, we will call you to review the results.  Testing/Procedures: NO TESTING  Follow-Up: At [facility], you and your health needs are our priority.  As part of our continuing mission to provide you with exceptional heart care, our providers are all part of one team.  This team includes your primary Cardiologist (physician) and Advanced Practice Providers or APPs (Physician Assistants and Nurse Practitioners) who all work together to provide you with the care you need, when you need it.  Your next appointment:   3-4 month(s)  Provider:   Gaylyn Keas, MD and then 6 months with Armida Lander, MD

## 2023-07-30 LAB — LIPID PANEL
Chol/HDL Ratio: 3 ratio (ref 0.0–5.0)
Cholesterol, Total: 118 mg/dL (ref 100–199)
HDL: 40 mg/dL (ref >39)
LDL Chol Calc (NIH): 57 mg/dL (ref 0–99)
Triglycerides: 117 mg/dL (ref 0–149)
VLDL Cholesterol Cal: 21 mg/dL (ref 5–40)

## 2023-07-30 LAB — LIPOPROTEIN A (LPA): Lipoprotein (a): 9.6 nmol/L (ref <75.0)

## 2023-08-05 ENCOUNTER — Ambulatory Visit: Payer: Self-pay | Admitting: Cardiovascular Disease

## 2023-08-07 NOTE — Addendum Note (Signed)
 Addended by: Maceo Sax on: 08/07/2023 03:44 PM   Modules accepted: Orders

## 2023-08-07 NOTE — Progress Notes (Signed)
 New ResMed AirSense 11 AutoSet unit, AirTouch F20 fullface mask, headgear, cushions, filters, heated tubing and water  chamber order sent to [facility].

## 2023-08-19 NOTE — Telephone Encounter (Signed)
 Patient is calling to get clarification on the status of his CPAP supplies. Please advise.

## 2023-08-25 NOTE — Telephone Encounter (Signed)
 Pt calling again due to still not hearing back from out office. Requesting cb

## 2023-08-25 NOTE — Telephone Encounter (Signed)
 Returned a call back to the pt.   Pt states when he saw Dr. Loetta Ringer back in early May, his sleep coordinator came down to the visit to assist him with getting a new CPAP machine and schedule him a 3 month follow-up appt with Dr. Micael Adas (to est for sleep), since Dr. Loetta Ringer will be retiring soon.  Pt states Dr. Loetta Ringer wanted him to get a new CPAP machine followed with a 3 month follow-up appt with Dr. Micael Adas thereafter, per protocol.  Pts appt with Dr. Micael Adas is scheduled for 8/28.  Pt states he has yet to receive his new machine or a call back in regards to getting his new CPAP machine.  Pt would like a call back today from Dr. Arlana Bellini sleep nurse for further assistance with this matter.  Pt is aware I will route this message to our entire sleep team for further review and follow-up with him today, in regards to this matter.   Pt verbalized understanding and agrees with this plan.

## 2023-08-26 NOTE — Telephone Encounter (Addendum)
 New ResMed AirSense 11 AutoSet unit, AirTouch F20 fullface mask, headgear, cushions, filters, heated tubing and water  chamber order sent to [facility].  Fax # [phone]-Zayiah Verbal order given to Zayiah to continue current settings.

## 2023-10-02 ENCOUNTER — Other Ambulatory Visit: Payer: Self-pay

## 2023-10-02 ENCOUNTER — Ambulatory Visit
Admission: EM | Admit: 2023-10-02 | Discharge: 2023-10-02 | Disposition: A | Discharge status: Home / Self Care | Attending: Family Medicine | Admitting: Family Medicine

## 2023-10-02 DIAGNOSIS — M79604 Pain in right leg: Secondary | ICD-10-CM

## 2023-10-02 DIAGNOSIS — M79605 Pain in left leg: Secondary | ICD-10-CM | POA: Diagnosis not present

## 2023-10-02 DIAGNOSIS — R030 Elevated blood-pressure reading, without diagnosis of hypertension: Secondary | ICD-10-CM | POA: Diagnosis not present

## 2023-10-02 DIAGNOSIS — R519 Headache, unspecified: Secondary | ICD-10-CM | POA: Diagnosis not present

## 2023-10-02 MED ORDER — DICLOFENAC SODIUM 1 % EX GEL: 2.0000 g | Freq: Four times a day (QID) | CUTANEOUS | 1 refills | Status: AC

## 2023-10-02 NOTE — ED Triage Notes (Signed)
 Pt reports elevated BP, body aches, cramping x 1 day

## 2023-10-02 NOTE — Discharge Instructions (Addendum)
 Try switching from oral anti-inflammatory pain medications to Voltaren  gel topically to the areas of pain in addition to Tylenol  as needed as oral anti-inflammatory such as ibuprofen and Aleve can elevate your blood pressure.  I am suspicious that between that, the pain you are in and poor sleep secondary to pain this may be the cause of your elevated blood pressures.  Continue taking the hydralazine  as needed per cardiology's instructions, continue monitoring home blood pressure readings and follow-up with your primary care provider for recheck as soon as possible.  Return for worsening symptoms

## 2023-10-02 NOTE — ED Provider Notes (Signed)
 [facility]    CSN: [id number] Arrival date & time: 10/02/23  1016      History   Chief Complaint No chief complaint on file.   HPI Stephen Walton is a 68 y.o. male.   Patient presenting today with 1 day history of left hip pain, bilateral upper leg cramping and pain that kept him up all night.  States he has been taking ibuprofen for several days for the pain with only mild temporary benefit.  States he took his blood pressure in the middle of the night because he could not go back to sleep and notes that it was around 150s over 100 range.  Took blood pressure again around 8 AM and states reading was around 180-190/100s range.  Does have a slight headache in the frontal region, otherwise denies chest pain, shortness of breath, dizziness, palpitations, visual change, mental status changes, extremity weakness numbness or tingling.  In addition to his typical blood pressure medications, took a hydralazine  which he has on hand for as needed use around 8 AM this morning.  Past medical history significant for hypertension, CAD, heart failure, hyperlipidemia, OSA.  He states he does have nitroglycerin  at home and considered using it but he is also taking Cialis and is aware that you cannot take them together.    Past Medical History:  Diagnosis Date   CAD (coronary artery disease) 10/2021   moderate disease in left circumflex artery,   Chest pain, atypical    Erectile dysfunction    2024   H/O echocardiogram 10/04/2011   EF >55%;mild concentric LVH; normals systolic & diastolic dysfunction; mild aortic stenosis   Heart failure with mildly reduced ejection fraction (HFmrEF) (HCC) 2023   History of nuclear stress test 10/04/2011   low risk; small amount of basal inferior bowel artifact   Hyperlipidemia    Hypertension    OSA on CPAP    OSA on CPAP     Patient Active Problem List   Diagnosis Date Noted   Erectile dysfunction 10/14/2022   Unstable angina (HCC)  02/08/2022   Depression 10/20/2021   Benign essential HTN 10/20/2021   GERD (gastroesophageal reflux disease) 10/20/2021   Systolic HF (heart failure) (HCC) 10/20/2021   Diverticulosis, sigmoid    BPH (benign prostatic hyperplasia) 04/26/2020   Nocturia 04/26/2020   Prostate cancer (HCC) 03/24/2019   Hypogonadism male 03/24/2019   Anxiety 12/18/2013   Hyperlipidemia 04/30/2013   Obstructive sleep apnea on CPAP 09/28/2012   Obesity 09/28/2012   Metabolic syndrome 09/28/2012    Past Surgical History:  Procedure Laterality Date   COLONOSCOPY N/A 11/14/2020   Procedure: COLONOSCOPY;  Surgeon: Mavis Anes, MD;  [facility];  Service: Gastroenterology;  Laterality: N/A;   CYSTOSCOPY WITH INSERTION OF UROLIFT N/A 09/30/2022   Procedure: CYSTOSCOPY WITH INSERTION OF UROLIFT;  Surgeon: Sherrilee Belvie CROME, MD;  Location: [facility];  Service: Urology;  Laterality: N/A;       Home Medications    Prior to Admission medications   Medication Sig Start Date End Date Taking? Authorizing Provider  diclofenac  Sodium (VOLTAREN  ARTHRITIS PAIN) 1 % GEL Apply 2 g topically 4 (four) times daily. 10/02/23  Yes Stuart Vernell Norris, PA-C  aspirin  81 MG tablet Take 81 mg by mouth daily.    [provider]  buPROPion  (WELLBUTRIN  XL) 300 MG 24 hr tablet Take 300 mg by mouth at bedtime. 08/18/18   [provider]  busPIRone  (BUSPAR ) 15 MG tablet Take 15 mg  by mouth at bedtime.    [provider]  cyanocobalamin (VITAMIN B12) 1000 MCG tablet Take 1,000 mcg by mouth daily.    [provider]  diltiazem  (CARDIZEM  CD) 120 MG 24 hr capsule TAKE 1 CAPSULE(120 MG) BY MOUTH DAILY 12/25/22   Wittenborn, Barnie, NP  fish oil-omega-3 fatty acids  1000 MG capsule Take 1 g by mouth daily.    [provider]  hydrALAZINE  (APRESOLINE ) 25 MG tablet Take 1 tablet (25 mg total) by mouth as needed (Take up to twice per day as needed for systolic blood pressure (top number)  more than 180.). 06/09/23   Walker, Caitlin S, NP  Multiple Vitamins-Minerals (MULTIVITAMIN WITH MINERALS) tablet Take 1 tablet by mouth daily.    [provider]  nebivolol  (BYSTOLIC ) 5 MG tablet Take 1 tablet (5 mg total) by mouth daily. 06/09/23   Walker, Caitlin S, NP  nitroGLYCERIN  (NITROSTAT ) 0.4 MG SL tablet PLACE 1 TABLET UNDER THE TONGUE EVERY 5 MINUTES AS NEEDED FOR CHEST PAIN 09/18/22   Burnard Debby LABOR, MD  NON FORMULARY at bedtime. CPAP    [provider]  rosuvastatin  (CRESTOR ) 20 MG tablet TAKE 1 TABLET(20 MG) BY MOUTH DAILY 11/04/22   Burnard Debby LABOR, MD  testosterone  cypionate (DEPOTESTOSTERONE CYPIONATE) 200 MG/ML injection Inject 0.5 mLs (100 mg total) into the muscle every 7 (seven) days. 07/18/23   McKenzie, Belvie CROME, MD    Family History Family History  Problem Relation Age of Onset   Heart attack Brother     Social History Social History   Tobacco Use   Smoking status: Former    Passive exposure: Past   Smokeless tobacco: Never   Tobacco comments:    quit 30 years ago.  Vaping Use   Vaping status: Never Used  Substance Use Topics   Alcohol use: Not Currently    Comment: 1-2 beers per month   Drug use: Never     Allergies   Patient has no known allergies.   Review of Systems Review of Systems Per HPI  Physical Exam Triage Vital Signs ED Triage Vitals  Encounter Vitals Group     BP 10/02/23 1022 (!) 183/108     Girls Systolic BP Percentile --      Girls Diastolic BP Percentile --      Boys Systolic BP Percentile --      Boys Diastolic BP Percentile --      Pulse Rate 10/02/23 1022 73     Resp 10/02/23 1022 16     Temp 10/02/23 1022 97.9 F (36.6 C)     Temp Source 10/02/23 1022 Oral     SpO2 10/02/23 1022 97 %     Weight --      Height --      Head Circumference --      Peak Flow --      Pain Score 10/02/23 1025 8     Pain Loc --      Pain Education --      Exclude from Growth Chart --    No data found.  Updated Vital  Signs BP (!) 173/106 (BP Location: Right Arm)   Pulse 73   Temp 97.9 F (36.6 C) (Oral)   Resp 16   SpO2 97%   Visual Acuity Right Eye Distance:   Left Eye Distance:   Bilateral Distance:    Right Eye Near:   Left Eye Near:    Bilateral Near:     Physical Exam  Vitals and nursing note reviewed.  Constitutional:      Appearance: Normal appearance.  HENT:     Head: Atraumatic.     Mouth/Throat:     Mouth: Mucous membranes are moist.  Eyes:     Extraocular Movements: Extraocular movements intact.     Conjunctiva/sclera: Conjunctivae normal.     Pupils: Pupils are equal, round, and reactive to light.  Cardiovascular:     Rate and Rhythm: Normal rate and regular rhythm.  Pulmonary:     Effort: Pulmonary effort is normal.     Breath sounds: Normal breath sounds.  Musculoskeletal:        General: Normal range of motion.     Cervical back: Normal range of motion and neck supple.  Skin:    General: Skin is warm and dry.  Neurological:     General: No focal deficit present.     Mental Status: He is oriented to person, place, and time.     Cranial Nerves: No cranial nerve deficit.     Motor: No weakness.     Gait: Gait normal.  Psychiatric:        Mood and Affect: Mood normal.        Thought Content: Thought content normal.        Judgment: Judgment normal.      UC Treatments / Results  Labs (all labs ordered are listed, but only abnormal results are displayed) Labs Reviewed - No data to display  EKG   Radiology No results found.  Procedures Procedures (including critical care time)  Medications Ordered in UC Medications - No data to display  Initial Impression / Assessment and Plan / UC Course  I have reviewed the triage vital signs and the nursing notes.  Pertinent labs & imaging results that were available during my care of the patient were reviewed by me and considered in my medical decision making (see chart for details).     Vital signs and exam  overall reassuring today apart from elevated blood pressure.  This is potentially related to NSAID use, pain, and poor sleep secondary to pain but discussed continuing as needed hydroxyzine in addition to his typical blood pressure regimen, close monitoring at home, DASH diet, fluids, exercise.  Did also recommend discontinuation of oral NSAIDs and starting Voltaren  gel for his leg pain to see if this helps bring his blood pressures down as well.  Close follow-up with PCP and or cardiologist recommended.  EKG today was reassuring, normal sinus rhythm at 65 bpm without acute ST or T wave changes.  Final Clinical Impressions(s) / UC Diagnoses   Final diagnoses:  Elevated blood pressure reading  Bilateral leg pain  Acute nonintractable headache, unspecified headache type     Discharge Instructions      Try switching from oral anti-inflammatory pain medications to Voltaren  gel topically to the areas of pain in addition to Tylenol  as needed as oral anti-inflammatory such as ibuprofen and Aleve can elevate your blood pressure.  I am suspicious that between that, the pain you are in and poor sleep secondary to pain this may be the cause of your elevated blood pressures.  Continue taking the hydralazine  as needed per cardiology's instructions, continue monitoring home blood pressure readings and follow-up with your primary care provider for recheck as soon as possible.  Return for worsening symptoms    ED Prescriptions     Medication Sig Dispense Auth. Provider   diclofenac  Sodium (VOLTAREN  ARTHRITIS PAIN) 1 % GEL  Apply 2 g topically 4 (four) times daily. 150 g Stuart Vernell Norris, [place]      PDMP not reviewed this encounter.   Stuart Vernell Norris, [place] 10/02/23 1940

## 2023-10-18 ENCOUNTER — Other Ambulatory Visit (HOSPITAL_BASED_OUTPATIENT_CLINIC_OR_DEPARTMENT_OTHER): Payer: Self-pay | Admitting: Family

## 2023-10-21 ENCOUNTER — Other Ambulatory Visit (HOSPITAL_BASED_OUTPATIENT_CLINIC_OR_DEPARTMENT_OTHER): Payer: Self-pay | Admitting: Family

## 2023-10-22 ENCOUNTER — Encounter: Payer: Self-pay | Admitting: Urology

## 2023-10-22 ENCOUNTER — Other Ambulatory Visit: Payer: Self-pay | Admitting: Medical Genetics

## 2023-10-23 NOTE — Telephone Encounter (Signed)
 Please see pt concern below and advise

## 2023-10-24 ENCOUNTER — Other Ambulatory Visit (HOSPITAL_COMMUNITY)
Admission: RE | Admit: 2023-10-24 | Discharge: 2023-10-24 | Disposition: A | Discharge status: Home / Self Care | Payer: Self-pay | Source: Ambulatory Visit | Attending: Oncology | Admitting: Oncology

## 2023-11-03 LAB — GENECONNECT MOLECULAR SCREEN: Genetic Analysis Overall Interpretation: NEGATIVE

## 2023-11-12 NOTE — Progress Notes (Unsigned)
 SLEEP MEDICINE OFFICE VISIT NOTE:   Date:  11/13/2023   ID:  Stephen Walton, DOB 09/14/1955, MRN [id number] The patient was identified using 2 identifiers.  PCP:  Leonce Lucie PARAS, PA-C  Cardiologist:  Debby Sor, MD (Inactive)    Referring MD: Leonce Lucie PARAS, [place]*   Chief Complaint  Patient presents with   Sleep Apnea   Hypertension    History of Present Illness:    Stephen Walton is a 68 y.o. male with a hx of CAD, mild aortic stenosis, HFmrEF, hyperlipidemia, hypertension and obstructive sleep apnea on CPAP.  He was a prior patient of Dr. Joesphine who is now retired and patient is now here to establish care with me.  He has been on CPAP therapy for some time.  He had a sleep study in 2016 he did not meet criteria for OSA and was diagnosed with upper airway resistance.  In 2017 he had a home sleep study which showed an AHI of 14.3/h and RDI of 20.5/h.  He has been on CPAP therapy since then.  His DME is [employer].  He was last seen by Dr. Sor on 07/23/2023 and was using a auto CPAP AirSense 10 with an auto pressure of 10 to 14 cm H2O.  At his last office visit 08/05/2023 Dr. Sor ordered a new ResMed AirSense 11 AutoSet unit with an AirTouch F20 fullface mask.  He is now back for follow-up after getting his new device 3 months ago per insurance requirements.  He is doing well with his PAP device and thinks that he  has gotten used to it.  He tolerates the Airtouch F20 Full face mask and feels the pressure is adequate.  Since going on PAP he  feels rested in the am and has no significant daytime sleepiness.  At times he has dry mouth and nasal dryness in the winter but got a humidifier and that has helped. He denies any nasal congestion. He has noticed some cough since going to CPAP from BIPAP and has phleghm in the am.  He does not think that he snores. An Epworth Sleepiness Scale score was calculated the office today and this endorsed at 6 arguing against residual  daytime sleepiness. Patient denies any episodes of bruxism, restless legs, No gagging hallucinations or cataplectic events.    Past Medical History:  Diagnosis Date   CAD (coronary artery disease) 10/2021   moderate disease in left circumflex artery,   Chest pain, atypical    Erectile dysfunction    2024   H/O echocardiogram 10/04/2011   EF >55%;mild concentric LVH; normals systolic & diastolic dysfunction; mild aortic stenosis   Heart failure with mildly reduced ejection fraction (HFmrEF) (HCC) 2023   History of nuclear stress test 10/04/2011   low risk; small amount of basal inferior bowel artifact   Hyperlipidemia    Hypertension    OSA on CPAP    OSA on CPAP     Past Surgical History:  Procedure Laterality Date   COLONOSCOPY N/A 11/14/2020   Procedure: COLONOSCOPY;  Surgeon: Mavis Anes, MD;  Location: [facility];  Service: Gastroenterology;  Laterality: N/A;   CYSTOSCOPY WITH INSERTION OF UROLIFT N/A 09/30/2022   Procedure: CYSTOSCOPY WITH INSERTION OF UROLIFT;  Surgeon: Sherrilee Belvie CROME, MD;  Location: [facility];  Service: Urology;  Laterality: N/A;    Current Medications: Current Meds  Medication Sig   aspirin  81 MG tablet Take 81 mg by mouth daily.   buPROPion  (WELLBUTRIN   XL) 300 MG 24 hr tablet Take 300 mg by mouth at bedtime.   busPIRone  (BUSPAR ) 15 MG tablet Take 15 mg by mouth at bedtime.   cyanocobalamin (VITAMIN B12) 1000 MCG tablet Take 1,000 mcg by mouth daily.   diclofenac  Sodium (VOLTAREN  ARTHRITIS PAIN) 1 % GEL Apply 2 g topically 4 (four) times daily.   fish oil-omega-3 fatty acids  1000 MG capsule Take 1 g by mouth daily.   hydrALAZINE  (APRESOLINE ) 25 MG tablet Take 1 tablet (25 mg total) by mouth as needed (Take up to twice per day as needed for systolic blood pressure (top number) more than 180.).   Multiple Vitamins-Minerals (MULTIVITAMIN WITH MINERALS) tablet Take 1 tablet by mouth daily.   nebivolol  (BYSTOLIC ) 5 MG tablet TAKE 1 TABLET(5 MG) BY MOUTH  DAILY   nitroGLYCERIN  (NITROSTAT ) 0.4 MG SL tablet PLACE 1 TABLET UNDER THE TONGUE EVERY 5 MINUTES AS NEEDED FOR CHEST PAIN   NON FORMULARY at bedtime. CPAP   rosuvastatin  (CRESTOR ) 20 MG tablet TAKE 1 TABLET(20 MG) BY MOUTH DAILY   tadalafil (CIALIS) 5 MG tablet Take 5 mg by mouth daily.   testosterone  cypionate (DEPOTESTOSTERONE CYPIONATE) 200 MG/ML injection Inject 0.5 mLs (100 mg total) into the muscle every 7 (seven) days.     Allergies:   Patient has no known allergies.   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  Vaping Use   Vaping status: Never Used  Substance and Sexual Activity   Alcohol use: Not Currently    Comment: 1-2 beers per month   Drug use: Never   Sexual activity: Not Currently  Other Topics Concern   Not on file  Social History Narrative   Not on file   Social Drivers of Health   Financial Resource Strain: Low Risk  (08/21/2022)   Overall Financial Resource Strain (CARDIA)    Difficulty of Paying Living Expenses: Not hard at all  Food Insecurity: No Food Insecurity (08/21/2022)   Hunger Vital Sign    Worried About Running Out of Food in the Last Year: Never true    Ran Out of Food in the Last Year: Never true  Transportation Needs: No Transportation Needs (08/21/2022)   PRAPARE - Administrator, Civil Service (Medical): No    Lack of Transportation (Non-Medical): No  Physical Activity: Inactive (08/21/2022)   Exercise Vital Sign    Days of Exercise per Week: 0 days    Minutes of Exercise per Session: 0 min  Stress: No Stress Concern Present (08/21/2022)   [employer] of Occupational Health  - Occupational Stress Questionnaire    Feeling of Stress : Only a little  Social Connections: Socially Integrated (08/21/2022)   Social Connection and Isolation Panel    Frequency of Communication with Friends and Family: More than three times a week    Frequency of Social Gatherings with Friends and Family: More than three times a week    Attends Religious Services: More than 4 times per year    Active Member of [employer] or Organizations: Yes  Attends Banker Meetings: 1 to 4 times per year    Marital Status: Married     Family History: The patient's family history includes Heart attack in his brother.  ROS:   Please see the history of present illness.    ROS  All other systems reviewed and negative.   EKGs/Labs/Other Studies Reviewed:    The following studies were reviewed today: PAP compliance download  Physical Exam:    VS:  BP (!) 164/94   Pulse 63   Ht 5' 9 (1.753 m)   Wt 248 lb 12.8 oz (112.9 kg)   SpO2 97%   BMI 36.74 kg/m     Wt Readings from Last 3 Encounters:  11/13/23 248 lb 12.8 oz (112.9 kg)  07/23/23 254 lb 3.2 oz (115.3 kg)  06/09/23 251 lb 11.2 oz (114.2 kg)    GEN: Well nourished, well developed in no acute distress HEENT: Normal NECK: No JVD; No carotid bruits LYMPHATICS: No lymphadenopathy CARDIAC:RRR, no murmurs, rubs, gallops RESPIRATORY:  Clear to auscultation without rales, wheezing or rhonchi  ABDOMEN: Soft, non-tender, non-distended MUSCULOSKELETAL:  No edema; No deformity  SKIN: Warm and dry NEUROLOGIC:  Alert and oriented x 3 PSYCHIATRIC:  Normal affect   ASSESSMENT:    1. OSA on CPAP   2. Primary hypertension    PLAN:    In order of problems listed above:  OSA - The patient is tolerating PAP therapy well without any problems. The PAP download performed by his DME was personally reviewed and interpreted by me today and showed an AHI of 1.1/hr on auto CPAP from 5 to 20 cm H2O with 77% compliance in using more than 4  hours nightly.  The patient has been using and benefiting from PAP use and will continue to benefit from therapy.  -he has a cough in the am from chest congestion and I have asked him to make an appt to see his PCP about this.  I do not think this is from changing from BiPAP to CPAP  Hypertension - BP mildly elevated on exam today>>BP ranges from 117/62 to 137/43mmHg (during June and July) - Continue Bystolic  5 mg daily with as needed refills - he stopped taking Cardizem  because he was concerned that it may be causing ED>>I reassured him that ED typically not caused by the CCB's but Bystolic  could contribute.  He is now taking Cialis.  Recommend going back on the Cardizem .  If his ED does not improve with the Cialis then he needs to call us  and let us  know. He is supposed to start seeing Dr. Alvan in [place] for cardiac are>>I will get him an appt to followup   Time Spent: 20 minutes total time of encounter, including 15 minutes spent in face-to-face patient care on the date of this encounter. This time includes coordination of care and counseling regarding above mentioned problem list. Remainder of non-face-to-face time involved reviewing chart documents/testing relevant to the patient encounter and documentation in the medical record. I have independently reviewed documentation from referring provider  Medication Adjustments/Labs and Tests Ordered: Current medicines are reviewed at length with the patient today.  Concerns regarding medicines are outlined above.  No orders of the defined types were placed in this encounter.  No orders of the defined types were placed in this encounter.   Signed, Stephen Bihari, MD  11/13/2023 10:03 AM    [facility]

## 2023-11-13 ENCOUNTER — Encounter: Payer: Self-pay | Admitting: Cardiology

## 2023-11-13 ENCOUNTER — Ambulatory Visit: Attending: Cardiology | Admitting: Cardiology

## 2023-11-13 VITALS — BP 164/94 | HR 63 | Ht 69.0 in | Wt 248.8 lb

## 2023-11-13 DIAGNOSIS — I10 Essential (primary) hypertension: Secondary | ICD-10-CM | POA: Insufficient documentation

## 2023-11-13 DIAGNOSIS — G4733 Obstructive sleep apnea (adult) (pediatric): Secondary | ICD-10-CM | POA: Diagnosis present

## 2023-11-13 NOTE — Patient Instructions (Signed)
 Medication Instructions:  Your physician recommends that you continue on your current medications as directed. Please refer to the Current Medication list given to you today.   *If you need a refill on your cardiac medications before your next appointment, please call your pharmacy*  Lab Work: None.  If you have labs (blood work) drawn today and your tests are completely normal, you will receive your results only by: MyChart Message (if you have MyChart) OR A paper copy in the mail If you have any lab test that is abnormal or we need to change your treatment, we will call you to review the results.  Testing/Procedures: None.  Follow-Up: At [facility], you and your health needs are our priority.  As part of our continuing mission to provide you with exceptional heart care, our providers are all part of one team.  This team includes your primary Cardiologist (physician) and Advanced Practice Providers or APPs (Physician Assistants and Nurse Practitioners) who all work together to provide you with the care you need, when you need it.  Your next appointment:   1 year(s)  Provider:   Dr. Wilbert Bihari, MD  Please make an appointment with Dr. Dorn Ross or Laymon Qua PA-C in our [place] office for cardiology follow up.    We recommend signing up for the patient portal called MyChart.  Sign up information is provided on this After Visit Summary.  MyChart is used to connect with patients for Virtual Visits (Telemedicine).  Patients are able to view lab/test results, encounter notes, upcoming appointments, etc.  Non-urgent messages can be sent to your provider as well.   To learn more about what you can do with MyChart, go to [email or web address].   Other Instructions Dr. Bihari has ordered new cpap supplies for you. Someone from your DME company will call you to schedule delivery or pick up.

## 2023-11-20 ENCOUNTER — Telehealth: Payer: Self-pay | Admitting: *Deleted

## 2023-11-20 DIAGNOSIS — G4733 Obstructive sleep apnea (adult) (pediatric): Secondary | ICD-10-CM

## 2023-11-20 DIAGNOSIS — I10 Essential (primary) hypertension: Secondary | ICD-10-CM

## 2023-11-20 DIAGNOSIS — I2510 Atherosclerotic heart disease of native coronary artery without angina pectoris: Secondary | ICD-10-CM

## 2023-11-20 NOTE — Telephone Encounter (Addendum)
 DME=[facility]   Cpap supplies order placed to [facility].

## 2023-11-20 NOTE — Telephone Encounter (Signed)
-----   Message from Nurse Geni E sent at 11/13/2023 10:22 AM EDT ----- Regarding: cpap supplies Hello, Dr. Shlomo would like to order cpap supplies for this patient. Thanks! Geni, RN

## 2023-12-10 ENCOUNTER — Telehealth: Payer: Self-pay | Admitting: Cardiology

## 2023-12-10 MED ORDER — ROSUVASTATIN CALCIUM 20 MG PO TABS: 20.0000 mg | ORAL_TABLET | Freq: Every day | ORAL | 0 refills | Status: DC

## 2023-12-10 NOTE — Telephone Encounter (Signed)
*  STAT* If patient is at the pharmacy, call can be transferred to refill team.   1. Which medications need to be refilled? (please list name of each medication and dose if known)   rosuvastatin  (CRESTOR ) 20 MG tablet   2. Would you like to learn more about the convenience, safety, & potential cost savings by using the [facility]?   3. Are you open to using the [facility] (Type [facility]. ).  4. Which pharmacy/location (including street and city if local pharmacy) is medication to be sent to?  [facility] [phone] - [place], [place] - [address] AT [facility] [address]   5. Do they need a 30 day or 90 day supply?   90 day  Patient stated he still has some medication left.  Patient has appointment scheduled with Dr. Alvan on 12/5.

## 2023-12-10 NOTE — Telephone Encounter (Signed)
 Pt's medication was sent to pt's pharmacy as requested. Confirmation received.

## 2024-01-15 ENCOUNTER — Other Ambulatory Visit

## 2024-01-15 DIAGNOSIS — C61 Malignant neoplasm of prostate: Secondary | ICD-10-CM

## 2024-01-15 DIAGNOSIS — E291 Testicular hypofunction: Secondary | ICD-10-CM

## 2024-01-16 ENCOUNTER — Telehealth: Payer: Self-pay

## 2024-01-16 NOTE — Telephone Encounter (Signed)
 [facility] spoke to [place] to give test code [phone] and diagnosis code C61 for PSA labs performed on 01/15/2024. Norma will be faxed over form to be completed.

## 2024-01-17 LAB — COMPREHENSIVE METABOLIC PANEL WITH GFR
ALT: 21 IU/L (ref 0–44)
AST: 24 IU/L (ref 0–40)
Albumin: 4.4 g/dL (ref 3.9–4.9)
Alkaline Phosphatase: 93 IU/L (ref 47–123)
BUN/Creatinine Ratio: 16 (ref 10–24)
BUN: 18 mg/dL (ref 8–27)
Bilirubin Total: 0.9 mg/dL (ref 0.0–1.2)
CO2: 21 mmol/L (ref 20–29)
Calcium: 9.6 mg/dL (ref 8.6–10.2)
Chloride: 99 mmol/L (ref 96–106)
Creatinine, Ser: 1.11 mg/dL (ref 0.76–1.27)
Globulin, Total: 2.3 g/dL (ref 1.5–4.5)
Glucose: 82 mg/dL (ref 70–99)
Potassium: 5.5 mmol/L — ABNORMAL HIGH (ref 3.5–5.2)
Sodium: 138 mmol/L (ref 134–144)
Total Protein: 6.7 g/dL (ref 6.0–8.5)
eGFR: 72 mL/min/1.73 (ref >59)

## 2024-01-17 LAB — CBC
Hematocrit: 53.2 % — ABNORMAL HIGH (ref 37.5–51.0)
Hemoglobin: 16.1 g/dL (ref 13.0–17.7)
MCH: 24.9 pg — ABNORMAL LOW (ref 26.6–33.0)
MCHC: 30.3 g/dL — ABNORMAL LOW (ref 31.5–35.7)
MCV: 82 fL (ref 79–97)
Platelets: 322 x10E3/uL (ref 150–450)
RBC: 6.47 x10E6/uL — ABNORMAL HIGH (ref 4.14–5.80)
RDW: 18.5 % — ABNORMAL HIGH (ref 11.6–15.4)
WBC: 9.5 x10E3/uL (ref 3.4–10.8)

## 2024-01-17 LAB — PSA: Prostate Specific Ag, Serum: 6.6 ng/mL — ABNORMAL HIGH (ref 0.0–4.0)

## 2024-01-17 LAB — TESTOSTERONE,FREE AND TOTAL
Testosterone, Free: 2.8 pg/mL — ABNORMAL LOW (ref 6.6–18.1)
Testosterone: 173 ng/dL — ABNORMAL LOW (ref 264–916)

## 2024-01-17 LAB — SPECIMEN STATUS REPORT

## 2024-01-23 ENCOUNTER — Ambulatory Visit: Admitting: Urology

## 2024-01-23 VITALS — BP 139/81 | HR 67

## 2024-01-23 DIAGNOSIS — N400 Enlarged prostate without lower urinary tract symptoms: Secondary | ICD-10-CM

## 2024-01-23 DIAGNOSIS — R351 Nocturia: Secondary | ICD-10-CM

## 2024-01-23 DIAGNOSIS — E291 Testicular hypofunction: Secondary | ICD-10-CM | POA: Diagnosis not present

## 2024-01-23 DIAGNOSIS — C61 Malignant neoplasm of prostate: Secondary | ICD-10-CM

## 2024-01-23 LAB — URINALYSIS, ROUTINE W REFLEX MICROSCOPIC
Bilirubin, UA: NEGATIVE
Glucose, UA: NEGATIVE
Ketones, UA: NEGATIVE
Leukocytes,UA: NEGATIVE
Nitrite, UA: NEGATIVE
RBC, UA: NEGATIVE
Specific Gravity, UA: 1.03 (ref 1.005–1.030)
Urobilinogen, Ur: 0.2 mg/dL (ref 0.2–1.0)
pH, UA: 6 (ref 5.0–7.5)

## 2024-01-23 LAB — MICROSCOPIC EXAMINATION
Bacteria, UA: NONE SEEN
RBC, Urine: NONE SEEN /HPF (ref 0–2)
WBC, UA: NONE SEEN /HPF (ref 0–5)

## 2024-01-23 NOTE — Progress Notes (Signed)
 01/23/2024 1:01 PM   Stephen Walton 29-Nov-1955 [id number]  Referring provider: Leonce Lucie PARAS, PA-C [address] [place],  [place] [address]  Followup hypogonadism    HPI: [name] is a 68yo here for followup for prostate cancer, BPH and hypogonadism. IPSS 3 QOl 1 after Urolift. He injects 100mg  every 7 days. Testosterone  173, hemoglobin 16.2. He missed several testosterone  injections recently Energy is good. PSA increased to 6.6 . Last MRI was 2019.    PMH: Past Medical History:  Diagnosis Date   CAD (coronary artery disease) 10/2021   moderate disease in left circumflex artery,   Chest pain, atypical    Erectile dysfunction    2024   H/O echocardiogram 10/04/2011   EF >55%;mild concentric LVH; normals systolic & diastolic dysfunction; mild aortic stenosis   Heart failure with mildly reduced ejection fraction (HFmrEF) (HCC) 2023   History of nuclear stress test 10/04/2011   low risk; small amount of basal inferior bowel artifact   Hyperlipidemia    Hypertension    OSA on CPAP    OSA on CPAP     Surgical History: Past Surgical History:  Procedure Laterality Date   COLONOSCOPY N/A 11/14/2020   Procedure: COLONOSCOPY;  Surgeon: Stephen Anes, MD;  [facility];  Service: Gastroenterology;  Laterality: N/A;   CYSTOSCOPY WITH INSERTION OF UROLIFT N/A 09/30/2022   Procedure: CYSTOSCOPY WITH INSERTION OF UROLIFT;  Surgeon: Stephen Belvie CROME, MD;  Location: [facility];  Service: Urology;  Laterality: N/A;    Home Medications:  Allergies as of 01/23/2024   No Known Allergies      Medication List        Accurate as of January 23, 2024  1:01 PM. If you have any questions, ask your nurse or doctor.          aspirin  81 MG tablet Take 81 mg by mouth daily.   buPROPion  300 MG 24 hr tablet Commonly known as: WELLBUTRIN  XL Take 300 mg by mouth at bedtime.   busPIRone  15 MG tablet Commonly known as: BUSPAR  Take 15 mg by mouth at bedtime.    cyanocobalamin 1000 MCG tablet Commonly known as: VITAMIN B12 Take 1,000 mcg by mouth daily.   diclofenac  Sodium 1 % Gel Commonly known as: Voltaren  Arthritis Pain Apply 2 g topically 4 (four) times daily.   diltiazem  120 MG 24 hr capsule Commonly known as: CARDIZEM  CD TAKE 1 CAPSULE(120 MG) BY MOUTH DAILY   fish oil-omega-3 fatty acids  1000 MG capsule Take 1 g by mouth daily.   hydrALAZINE  25 MG tablet Commonly known as: APRESOLINE  Take 1 tablet (25 mg total) by mouth as needed (Take up to twice per day as needed for systolic blood pressure (top number) more than 180.).   multivitamin with minerals tablet Take 1 tablet by mouth daily.   nebivolol  5 MG tablet Commonly known as: BYSTOLIC  TAKE 1 TABLET(5 MG) BY MOUTH DAILY   nitroGLYCERIN  0.4 MG SL tablet Commonly known as: NITROSTAT  PLACE 1 TABLET UNDER THE TONGUE EVERY 5 MINUTES AS NEEDED FOR CHEST PAIN   NON FORMULARY at bedtime. CPAP   rosuvastatin  20 MG tablet Commonly known as: CRESTOR  Take 1 tablet (20 mg total) by mouth daily.   tadalafil 5 MG tablet Commonly known as: CIALIS Take 5 mg by mouth daily.   testosterone  cypionate 200 MG/ML injection Commonly known as: DEPOTESTOSTERONE CYPIONATE Inject 0.5 mLs (100 mg total) into the muscle every 7 (seven) days.  Allergies: No Known Allergies  Family History: Family History  Problem Relation Age of Onset   Heart attack Brother     Social History:  reports that he has quit smoking. He has been exposed to tobacco smoke. He has never used smokeless tobacco. He reports that he does not currently use alcohol. He reports that he does not use drugs.  ROS: All other review of systems were reviewed and are negative except what is noted above in HPI  Physical Exam: BP 139/81   Pulse 67   Constitutional:  Alert and oriented, No acute distress. HEENT: [place] AT, moist mucus membranes.  Trachea midline, no masses. Cardiovascular: No clubbing, cyanosis, or  edema. Respiratory: Normal respiratory effort, no increased work of breathing. GI: Abdomen is soft, nontender, nondistended, no abdominal masses GU: No CVA tenderness.  Lymph: No cervical or inguinal lymphadenopathy. Skin: No rashes, bruises or suspicious lesions. Neurologic: Grossly intact, no focal deficits, moving all 4 extremities. Psychiatric: Normal mood and affect.  Laboratory Data: Lab Results  Component Value Date   WBC 9.5 01/15/2024   HGB 16.1 01/15/2024   HCT 53.2 (H) 01/15/2024   MCV 82 01/15/2024   PLT 322 01/15/2024    Lab Results  Component Value Date   CREATININE 1.11 01/15/2024    Lab Results  Component Value Date   PSA 4.0 10/21/2019   PSA 3.9 03/22/2019    Lab Results  Component Value Date   TESTOSTERONE  173 (L) 01/15/2024    No results found for: HGBA1C  Urinalysis    Component Value Date/Time   COLORURINE YELLOW 10/05/2022 1500   APPEARANCEUR Clear 07/18/2023 1202   LABSPEC 1.013 10/05/2022 1500   PHURINE 6.0 10/05/2022 1500   GLUCOSEU Negative 07/18/2023 1202   HGBUR LARGE (A) 10/05/2022 1500   BILIRUBINUR Negative 07/18/2023 1202   KETONESUR NEGATIVE 10/05/2022 1500   PROTEINUR 2+ (A) 07/18/2023 1202   PROTEINUR 100 (A) 10/05/2022 1500   UROBILINOGEN 1.0 10/29/2021 1542   NITRITE Negative 07/18/2023 1202   NITRITE NEGATIVE 10/05/2022 1500   LEUKOCYTESUR Negative 07/18/2023 1202   LEUKOCYTESUR TRACE (A) 10/05/2022 1500    Lab Results  Component Value Date   LABMICR See below: 07/18/2023   WBCUA 0-5 07/18/2023   LABEPIT 0-10 07/18/2023   MUCUS Present (A) 10/16/2022   BACTERIA None seen 07/18/2023    Pertinent Imaging:  No results found for this or any previous visit.  No results found for this or any previous visit.  No results found for this or any previous visit.  No results found for this or any previous visit.  Results for orders placed during the hospital encounter of 04/24/15  US  Renal  Narrative CLINICAL  DATA:  Proteinuria  EXAM: RENAL / URINARY TRACT ULTRASOUND COMPLETE  COMPARISON:  None.  FINDINGS: Right Kidney:  Length: 12.7 cm. Echogenicity within normal limits. No mass or hydronephrosis visualized.  Left Kidney:  Length: 12.0 cm. Echogenicity within normal limits. No mass or hydronephrosis visualized.  Bladder:  Appears normal for degree of bladder distention.  IMPRESSION: Normal renal ultrasound.   Electronically Signed By: Carlin Gaskins M.D. On: 04/24/2015 09:56  No results found for this or any previous visit.  No results found for this or any previous visit.  No results found for this or any previous visit.   Assessment & Plan:    1. Prostate cancer (HCC) (Primary) Prostate MRI - Urinalysis, Routine w reflex microscopic  2. Benign prostatic hyperplasia, unspecified whether lower urinary tract symptoms  present Improved after urolift - Urinalysis, Routine w reflex microscopic  3. Hypogonadism male Continue testosterone  100mg  every week Followup 6 monhts with testosterone  labs  4. Nocturia Improved after urolift   No follow-ups on file.  Belvie Clara, MD  [facility] [place]

## 2024-01-27 ENCOUNTER — Encounter: Payer: Self-pay | Admitting: Urology

## 2024-01-27 MED ORDER — TESTOSTERONE CYPIONATE 200 MG/ML IM SOLN: 100.0000 mg | INTRAMUSCULAR | 3 refills | Status: AC

## 2024-01-27 NOTE — Patient Instructions (Signed)
 Hypogonadism, Male  Male hypogonadism is a condition of having a level of testosterone  that is lower than normal. Testosterone  is a chemical, or hormone, that is made mainly in the testicles. In boys, testosterone  is responsible for the development of male characteristics during puberty. These include: Making the penis bigger. Growing and building the muscles. Growing facial hair. Deepening the voice. In adult men, testosterone  is responsible for maintaining: An interest in sex and the ability to have sex. Muscle mass. Sperm production. Red blood cell production. Bone strength. Testosterone  also gives men energy and a sense of well-being. Testosterone  normally decreases as men age and the testicles make less testosterone . Testosterone  levels can vary from man to man. Not all men will have signs and symptoms of low testosterone . Weight, alcohol use, medicines, and certain medical conditions can affect a man's testosterone  level. What are the causes? This condition is caused by: A natural decrease in testosterone  that occurs as a man grows older. This is the main cause of this condition. Use of medicines, such as antidepressants, steroids, and opioids. Diseases and conditions that affect the testicles or the making of testosterone . These include: Injury or damage to the testicles from trauma, cancer, cancer treatment, or infection. Diabetes. Sleep apnea. Genetic conditions that men are born with. Disease of the pituitary gland. This gland is in the brain. It produces hormones. Obesity. Metabolic syndrome. This is a group of diseases that affect blood pressure, blood sugar, cholesterol, and belly fat. HIV or AIDS. Alcohol abuse. Kidney failure. Other long-term or chronic diseases. What are the signs or symptoms? Common symptoms of this condition include: Loss of interest in sex (low sex drive). Inability to have or maintain an erection (erectile dysfunction). Feeling tired  (fatigue). Mood changes, like irritability or depression. Loss of muscle and body hair. Infertility. Large breasts. Weight gain (obesity). How is this diagnosed? Your health care provider can diagnose hypogonadism based on: Your signs and symptoms. A physical exam to check your testosterone  levels. This includes blood tests. Testosterone  levels can change throughout the day. Levels are highest in the morning. You may need to have repeat blood tests before getting a diagnosis of hypogonadism. Depending on your medical history and test results, your health care provider may also do other tests to find the cause of low testosterone . How is this treated? This condition is treated with testosterone  replacement therapy. Testosterone  can be given by: Injection or through pellets inserted under the skin. Gels or patches placed on the skin or in the mouth. Testosterone  therapy is not for everyone. It has risks and side effects. Your health care provider will consider your medical history, your risk for prostate cancer, your age, and your symptoms before putting you on testosterone  replacement therapy. Follow these instructions at home: Take over-the-counter and prescription medicines only as told by your health care provider. Eat foods that are high in fiber, such as beans, whole grains, and fresh fruits and vegetables. Limit foods that are high in fat and processed sugars, such as fried or sweet foods. If you drink alcohol: Limit how much you have to 0-2 drinks a day. Know how much alcohol is in your drink. In the [place] one drink equals one 12 oz bottle of beer (355 mL), one 5 oz glass of wine (148 mL), or one 1 oz glass of hard liquor (44 mL). Return to your normal activities as told by your health care provider. Ask your health care provider what activities are safe for you. Keep all  follow-up visits. This is important. Contact a health care provider if: You have any of the signs or symptoms of  low testosterone . You have any side effects from testosterone  therapy. Summary Male hypogonadism is a condition of having a level of testosterone  that is lower than normal. The natural drop in testosterone  production that occurs with age is the most common cause of this condition. Low testosterone  can also be caused by many diseases and conditions that affect the testicles and the making of testosterone . This condition is treated with testosterone  replacement therapy. There are risks and side effects of testosterone  therapy. Your health care provider will consider your age, medical history, symptoms, and risks for prostate cancer before putting you on testosterone  therapy. This information is not intended to replace advice given to you by your health care provider. Make sure you discuss any questions you have with your health care provider. Document Revised: 02/10/2023 Document Reviewed: 02/10/2023 Elsevier Patient Education  2025 [employer].

## 2024-01-30 ENCOUNTER — Ambulatory Visit (HOSPITAL_COMMUNITY)
Admission: RE | Admit: 2024-01-30 | Discharge: 2024-01-30 | Disposition: A | Discharge status: Home / Self Care | Source: Ambulatory Visit | Attending: Urology | Admitting: Urology

## 2024-01-30 DIAGNOSIS — C61 Malignant neoplasm of prostate: Secondary | ICD-10-CM | POA: Diagnosis present

## 2024-01-30 MED ORDER — GADOBUTROL 1 MMOL/ML IV SOLN
10.0000 mL | Freq: Once | INTRAVENOUS | Status: AC | PRN
  Administered 2024-01-30: 10 mL via INTRAVENOUS

## 2024-02-10 ENCOUNTER — Ambulatory Visit: Payer: Self-pay

## 2024-02-20 ENCOUNTER — Ambulatory Visit: Attending: Cardiology | Admitting: Cardiology

## 2024-02-20 ENCOUNTER — Encounter: Payer: Self-pay | Admitting: Cardiology

## 2024-02-20 VITALS — BP 142/82 | HR 76 | Ht 69.0 in | Wt 241.0 lb

## 2024-02-20 DIAGNOSIS — I5020 Unspecified systolic (congestive) heart failure: Secondary | ICD-10-CM

## 2024-02-20 DIAGNOSIS — E782 Mixed hyperlipidemia: Secondary | ICD-10-CM | POA: Diagnosis present

## 2024-02-20 DIAGNOSIS — I2510 Atherosclerotic heart disease of native coronary artery without angina pectoris: Secondary | ICD-10-CM

## 2024-02-20 DIAGNOSIS — I10 Essential (primary) hypertension: Secondary | ICD-10-CM

## 2024-02-20 NOTE — Progress Notes (Signed)
 Clinical Summary Stephen Walton is a 68 y.o.male previously followed by Dr Burnard, this is our first visit together. Seen today for the following medical problems  1.Nonobstructive CAD - Myoview 09/2011 normal perfusion.  - 08/2021 echo: LVEF 40-45%, no WMAs, normal diastolic function 08/2021 coronary CTA: mild LAD and LCX disease 06/2023 coronary CTA: calcium  score 91.7, 46th perecentile. Moderate LCX disease, mild LAD and RCA disease. Disease negative by FFR  - has not tolerated imdur  in the past - from notes in the past chest pain improved with PPI - no recent symptoms.    2. OSA - followed by Dr Shlomo.  - compliant with meds  3. HLD - 07/2023 TC 118 TG 117 HDL 40 LDL 57  4. HTN - he had stopped cardizem  as he had concerns about ED, discussed with Dr Shlomo at last appt with recs to restart and low likelihood for association.  - previously changed from coreg  to metoprolol  without change in ED - had some improvement off norvasc  per notes, he later discontineud - ED worst with valsartan .  - thought ED was related to hydralazine   - home bp's 120s-140s/70s-80s - reports now on current regimen ED is doing ok.  - mild elevated K at times.   5. HFmrEF - - 08/2021 echo: LVEF 40-45%, no WMAs, normal diastolic function - from notes entresto  was too expensive - occasional left leg swelling, no SOB/DOE  6.Palpitations - 06/2022 monitor isolated 4 beat run SVT, rare ectopy.  - denies recent palpitations    Past Medical History:  Diagnosis Date   CAD (coronary artery disease) 10/2021   moderate disease in left circumflex artery,   Chest pain, atypical    Erectile dysfunction    2024   H/O echocardiogram 10/04/2011   EF >55%;mild concentric LVH; normals systolic & diastolic dysfunction; mild aortic stenosis   Heart failure with mildly reduced ejection fraction (HFmrEF) (HCC) 2023   History of nuclear stress test 10/04/2011   low risk; small amount of basal inferior bowel  artifact   Hyperlipidemia    Hypertension    OSA on CPAP    OSA on CPAP      No Known Allergies   Current Outpatient Medications  Medication Sig Dispense Refill   aspirin  81 MG tablet Take 81 mg by mouth daily.     buPROPion  (WELLBUTRIN  XL) 300 MG 24 hr tablet Take 300 mg by mouth at bedtime.     busPIRone  (BUSPAR ) 15 MG tablet Take 15 mg by mouth at bedtime.     cyanocobalamin (VITAMIN B12) 1000 MCG tablet Take 1,000 mcg by mouth daily.     diclofenac  Sodium (VOLTAREN  ARTHRITIS PAIN) 1 % GEL Apply 2 g topically 4 (four) times daily. 150 g 1   diltiazem  (CARDIZEM  CD) 120 MG 24 hr capsule TAKE 1 CAPSULE(120 MG) BY MOUTH DAILY 90 capsule 3   fish oil-omega-3 fatty acids  1000 MG capsule Take 1 g by mouth daily.     hydrALAZINE  (APRESOLINE ) 25 MG tablet Take 1 tablet (25 mg total) by mouth as needed (Take up to twice per day as needed for systolic blood pressure (top number) more than 180.). 30 tablet 1   Multiple Vitamins-Minerals (MULTIVITAMIN WITH MINERALS) tablet Take 1 tablet by mouth daily.     nebivolol  (BYSTOLIC ) 5 MG tablet TAKE 1 TABLET(5 MG) BY MOUTH DAILY 90 tablet 2   NON FORMULARY at bedtime. CPAP     rosuvastatin  (CRESTOR ) 20 MG tablet Take 1  tablet (20 mg total) by mouth daily. 90 tablet 0   Semaglutide (OZEMPIC, 0.25 OR 0.5 MG/DOSE, [place])      tadalafil (CIALIS) 5 MG tablet Take 5 mg by mouth daily.     testosterone  cypionate (DEPOTESTOSTERONE CYPIONATE) 200 MG/ML injection Inject 0.5 mLs (100 mg total) into the muscle every 7 (seven) days. 5 mL 3   nitroGLYCERIN  (NITROSTAT ) 0.4 MG SL tablet PLACE 1 TABLET UNDER THE TONGUE EVERY 5 MINUTES AS NEEDED FOR CHEST PAIN 100 tablet 0   No current facility-administered medications for this visit.     Past Surgical History:  Procedure Laterality Date   COLONOSCOPY N/A 11/14/2020   Procedure: COLONOSCOPY;  Surgeon: Mavis Anes, MD;  [facility];  Service: Gastroenterology;  Laterality: N/A;   CYSTOSCOPY WITH  INSERTION OF UROLIFT N/A 09/30/2022   Procedure: CYSTOSCOPY WITH INSERTION OF UROLIFT;  Surgeon: Sherrilee Belvie CROME, MD;  Location: [facility];  Service: Urology;  Laterality: N/A;     No Known Allergies    Family History  Problem Relation Age of Onset   Heart attack Brother      Social History Stephen Walton reports that he has quit smoking. He has been exposed to tobacco smoke. He has never used smokeless tobacco. Stephen Walton reports that he does not currently use alcohol.   Physical Examination Today's Vitals   02/20/24 0830 02/20/24 0903  BP: (!) 142/82 (!) 142/82  Pulse: 76   SpO2: 97%   Weight: 241 lb (109.3 kg)   Height: 5' 9 (1.753 m)    Body mass index is 35.59 kg/m.  Gen: resting comfortably, no acute distress HEENT: no scleral icterus, pupils equal round and reactive, no palptable cervical adenopathy,  CV: RRR, no m/rg, no jvd Resp: Clear to auscultation bilaterally GI: abdomen is soft, non-tender, non-distended, normal bowel sounds, no hepatosplenomegaly MSK: extremities are warm, no edema.  Skin: warm, no rash Neuro:  no focal deficits Psych: appropriate affect      Assessment and Plan  1.Chest pain/CAD - recent nuclear stress without evidence of obstructive disease - no recent symptoms - continue to monitor  2. HTN - reasonable control, particularly given limitations on medications due to multiple reportedly worsending erectile dysfunction, he also has borderline elevated potassium as well - home numbers reasonable, continue current meds  3. HLD - at goal, continue current meds  4. HFmrEF - repeat echo, may need adjustement in regimen if ongoing dysfunction. Consider SGLT2i. Could not afford ARNI, with his K would look to avoid as well ARNI/ARB/MRA. Could be a candidate for SGLT2i  5. Palpitations - no symptoms, continue current meds      Dorn PHEBE Ross, M.D.

## 2024-02-20 NOTE — Patient Instructions (Signed)
 Medication Instructions:   Your physician recommends that you continue on your current medications as directed. Please refer to the Current Medication list given to you today.   Labwork: None today  Testing/Procedures: Your physician has requested that you have an echocardiogram. Echocardiography is a painless test that uses sound waves to create images of your heart. It provides your doctor with information about the size and shape of your heart and how well your heart's chambers and valves are working. This procedure takes approximately one hour. There are no restrictions for this procedure. Please do NOT wear cologne, perfume, aftershave, or lotions (deodorant is allowed). Please arrive 15 minutes prior to your appointment time.  Please note: We ask at that you not bring children with you during ultrasound (echo/ vascular) testing. Due to room size and safety concerns, children are not allowed in the ultrasound rooms during exams. Our front office staff cannot provide observation of children in our lobby area while testing is being conducted. An adult accompanying a patient to their appointment will only be allowed in the ultrasound room at the discretion of the ultrasound technician under special circumstances. We apologize for any inconvenience.   Follow-Up:  6 months Dr.Branch  Any Other Special Instructions Will Be Listed Below (If Applicable).  If you need a refill on your cardiac medications before your next appointment, please call your pharmacy.

## 2024-03-17 ENCOUNTER — Ambulatory Visit (HOSPITAL_COMMUNITY)
Admission: RE | Admit: 2024-03-17 | Discharge: 2024-03-17 | Disposition: A | Discharge status: Home / Self Care | Source: Ambulatory Visit | Attending: Family Medicine | Admitting: Family Medicine

## 2024-03-17 DIAGNOSIS — I5020 Unspecified systolic (congestive) heart failure: Secondary | ICD-10-CM | POA: Insufficient documentation

## 2024-03-17 DIAGNOSIS — I428 Other cardiomyopathies: Secondary | ICD-10-CM

## 2024-03-17 LAB — ECHOCARDIOGRAM COMPLETE
Area-P 1/2: 3.85 cm2
Calc EF: 58.4 %
S' Lateral: 3.1 cm
Single Plane A2C EF: 57.8 %
Single Plane A4C EF: 59.6 %

## 2024-03-17 NOTE — Progress Notes (Signed)
*  PRELIMINARY RESULTS* Echocardiogram 2D Echocardiogram has been performed.  Stephen Walton 03/17/2024, 10:23 AM

## 2024-03-19 ENCOUNTER — Telehealth: Payer: Self-pay | Admitting: Cardiology

## 2024-03-19 ENCOUNTER — Other Ambulatory Visit: Payer: Self-pay | Admitting: Cardiology

## 2024-03-19 NOTE — Telephone Encounter (Signed)
" °*  STAT* If patient is at the pharmacy, call can be transferred to refill team.   1. Which medications need to be refilled? (please list name of each medication and dose if known)   rosuvastatin  (CRESTOR ) 20 MG tablet     2. Would you like to learn more about the convenience, safety, & potential cost savings by using the [facility]? No    3. Are you open to using the [facility] (Type [facility]. No    4. Which pharmacy/location (including street and city if local pharmacy) is medication to be sent to? [facility] [phone] - [place], [place] - [address] AT [facility] [address]     5. Do they need a 30 day or 90 day supply? 90 day   Pt is out of medication   "

## 2024-03-20 ENCOUNTER — Ambulatory Visit: Payer: Self-pay | Admitting: Cardiology

## 2024-03-23 NOTE — Telephone Encounter (Signed)
 This has already been taken care of as of 03/19/24.

## 2024-04-23 ENCOUNTER — Ambulatory Visit
Admission: EM | Admit: 2024-04-23 | Discharge: 2024-04-23 | Disposition: A | Discharge status: Home / Self Care | Source: Home / Self Care

## 2024-04-23 DIAGNOSIS — R350 Frequency of micturition: Secondary | ICD-10-CM

## 2024-04-23 LAB — POCT URINE DIPSTICK
Bilirubin, UA: NEGATIVE
Blood, UA: NEGATIVE
Glucose, UA: NEGATIVE mg/dL
Ketones, POC UA: NEGATIVE mg/dL
Nitrite, UA: NEGATIVE
POC PROTEIN,UA: 100 — AB
Spec Grav, UA: >=1.03 — AB
Urobilinogen, UA: 1 U/dL
pH, UA: 6.5

## 2024-04-23 NOTE — Discharge Instructions (Signed)
 A urine culture has been ordered.  You will be contacted when the results of the urine culture are received.  You will also have access to the results via MyChart. You may take over-the-counter Tylenol  as needed for pain or discomfort. Develop a toileting schedule that will allow you to urinate at least every 2 hours. Increase your fluid intake. Avoid caffeine such as tea, soda, or coffee while symptoms persist. If the results of your urine culture are negative and you continue to experience symptoms, recommend follow-up with urology for further evaluation. Follow-up as needed.

## 2024-04-23 NOTE — ED Triage Notes (Signed)
 Pt reports he has frequent urination x 2 days

## 2024-04-23 NOTE — ED Provider Notes (Signed)
 " [facility]    CSN: [id number] Arrival date & time: 04/23/24  1943      History   Chief Complaint No chief complaint on file.   HPI Stephen Walton is a 69 y.o. male.   The history is provided by the patient.   Patient presents for a 2-day history of urinary frequency, weak urine stream, and difficulty starting and stopping his stream.  Patient denies fever, chills, dysuria, flank pain, hematuria, nausea, vomiting, or diarrhea.  Patient reports a prior history of prostate cancer.  States that he does have a urosling.  States that he does not have a history of recurrent urinary tract infections.  Patient states he is currently followed by urology.  Patient states that he took a home UTI test which showed positive for nitrites.  Past Medical History:  Diagnosis Date   CAD (coronary artery disease) 10/2021   moderate disease in left circumflex artery,   Chest pain, atypical    Erectile dysfunction    2024   H/O echocardiogram 10/04/2011   EF >55%;mild concentric LVH; normals systolic & diastolic dysfunction; mild aortic stenosis   Heart failure with mildly reduced ejection fraction (HFmrEF) (HCC) 2023   History of nuclear stress test 10/04/2011   low risk; small amount of basal inferior bowel artifact   Hyperlipidemia    Hypertension    OSA on CPAP    OSA on CPAP     Patient Active Problem List   Diagnosis Date Noted   Erectile dysfunction 10/14/2022   Unstable angina (HCC) 02/08/2022   Depression 10/20/2021   Benign essential HTN 10/20/2021   GERD (gastroesophageal reflux disease) 10/20/2021   Systolic HF (heart failure) (HCC) 10/20/2021   Diverticulosis, sigmoid    BPH (benign prostatic hyperplasia) 04/26/2020   Nocturia 04/26/2020   Prostate cancer (HCC) 03/24/2019   Hypogonadism male 03/24/2019   Anxiety 12/18/2013   Hyperlipidemia 04/30/2013   Obstructive sleep apnea on CPAP 09/28/2012   Obesity 09/28/2012   Metabolic syndrome 09/28/2012     Past Surgical History:  Procedure Laterality Date   COLONOSCOPY N/A 11/14/2020   Procedure: COLONOSCOPY;  Surgeon: Mavis Anes, MD;  Location: [facility];  Service: Gastroenterology;  Laterality: N/A;   CYSTOSCOPY WITH INSERTION OF UROLIFT N/A 09/30/2022   Procedure: CYSTOSCOPY WITH INSERTION OF UROLIFT;  Surgeon: Sherrilee Belvie CROME, MD;  Location: [facility];  Service: Urology;  Laterality: N/A;       Home Medications    Prior to Admission medications  Medication Sig Start Date End Date Taking? Authorizing Provider  aspirin  81 MG tablet Take 81 mg by mouth daily.    [provider]  buPROPion  (WELLBUTRIN  XL) 300 MG 24 hr tablet Take 300 mg by mouth at bedtime. 08/18/18   [provider]  busPIRone  (BUSPAR ) 15 MG tablet Take 15 mg by mouth at bedtime.    [provider]  cyanocobalamin (VITAMIN B12) 1000 MCG tablet Take 1,000 mcg by mouth daily.    [provider]  diclofenac  Sodium (VOLTAREN  ARTHRITIS PAIN) 1 % GEL Apply 2 g topically 4 (four) times daily. 10/02/23   Stuart Vernell Norris, PA-C  diltiazem  (CARDIZEM  CD) 120 MG 24 hr capsule TAKE 1 CAPSULE(120 MG) BY MOUTH DAILY 12/25/22   Loistine Sober, NP  fish oil-omega-3 fatty acids  1000 MG capsule Take 1 g by mouth daily.    [provider]  hydrALAZINE  (APRESOLINE ) 25 MG tablet Take 1 tablet (25 mg total) by mouth as needed (Take  up to twice per day as needed for systolic blood pressure (top number) more than 180.). 06/09/23   Walker, Caitlin S, NP  Multiple Vitamins-Minerals (MULTIVITAMIN WITH MINERALS) tablet Take 1 tablet by mouth daily.    [provider]  nebivolol  (BYSTOLIC ) 5 MG tablet TAKE 1 TABLET(5 MG) BY MOUTH DAILY 10/22/23   Walker, Caitlin S, NP  nitroGLYCERIN  (NITROSTAT ) 0.4 MG SL tablet PLACE 1 TABLET UNDER THE TONGUE EVERY 5 MINUTES AS NEEDED FOR CHEST PAIN 09/18/22   Burnard Debby LABOR, MD  NON FORMULARY at bedtime. CPAP    [provider]  rosuvastatin   (CRESTOR ) 20 MG tablet TAKE 1 TABLET(20 MG) BY MOUTH DAILY 03/19/24   Alvan Dorn FALCON, MD  Semaglutide (OZEMPIC, 0.25 OR 0.5 MG/DOSE, [place])  12/17/23   [provider]  tadalafil (CIALIS) 5 MG tablet Take 5 mg by mouth daily. 10/18/23   [provider]  testosterone  cypionate (DEPOTESTOSTERONE CYPIONATE) 200 MG/ML injection Inject 0.5 mLs (100 mg total) into the muscle every 7 (seven) days. 01/27/24   McKenzie, Belvie CROME, MD    Family History Family History  Problem Relation Age of Onset   Heart attack Brother     Social History Social History[1]   Allergies   Patient has no known allergies.   Review of Systems Review of Systems Per HPI  Physical Exam Triage Vital Signs ED Triage Vitals [04/23/24 1948]  Encounter Vitals Group     BP (!) 181/102     Girls Systolic BP Percentile      Girls Diastolic BP Percentile      Boys Systolic BP Percentile      Boys Diastolic BP Percentile      Pulse Rate 80     Resp 18     Temp (!) 97.5 F (36.4 C)     Temp Source Oral     SpO2 97 %     Weight      Height      Head Circumference      Peak Flow      Pain Score 0     Pain Loc      Pain Education      Exclude from Growth Chart    No data found.  Updated Vital Signs BP (!) 174/90   Pulse 80   Temp (!) 97.5 F (36.4 C) (Oral)   Resp 18   SpO2 97%   Visual Acuity Right Eye Distance:   Left Eye Distance:   Bilateral Distance:    Right Eye Near:   Left Eye Near:    Bilateral Near:     Physical Exam Vitals and nursing note reviewed.  Constitutional:      General: He is not in acute distress.    Appearance: Normal appearance.  HENT:     Head: Normocephalic.  Eyes:     Extraocular Movements: Extraocular movements intact.     Pupils: Pupils are equal, round, and reactive to light.  Cardiovascular:     Rate and Rhythm: Normal rate and regular rhythm.  Pulmonary:     Effort: Pulmonary effort is normal. No respiratory distress.     Breath sounds:  Normal breath sounds. No stridor. No wheezing, rhonchi or rales.  Abdominal:     General: Bowel sounds are normal.     Palpations: Abdomen is soft.     Tenderness: There is no abdominal tenderness. There is no right CVA tenderness or left CVA tenderness.  Musculoskeletal:     Cervical  back: Normal range of motion.  Skin:    General: Skin is warm and dry.  Neurological:     General: No focal deficit present.     Mental Status: He is alert and oriented to person, place, and time.  Psychiatric:        Mood and Affect: Mood normal.        Behavior: Behavior normal.      UC Treatments / Results  Labs (all labs ordered are listed, but only abnormal results are displayed) Labs Reviewed  POCT URINE DIPSTICK - Abnormal; Notable for the following components:      Result Value   Spec Grav, UA >=1.030 (*)    POC PROTEIN,UA =100 (*)    Leukocytes, UA Trace (*)    All other components within normal limits    EKG   Radiology No results found.  Procedures Procedures (including critical care time)  Medications Ordered in UC Medications - No data to display  Initial Impression / Assessment and Plan / UC Course  I have reviewed the triage vital signs and the nursing notes.  Pertinent labs & imaging results that were available during my care of the patient were reviewed by me and considered in my medical decision making (see chart for details).  Urinalysis is positive for protein and trace leukocytes, no nitrites found on the exam.  Urine culture is pending.  Patient with underlying history of prostate cancer with a urolift. Per review of the chart, patient with prior history of nocturia which he reports this evening that was improved after placement of the UroLift.  Supportive care recommendations were provided and discussed with the patient while the urine culture is pending to include developing a toileting schedule and avoiding caffeine.  Patient was advised if the urine culture  result is negative and he is continue to experience symptoms, recommend follow-up with urology for further evaluation.  Patient was in agreement with this plan of care and verbalizes understanding.  All questions were answered.  Patient stable for discharge.   Final Clinical Impressions(s) / UC Diagnoses   Final diagnoses:  None   Discharge Instructions   None    ED Prescriptions   None    PDMP not reviewed this encounter.    [1]  Social History Tobacco Use   Smoking status: Former    Passive exposure: Past   Smokeless tobacco: Never   Tobacco comments:    quit 30 years ago.  Vaping Use   Vaping status: Never Used  Substance Use Topics   Alcohol use: Not Currently    Comment: 1-2 beers per month   Drug use: Never     Gilmer Etta PARAS, NP 04/23/24 2005  "

## 2024-07-20 ENCOUNTER — Other Ambulatory Visit

## 2024-07-26 ENCOUNTER — Ambulatory Visit: Admitting: Urology
